# Patient Record
Sex: Female | Born: 1945 | Race: White | Hispanic: No | Marital: Married | Smoking: Never smoker
Health system: Southern US, Community
[De-identification: ages and names within clinical notes are randomized; demographics above are authoritative.]

## PROBLEM LIST (undated history)

## (undated) DIAGNOSIS — R55 Syncope and collapse: Secondary | ICD-10-CM

## (undated) DIAGNOSIS — Z8619 Personal history of other infectious and parasitic diseases: Secondary | ICD-10-CM

## (undated) DIAGNOSIS — M25551 Pain in right hip: Secondary | ICD-10-CM

## (undated) DIAGNOSIS — B159 Hepatitis A without hepatic coma: Secondary | ICD-10-CM

## (undated) DIAGNOSIS — C801 Malignant (primary) neoplasm, unspecified: Secondary | ICD-10-CM

## (undated) DIAGNOSIS — Z974 Presence of external hearing-aid: Secondary | ICD-10-CM

## (undated) DIAGNOSIS — M25512 Pain in left shoulder: Secondary | ICD-10-CM

## (undated) DIAGNOSIS — N814 Uterovaginal prolapse, unspecified: Secondary | ICD-10-CM

## (undated) DIAGNOSIS — M1990 Unspecified osteoarthritis, unspecified site: Secondary | ICD-10-CM

## (undated) DIAGNOSIS — Z9889 Other specified postprocedural states: Secondary | ICD-10-CM

## (undated) DIAGNOSIS — M67929 Unspecified disorder of synovium and tendon, unspecified upper arm: Secondary | ICD-10-CM

## (undated) DIAGNOSIS — U071 COVID-19: Secondary | ICD-10-CM

## (undated) DIAGNOSIS — Z9289 Personal history of other medical treatment: Secondary | ICD-10-CM

## (undated) DIAGNOSIS — M75102 Unspecified rotator cuff tear or rupture of left shoulder, not specified as traumatic: Secondary | ICD-10-CM

## (undated) HISTORY — DX: Unspecified rotator cuff tear or rupture of left shoulder, not specified as traumatic: M75.102

## (undated) HISTORY — DX: Presence of external hearing-aid: Z97.4

## (undated) HISTORY — DX: Malignant (primary) neoplasm, unspecified: C80.1

## (undated) HISTORY — DX: Other specified postprocedural states: Z98.890

## (undated) HISTORY — PX: TONSILLECTOMY AND ADENOIDECTOMY: SUR1326

## (undated) HISTORY — DX: Personal history of other infectious and parasitic diseases: Z86.19

## (undated) HISTORY — DX: COVID-19: U07.1

## (undated) HISTORY — DX: Syncope and collapse: R55

## (undated) HISTORY — DX: Uterovaginal prolapse, unspecified: N81.4

## (undated) HISTORY — PX: REPLACEMENT TOTAL KNEE: SUR1224

## (undated) HISTORY — DX: Personal history of other medical treatment: Z92.89

## (undated) HISTORY — DX: Pain in right hip: M25.551

## (undated) HISTORY — DX: Unspecified disorder of synovium and tendon, unspecified upper arm: M67.929

## (undated) HISTORY — DX: Pain in left shoulder: M25.512

## (undated) HISTORY — PX: TUBAL LIGATION: SHX77

## (undated) HISTORY — DX: Hepatitis a without hepatic coma: B15.9

## (undated) HISTORY — DX: Unspecified osteoarthritis, unspecified site: M19.90

## (undated) HISTORY — PX: MOHS SURGERY: SUR867

---

## 1966-10-27 HISTORY — PX: OVARIAN CYST SURGERY: SHX726

## 1966-10-27 HISTORY — PX: APPENDECTOMY: SHX54

## 1996-10-27 HISTORY — PX: BREAST BIOPSY: SHX20

## 1999-10-28 HISTORY — PX: ROTATOR CUFF REPAIR: SHX139

## 2000-06-03 ENCOUNTER — Ambulatory Visit (HOSPITAL_BASED_OUTPATIENT_CLINIC_OR_DEPARTMENT_OTHER): Admission: RE | Admit: 2000-06-03 | Discharge: 2000-06-04 | Payer: Self-pay | Admitting: Orthopedic Surgery

## 2000-12-23 ENCOUNTER — Other Ambulatory Visit: Admission: RE | Admit: 2000-12-23 | Discharge: 2000-12-23 | Payer: Self-pay | Admitting: Obstetrics and Gynecology

## 2002-02-02 ENCOUNTER — Other Ambulatory Visit: Admission: RE | Admit: 2002-02-02 | Discharge: 2002-02-02 | Payer: Self-pay | Admitting: Obstetrics and Gynecology

## 2003-03-09 ENCOUNTER — Other Ambulatory Visit: Admission: RE | Admit: 2003-03-09 | Discharge: 2003-03-09 | Payer: Self-pay | Admitting: Obstetrics and Gynecology

## 2005-02-07 ENCOUNTER — Ambulatory Visit: Payer: Self-pay | Admitting: Sports Medicine

## 2005-06-03 ENCOUNTER — Ambulatory Visit: Payer: Self-pay | Admitting: Sports Medicine

## 2005-11-28 ENCOUNTER — Ambulatory Visit: Payer: Self-pay | Admitting: Sports Medicine

## 2006-03-12 ENCOUNTER — Other Ambulatory Visit: Admission: RE | Admit: 2006-03-12 | Discharge: 2006-03-12 | Payer: Self-pay | Admitting: Obstetrics & Gynecology

## 2009-01-12 ENCOUNTER — Other Ambulatory Visit: Admission: RE | Admit: 2009-01-12 | Discharge: 2009-01-12 | Payer: Self-pay | Admitting: Obstetrics and Gynecology

## 2011-03-14 NOTE — Op Note (Signed)
[place]  Patient:    Laura Curtis, Laura Curtis                         MRN: [id number] Proc. Date: 06/03/00 Adm. Date:  [id number] Attending:  Twana First                           Operative Report  PREOPERATIVE DIAGNOSES: 1. Status post right shoulder dislocation with subscapularis tendon tear. 2. Right shoulder biceps tendon dislocation.  POSTOPERATIVE DIAGNOSES: 1. Status post right shoulder dislocation with subscapularis tendon tear. 2. Right shoulder biceps tendon dislocation. 3. Right shoulder partial supraspinatus tendon tear.  PROCEDURE: 1. Right shoulder EUA followed by arthroscopic debridement. 2. Right shoulder open subscapularis tendon repair. 3. Right shoulder biceps tenodesis. 4. Right shoulder partial supraspinatus tendon repair. 5. Right shoulder subacromial decompression. 6. Right shoulder distal clavicle excision.  SURGEON:  Elana Alm. Thurston Hole, M.D.  ASSISTANT:  Kirstin Curtis, P.A.  ANESTHESIA:  General.  OPERATIVE TIME:  One hour 40 minutes.  COMPLICATIONS:  None.  INDICATIONS FOR PROCEDURE:  Laura Curtis is a 65 year old woman who sustained a right shoulder dislocation approximately one month ago, with MRI documenting a subscapularis tendon rupture; also, a biceps tendon dislocation; also, impingement and AC joint spurring, and is now to undergo arthroscopy, biceps tenodesis, and subscapularis tendon repair.  DESCRIPTION:  Laura Curtis is brought to the operating room on June 03, 2000 after a supraclavicular block had been placed in the holding room.  She was placed on the operative table in supine position.  After being placed under general anesthesia, her right shoulder was examined under anesthesia.  She had forward flexion to 170, abduction to 160, internal/external rotation decreased to 50 degrees, and a gentle manipulation was carried out, improving range of motion to full.  She had minimal subluxability  noted on the shoulder.  At this point, the shoulder was prepped using sterile Betadine and draped using sterile technique.  She received Ancef 1 g IV preoperatively for prophylaxis.  Initially, through a posterior arthroscopic portal, the arthroscope with a pump attached was placed, and through an anterior portal an arthroscopic probe was placed.  On initial inspection, the articular cartilage and the glenohumeral joint was intact except for a posterior Hill-Sachs lesion.  Her biceps tendon was dislocated medially.  Her subscapularis was ruptured off the lessor tuberosity.  She had a partial 50% undersurface tear of the supraspinatus.  This was partially debrided, as well.  The anterior and inferior glenohumeral ligament complex and anterior and inferior labrum was intact.  The middle and superior labrum was intact.  After the debridement was carried out, then the subacromial space was entered, subtotal bursectomy carried out, subacromial decompression carried out, CA ligament release carried out, and then arthroscopically-assisted distal clavicle excision for significant degenerative changes and spurring in this joint.  At this point, then a 5-7 cm anterior deltoid incision was made for exposure to the anterior aspect of the joint.  The underlying subcutaneous tissues were incised in line with the skin incision, the cephalic vein carefully retracted and exposed, and the deltopectoral groove entered.  Conjoint tendon was carefully protected medially, with protection of musculocutaneous nerve.  The subscapularis tendon rupture was found to be completely ruptured off the lessor tuberosity with dislocation of the biceps medially.  It was felt that with Laura Curtis age and activity level that trying to preserve the biceps tendon  would not be justified.  It would be more functional for her to have a biceps tenodesis, and thus this was performed, cutting the biceps at the level of its  attachment to the superior glenoid and then placing multiple mattress #2 sutures through it, and then making a small keyhole in the bicipital groove and tying this down.  After this was done, then the same sutures were then also used to put through the subscapularis tendon medially and tying this down to the lesser tuberosity.  An Arthrex suture anchor was placed in the lesser tuberosity as well, and each of these #2 sutures were placed through the subscapularis tendon also and tied down also, thus further securing the subscapularis tendon back down to the lessor tuberosity.  After this was done, the shoulder could be brought through a range of motion with no impingement on the repair.  The wound was irrigated and then closed using 2-0 Vicryl and 3-0 Prolene.  Steri-Strips were applied.  Sterile dressings were applied and a sling, and then the patient awakened and taken to the recovery room in a stable condition.  Needle and sponge counts correct x 2 at the end of the case.  FOLLOW-UP CARE:  Laura Curtis will be followed overnight in the recovery care center for IV pain control and neurovascular monitoring.  Discharge tomorrow on Percocet for pain, see her back in my office in a week for sutures out and follow-up. DD:  06/03/00 TD:  06/03/00 Job: [id number]

## 2011-12-31 ENCOUNTER — Ambulatory Visit (INDEPENDENT_AMBULATORY_CARE_PROVIDER_SITE_OTHER): Payer: Medicare Other | Admitting: Sports Medicine

## 2011-12-31 VITALS — BP 114/76 | Ht 64.0 in | Wt 130.0 lb

## 2011-12-31 DIAGNOSIS — M25551 Pain in right hip: Secondary | ICD-10-CM | POA: Insufficient documentation

## 2011-12-31 DIAGNOSIS — M25512 Pain in left shoulder: Secondary | ICD-10-CM

## 2011-12-31 DIAGNOSIS — M25559 Pain in unspecified hip: Secondary | ICD-10-CM

## 2011-12-31 DIAGNOSIS — M25519 Pain in unspecified shoulder: Secondary | ICD-10-CM

## 2011-12-31 HISTORY — DX: Pain in right hip: M25.551

## 2011-12-31 HISTORY — DX: Pain in left shoulder: M25.512

## 2011-12-31 MED ORDER — MELOXICAM 7.5 MG PO TABS: ORAL_TABLET | ORAL | Status: AC

## 2011-12-31 NOTE — Assessment & Plan Note (Signed)
Her examination was not particularly remarkable. With this in mind -- We gave her a rotator cuff rehabilitation program that she can start at home after doing simple motion exercises. She will use moderate resistance thereband.  Mobic once daily for 2 weeks  If she does not see a reduction in pain in 2 weeks I would like her to return for ultrasound of the shoulder

## 2011-12-31 NOTE — Progress Notes (Signed)
  Subjective:    Patient ID: Laura Curtis, female    DOB: [birth year], 66 y.o.   MRN: [id number]  HPI Patient enters complaining of right hip pain This occurred after she was sitting on the floor doing some painting She placed this about twice a week and then noted some pain particularly when going to the right She has less pain on her backhand stroke and on turning to her left  Left shoulder pain seemed to occur spontaneously a couple months ago She has had a right rotator cuff tear and surgery and states that the tightness feels similar She does get some nighttime pain but not severe Aleve did not offer much pain relief   Review of Systems     Objective:   Physical Exam  No acute distress  Right hip shows a total of 60 of rotational motion with 25 of internal rotation Left hip shows 80 of rotational motion with 30 of internal rotation The sacroiliac joints move well Test is completely normal on the left and on the right is normal to flexion adduction and rotation. It is very weak to abduction  Left shoulder exam shows a full range of motion but she feels some tightness and mild pain at full elevation and full flexion Strength is good on internal and external rotation Strength is good abduction Initiation of abduction and empty can position did cause some mild pain      Assessment & Plan:

## 2011-12-31 NOTE — Assessment & Plan Note (Signed)
She is given a series of standard hip exercises to work particularly on her abduction strength and some of her hip rotation strength  In addition she has significant tightness and pain with crossover stretch so we will begin this  She should do this exercises faithfully for 6 weeks and then recheck

## 2012-01-14 ENCOUNTER — Ambulatory Visit (INDEPENDENT_AMBULATORY_CARE_PROVIDER_SITE_OTHER): Payer: Medicare Other | Admitting: Sports Medicine

## 2012-01-14 VITALS — BP 100/60 | Ht 64.0 in | Wt 130.0 lb

## 2012-01-14 DIAGNOSIS — M75102 Unspecified rotator cuff tear or rupture of left shoulder, not specified as traumatic: Secondary | ICD-10-CM

## 2012-01-14 DIAGNOSIS — M25519 Pain in unspecified shoulder: Secondary | ICD-10-CM

## 2012-01-14 DIAGNOSIS — M67929 Unspecified disorder of synovium and tendon, unspecified upper arm: Secondary | ICD-10-CM

## 2012-01-14 DIAGNOSIS — S43429A Sprain of unspecified rotator cuff capsule, initial encounter: Secondary | ICD-10-CM

## 2012-01-14 DIAGNOSIS — M7520 Bicipital tendinitis, unspecified shoulder: Secondary | ICD-10-CM

## 2012-01-14 DIAGNOSIS — M25512 Pain in left shoulder: Secondary | ICD-10-CM

## 2012-01-14 HISTORY — DX: Unspecified disorder of synovium and tendon, unspecified upper arm: M67.929

## 2012-01-14 HISTORY — DX: Unspecified rotator cuff tear or rupture of left shoulder, not specified as traumatic: M75.102

## 2012-01-14 MED ORDER — NITROGLYCERIN 0.2 MG/HR TD PT24: MEDICATED_PATCH | TRANSDERMAL | Status: DC

## 2012-01-14 NOTE — Progress Notes (Signed)
  Subjective:    Patient ID: Laura Curtis, female    DOB: 1946-02-05, 66 y.o.   MRN: [id number]  HPI Patient returns to clinic with worsening left shoulder pain. She states pain has been present for about 9 weeks now.she has been doing her exercises as provided before but believes the pain is worse today. She cannot abduct her arm. She is taking Mobic without any relief. She is icing daily.   Review of Systems No fevers or chills.     Objective:   Physical Exam Shoulder Exam: Left shoulder Appearance: Normal Pain on palpation: Inferior shoulder.  None to [facility] joint or humeral head.  ROM: Abduction Decreased ROM due to pain.   Internal and external rotation good without pain, strength 4/5.   Empty Can:    Hawkin's: some pain causing limitation  Neers: uanble to complete due to pain   Speeds:unable to complete due to pain Yergason's test: unable to complete due to pain Compression:  No pain   MSK U/S:  Left shoulder:  Small tear noted Right supraspinatus/ bursal surface and subscap tendons. In subscap this looks indistinct and degenrative with some calcification but there is increased doppler flow.   Edema noted around biceps tendon with some peripheral tearing of fibers.  Infraspinatus and teres minor WNL.  AC joint normal.      Assessment & Plan:

## 2012-01-14 NOTE — Assessment & Plan Note (Signed)
Will gradually add some strength work Avoid lifting

## 2012-01-14 NOTE — Assessment & Plan Note (Signed)
This is worsening and no response to [facility]  OK to use aleve   Add some easy motion exercises but stop RC strength work until reck

## 2012-01-14 NOTE — Progress Notes (Deleted)
  Subjective:    Patient ID: Laura Curtis, female    DOB: [birth year], 66 y.o.   MRN: [id number]  HPI  Left shoulder pain:  Followup since previous visit.  Worsening in pain and decreased range of motion.  She has been   Review of Systems     Objective:   Physical Exam  Shoulder Exam:  Appearan     Assessment & Plan:

## 2012-01-15 ENCOUNTER — Encounter: Payer: Self-pay | Admitting: Sports Medicine

## 2012-02-16 ENCOUNTER — Telehealth: Payer: Self-pay

## 2012-02-16 NOTE — Telephone Encounter (Signed)
Tell her that I would prefer that she followup in the appointment center of [facility] with Dr. Audria Nine to establish care for someone who is in the Medicare age group/let her know i'm focusing my practice on adolescents now

## 2012-02-16 NOTE — Telephone Encounter (Signed)
Message for Dr. Merla Riches- This patient would like to schedule a complete physical with you and she hasn't seen you since September of 2009 (one time) and once in 2003 for an OV.  Please advise if this patient should be added to your appointment schedule for a physical.

## 2012-02-16 NOTE — Telephone Encounter (Signed)
Pull chart please 

## 2012-02-16 NOTE — Telephone Encounter (Signed)
Chart in your box 

## 2012-04-15 ENCOUNTER — Ambulatory Visit (INDEPENDENT_AMBULATORY_CARE_PROVIDER_SITE_OTHER): Payer: Medicare Other | Admitting: Emergency Medicine

## 2012-04-15 VITALS — BP 112/76 | HR 69 | Temp 97.8°F | Resp 16 | Ht 65.0 in | Wt 135.0 lb

## 2012-04-15 DIAGNOSIS — IMO0002 Reserved for concepts with insufficient information to code with codable children: Secondary | ICD-10-CM

## 2012-04-15 DIAGNOSIS — R55 Syncope and collapse: Secondary | ICD-10-CM

## 2012-04-15 LAB — POCT CBC
Granulocyte percent: 64.9 %G (ref 37–80)
Hemoglobin: 14.1 g/dL (ref 12.2–16.2)
MCH, POC: 29.4 pg (ref 27–31.2)
MCV: 89.1 fL (ref 80–97)
MPV: 7 fL (ref 0–99.8)
POC MID %: 5.8 %M (ref 0–12)
RBC: 4.8 M/uL (ref 4.04–5.48)
WBC: 6.4 10*3/uL (ref 4.6–10.2)

## 2012-04-15 LAB — COMPREHENSIVE METABOLIC PANEL
ALT: 11 U/L (ref 0–35)
Albumin: 4 g/dL (ref 3.5–5.2)
CO2: 29 mEq/L (ref 19–32)
Calcium: 9.6 mg/dL (ref 8.4–10.5)
Chloride: 102 mEq/L (ref 96–112)
Creat: 0.65 mg/dL (ref 0.50–1.10)
Potassium: 4 mEq/L (ref 3.5–5.3)

## 2012-04-15 LAB — TSH: TSH: 3.088 u[IU]/mL (ref 0.350–4.500)

## 2012-04-15 NOTE — Progress Notes (Signed)
Subjective:    Patient ID: Laura Curtis, female    DOB: [birth year], 66 y.o.   MRN: [id number]  HPI patient enters with an episode on Sunday when she was out early in the morning work in the yard and leaned over and had but she feels was a syncopal episode. Since then she has felt foggy in her head. She just does not feel quite right. She has had no further syncopal episodes but she does feel weak. She denies any chest pain or palpitations the she denies any GI symptoms she is not currently on any medications.    Review of Systems     Objective:   Physical Exam  Constitutional: She is oriented to person, place, and time. She appears well-developed and well-nourished.  HENT:  Head: Normocephalic.  Eyes: EOM are normal. Pupils are equal, round, and reactive to light.  Neck: No thyromegaly present.  Cardiovascular: Normal rate, regular rhythm and intact distal pulses.  Exam reveals friction rub. Exam reveals no gallop.   No murmur heard. Pulmonary/Chest: Effort normal and breath sounds normal. No respiratory distress. She has no wheezes. She has no rales. She exhibits no tenderness.  Neurological: She is alert and oriented to person, place, and time. She has normal reflexes. No cranial nerve deficit. Coordination normal.  Psychiatric: She has a normal mood and affect. Her behavior is normal.   EKG normal sinus rhythm no acute change   Results for orders placed in visit on 04/15/12  POCT CBC      Component Value Range   WBC 6.4  4.6 - 10.2 K/uL   Lymph, poc 1.9  0.6 - 3.4   POC LYMPH PERCENT 29.3  10 - 50 %L   MID (cbc) 0.4  0 - 0.9   POC MID % 5.8  0 - 12 %M   POC Granulocyte 4.2  2 - 6.9   Granulocyte percent 64.9  37 - 80 %G   RBC 4.80  4.04 - 5.48 M/uL   Hemoglobin 14.1  12.2 - 16.2 g/dL   HCT, POC 78.2  95.6 - 47.9 %   MCV 89.1  80 - 97 fL   MCH, POC 29.4  27 - 31.2 pg   MCHC 32.9  31.8 - 35.4 g/dL   RDW, POC 21.3     Platelet Count, POC 325  142 - 424 K/uL   MPV 7.0  0  - 99.8 fL   Results for orders placed in visit on 04/15/12  POCT CBC      Component Value Range   WBC 6.4  4.6 - 10.2 K/uL   Lymph, poc 1.9  0.6 - 3.4   POC LYMPH PERCENT 29.3  10 - 50 %L   MID (cbc) 0.4  0 - 0.9   POC MID % 5.8  0 - 12 %M   POC Granulocyte 4.2  2 - 6.9   Granulocyte percent 64.9  37 - 80 %G   RBC 4.80  4.04 - 5.48 M/uL   Hemoglobin 14.1  12.2 - 16.2 g/dL   HCT, POC 08.6  57.8 - 47.9 %   MCV 89.1  80 - 97 fL   MCH, POC 29.4  27 - 31.2 pg   MCHC 32.9  31.8 - 35.4 g/dL   RDW, POC 46.9     Platelet Count, POC 325  142 - 424 K/uL   MPV 7.0  0 - 99.8 fL  GLUCOSE, POCT (MANUAL RESULT ENTRY)  Component Value Range   POC Glucose 84  70 - 99 mg/dl      Assessment & Plan:    Patient had a syncopal episode on Sunday. She has not felt well since that time her blood pressure is consistently low and not truly orthostatic on her she could have some type of autonomic dysfunction. I will refer her to Dr. Myrtis Ser cardiologist for his evaluation of this we'll also check a CT of the head to be sure there is no CNS pathology.. routine labs were done

## 2012-04-19 ENCOUNTER — Other Ambulatory Visit: Payer: Medicare Other

## 2012-04-19 ENCOUNTER — Telehealth: Payer: Self-pay | Admitting: Emergency Medicine

## 2012-04-19 ENCOUNTER — Encounter: Payer: Self-pay | Admitting: *Deleted

## 2012-04-19 ENCOUNTER — Ambulatory Visit
Admission: RE | Admit: 2012-04-19 | Discharge: 2012-04-19 | Disposition: A | Discharge status: Home / Self Care | Payer: Medicare Other | Source: Ambulatory Visit | Attending: Emergency Medicine | Admitting: Emergency Medicine

## 2012-04-19 ENCOUNTER — Encounter: Payer: Self-pay | Admitting: Cardiovascular Disease

## 2012-04-19 DIAGNOSIS — IMO0002 Reserved for concepts with insufficient information to code with codable children: Secondary | ICD-10-CM

## 2012-04-19 DIAGNOSIS — R55 Syncope and collapse: Secondary | ICD-10-CM

## 2012-04-19 MED ORDER — IOHEXOL 300 MG/ML  SOLN
75.0000 mL | Freq: Once | INTRAMUSCULAR | Status: AC | PRN
  Administered 2012-04-19: 75 mL via INTRAVENOUS

## 2012-04-19 NOTE — Telephone Encounter (Signed)
Please be sure we called Aurea Graff and let her know her CT is normal and ask her when her appointment with the cardiologist is.

## 2012-04-20 ENCOUNTER — Ambulatory Visit: Payer: Medicare Other | Admitting: Cardiovascular Disease

## 2012-04-21 NOTE — Telephone Encounter (Signed)
LMOM telling pt that I was checking to make sure she had gotten the message from Dr Cleta Alberts about nl CT and asked for CB if she has any ?s or problems getting cardiologist appt.

## 2012-04-22 ENCOUNTER — Encounter: Payer: Self-pay | Admitting: Cardiology

## 2012-04-22 ENCOUNTER — Ambulatory Visit (INDEPENDENT_AMBULATORY_CARE_PROVIDER_SITE_OTHER): Payer: Medicare Other | Admitting: Cardiology

## 2012-04-22 VITALS — BP 104/68 | HR 71 | Ht 65.0 in | Wt 135.0 lb

## 2012-04-22 DIAGNOSIS — R55 Syncope and collapse: Secondary | ICD-10-CM

## 2012-04-22 NOTE — Progress Notes (Signed)
   HPI Patient is seen today in consultation for the evaluation of a syncopal episode. On April 11, 2012 the patient went out early to work in her yard. As she leaned over she began to feel lightheaded. She felt that she did have a syncopal episode. She was not injured. She went inside and rested and then tried to do some more work and did not feel well. Over the next day or 2 she improved and she is now back to normal. [facility] He did very nice evaluation. Blood studies showed no significant abnormalities. Head CT revealed no significant abnormality. She was referred for cardiac evaluation.  The patient is extremely active. She does  Extensive yard work without any problems. She does not have palpitations. She plays tennis without difficulties. Once in the past when dehydrated she felt poorly. There is no family history of syncope or presyncope. There is no family history of sudden cardiac death.  No Known Allergies  No current outpatient prescriptions on file.    History   Social History  . Marital Status: Married    Spouse Name: N/A    Number of Children: N/A  . Years of Education: N/A   Occupational History  . Not on file.   Social History Main Topics  . Smoking status: Never Smoker   . Smokeless tobacco: Not on file  . Alcohol Use: Not on file  . Drug Use: Not on file  . Sexually Active: Not on file   Other Topics Concern  . Not on file   Social History Narrative  . No narrative on file    No family history on file.  Past Medical History  Diagnosis Date  . Hip pain, right 12/31/2011    A mild amount of arthritis is probable with the limitation of rotation but the key finding today was weakness in abduction   . Shoulder pain, left 12/31/2011    History of rotator cuff tear on the right and now has nontraumatic left shoulder pain   . Left rotator cuff tear 01/14/2012    Supraspinatus tear noted on ultrasound. Provided instructions regarding  nitroglycerin patches 1/4 patch daily to shoulder.   To continue circular motion exercises and range of motion. No over the head exercises.  No weight bearing on that side. She can continue to play tennis as tolerated.  FU in 2 weeks if no improvement, 4 weeks if improved.      . Biceps tendinopathy 01/14/2012  . Syncope     June, 2013    No past surgical history on file.  ROS   Patient denies fever, chills, headache, sweats, rash, change in vision, change in hearing, chest pain, cough, nausea vomiting, urinary symptoms. All other systems are reviewed and are negative.  PHYSICAL EXAM   Patient is extremely healthy-appearing female. She is oriented to person time and place. Affect is normal. There is no jugular venous distention. There no carotid bruits. Lungs are clear. Respiratory effort is nonlabored. Cardiac exam reveals S1 and S2. There no clicks or significant murmurs. Abdomen is soft. There is no peripheral edema. There no musculoskeletal deformities. There are no skin rashes.  Filed Vitals:   04/22/12 1517  BP: 104/68  Pulse: 71  Height: 5\' 5"  (1.651 m)  Weight: 135 lb (61.236 kg)  SpO2: 97%   I reviewed her EKG that was done on April 15, 2012. It is normal.  ASSESSMENT & PLAN

## 2012-04-22 NOTE — Assessment & Plan Note (Signed)
The patient had a spell on June 16. She felt weak in her garden and feels that she did have syncope. There was no injury. She was able to get herself up and going to her home. She felt weak for the next day or so and then improved back to normal. She has had a head CT showing no significant abnormality. She has no significant lab abnormalities. It is most likely that this episode was related to mild volume depletion. She is careful and keeps her fluid intake up. However she does limit her salt. She has a low blood pressure in general. In her case increasing her salt intake will be appropriate. I carefully considered whether she needs more workup. There is no evidence of any significant cardiac disease. I have recommended no further cardiac workup. I have recommended that she liberalize her salt intake and be sure that she keeps her volume status up. No further workup is planned at this time.

## 2012-04-22 NOTE — Patient Instructions (Addendum)
Your physician recommends that you schedule a follow-up appointment in: No follow up needed.  Call us if you need [place] at [phone].

## 2012-04-27 ENCOUNTER — Encounter: Payer: Self-pay | Admitting: Cardiology

## 2012-04-27 NOTE — Progress Notes (Signed)
   The patient asked if I would help her find a primary care physician. I spoke with Dr. Berniece Andreas in our [place] office. She said she would be willing to take the patient. I've spoken with the patient and instructed her to call [place] office requesting a new patient evaluation with Dr. Fabian Sharp. Everyone is aware that the patient is stable in that there is no urgency.

## 2012-05-05 ENCOUNTER — Ambulatory Visit (INDEPENDENT_AMBULATORY_CARE_PROVIDER_SITE_OTHER): Payer: Medicare Other | Admitting: Sports Medicine

## 2012-05-05 VITALS — BP 100/60

## 2012-05-05 DIAGNOSIS — S43429A Sprain of unspecified rotator cuff capsule, initial encounter: Secondary | ICD-10-CM

## 2012-05-05 DIAGNOSIS — M75102 Unspecified rotator cuff tear or rupture of left shoulder, not specified as traumatic: Secondary | ICD-10-CM

## 2012-05-05 MED ORDER — NITROGLYCERIN 0.2 MG/HR TD PT24: MEDICATED_PATCH | TRANSDERMAL | Status: DC

## 2012-05-05 NOTE — Patient Instructions (Addendum)
Very nice to meet you Wear the nitro patch 1/2 patch daily.  If you get a headache please go to a quarter of the patch and then try to increase to half a patch as tolerated.  Please do the exercises and stretches we gave you.  Try to do them daily. You can use weights but do not go over 2-3#'s. We will see you again in 2 months and see how you are doing.

## 2012-05-05 NOTE — Progress Notes (Signed)
  Subjective:    Patient ID: Laura Curtis, female    DOB: [birth year], 66 y.o.   MRN: [id number]  HPI Patient returns to clinic withleft shoulder pain. Patient was diagnosed with a left rotator cuff tear at last visit. Patient though has not followed up for greater than 12 weeks. Patient states that she has increased her range of motion but still feels weak in that arm. Patient states the movements that seem to be the weakest visit she uses any strength with abduction or forward flexion. Patient states that it is somewhat painful from time to time but no radiation of pain and no weakness.patient was using nitroglycerin patch but she discontinued this about 2 months ago when she had improvement with less pain.  Even now pain is much less than last visit. . Patient had started doing more activities such as playing tennis again which she started 3 weeks ago but notices at the end of the second set after serving multiple times her left shoulder seems to ache. She plays rt handed so this is toss arm.  Review of Systems No fevers or chills.     Objective:   Physical Exam Shoulder Exam: Left shoulder Appearance: Normal Pain on palpation: Inferior shoulder.  None to [facility] joint or humeral head.  ROM: Abduction Decreased ROM due to pain90.patient though is able to go full range of motion and passive.   Internal and external rotation good without pain, strength 4/5.   Empty Can:   Positive significantly with weakness on the left Hawkin's: mild pain  Neers: negative Speeds:negative Yergason's test: negative Compression:  No pain   MSK U/S:  Left shoulder:  Small tear noted Right supraspinatus/ bursal surface and infraspinatus.. In infraspinatus this looks indistinct and degenrative with some calcification but there is increased doppler flow.   Edema noted around biceps tendon with some peripheral tearing of fibers.most of the fluid appears to be also around the a.c. Joint with a positive mushroom sign,  this likely is swelling from the rotator cuff tear.  Infraspinatus does have a very small mild tear inferior aspect near insertion. Teres minor WNL.  AC joint mild arthritis. On impingement view patient does show signs of having impingement.      Assessment & Plan:

## 2012-05-05 NOTE — Assessment & Plan Note (Signed)
Restarted nitroglycerin patch Nitro patch with increasing to half patch if having no side effects. Gave increasing range of motion exercises as well as stretching to do before working out. Encourage no overhead exercises to start some very mild weights of 1-2 pounds if once in the abduction. Follow up in 8 weeks for reevaluationat that time if she is still having pain we will need to consider either formal physical therapy or other modalities.

## 2012-07-21 ENCOUNTER — Ambulatory Visit (INDEPENDENT_AMBULATORY_CARE_PROVIDER_SITE_OTHER): Payer: Medicare Other | Admitting: Internal Medicine

## 2012-07-21 ENCOUNTER — Encounter: Payer: Self-pay | Admitting: Internal Medicine

## 2012-07-21 VITALS — BP 98/70 | HR 80 | Temp 98.5°F | Ht 64.75 in | Wt 133.0 lb

## 2012-07-21 DIAGNOSIS — Z7189 Other specified counseling: Secondary | ICD-10-CM

## 2012-07-21 DIAGNOSIS — Z9889 Other specified postprocedural states: Secondary | ICD-10-CM

## 2012-07-21 DIAGNOSIS — Z87898 Personal history of other specified conditions: Secondary | ICD-10-CM

## 2012-07-21 DIAGNOSIS — Z9289 Personal history of other medical treatment: Secondary | ICD-10-CM

## 2012-07-21 DIAGNOSIS — R55 Syncope and collapse: Secondary | ICD-10-CM

## 2012-07-21 DIAGNOSIS — Z85828 Personal history of other malignant neoplasm of skin: Secondary | ICD-10-CM

## 2012-07-21 DIAGNOSIS — Z13220 Encounter for screening for lipoid disorders: Secondary | ICD-10-CM

## 2012-07-21 DIAGNOSIS — Z228 Carrier of other infectious diseases: Secondary | ICD-10-CM

## 2012-07-21 NOTE — Progress Notes (Signed)
Subjective:    Patient ID: Jeronimo Norma, female    DOB: May 04, 1946, 66 y.o.   MRN: [id number]  HPI Patient comes in today as a new patient visit. Her previous care was from [facility].  She is generally been in good health but had an episode of syncope in the summer of uncertain cause evaluated by Dr. Myrtis Ser and not felt to be cardiac in nature. It was chopped up to hydration and heat issues. She is. Physically active has never had cardiac neurologic problems. She's under care for tendinopathy is from Dr. Darrick Penna. She sees her gynecologist once a year or every other year. She sees her dermatologist Dr. 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  She tries to avoid invasive procedures and has not had a colonoscopy at this time. She's never had a flu shot but is interested in the Pneumovax and the Zostavax.  She is a retired Scientist, forensic Review of Systems ROS:  GEN/ HEENT: No fever, significant weight changes sweats headaches vision problems hearing changes, CV/ PULM; No chest pain shortness of breath cough, ,edema  change in exercise tolerance. GI /GU: No adominal pain, vomiting, change in bowel habits. No blood in the stool. No significant GU symptoms. SKIN/HEME: ,no acute skin rashes suspicious lesions or bleeding. No lymphadenopathy, nodules, masses.  NEURO/ PSYCH:  No neurologic signs such as weakness numbness. No depression anxiety. IMM/ Allergy: No unusual infections.  Allergy .   REST of 12 system review negative except as per HPI Past Medical History  Diagnosis Date  . Hip pain, right 12/31/2011    A mild amount of arthritis is probable with the limitation of rotation but the key finding today was weakness in abduction   . Shoulder pain, left 12/31/2011    History of rotator cuff tear on the right and now has nontraumatic left shoulder pain   . Left rotator cuff tear 01/14/2012    Supraspinatus tear noted on ultrasound. Provided  instructions regarding nitroglycerin patches 1/4 patch daily to shoulder.   To continue circular motion exercises and range of motion. No over the head exercises.  No weight bearing on that side. She can continue to play tennis as tolerated.  FU in 2 weeks if no improvement, 4 weeks if improved.      . Biceps tendinopathy 01/14/2012  . Syncope     June, 2013  . 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  [name] Hx of varicella   . HX: benign breast biopsy     History   Social History  . Marital Status: Married    Spouse Name: N/A    Number of Children: N/A  . Years of Education: N/A   Occupational History  . Not on file.   Social History Main Topics  . Smoking status: Never Smoker   . Smokeless tobacco: Not on file  . Alcohol Use: Yes  . Drug Use: Not on file  . Sexually Active: Not on file   Other Topics Concern  . 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 Sees dentist on a regular basis. etoh 3 x per week     Past Surgical History  Procedure Date  . Appendectomy 1968  .  Tonsilectomy, adenoidectomy, bilateral myringotomy and tubes 1960  . Rotator cuff repair 2001  . Breast biopsy 1998    Family History  Problem Relation Age of Onset  . Stroke Father     age 27  . Other Mother     old age died 85     No Known Allergies  Current Outpatient Prescriptions on File Prior to Visit  Medication Sig Dispense Refill  . nitroGLYCERIN (NITRO-DUR) 0.2 mg/hr Apply 1/4 patch to affected shoulder every 24 hours.  Do not put in same place each day.  30 patch  12       Objective:   Physical Exam BP 98/70  Pulse 80  Temp 98.5 F (36.9 C) (Oral)  Ht 5' 4.75" (1.645 m)  Wt 133 lb (60.328 kg)  BMI 22.30 kg/m2  SpO2 96% Well-developed well-nourished in no acute distress HEENT normocephalic OP clear  tongue midline teeth in good repair  neck supple without masses or adenopathy  eyes clear EOMs full Chest clear to auscultation normal respirations Heart he Axis I S2 no gallops or murmurs  abdomen soft without organomegaly guarding or rebound extremities negative CCE Skin no active bruising or bleeding. Nl turgor  Oriented x 3 and no noted deficits in memory, attention, and speech. Neuro seems intact gait normal  Lab Results  Component Value Date   WBC 6.4 04/15/2012   HGB 14.1 04/15/2012   HCT 42.8 04/15/2012   GLUCOSE 98 04/15/2012   ALT 11 04/15/2012   AST 14 04/15/2012   NA 140 04/15/2012   K 4.0 04/15/2012   CL 102 04/15/2012   CREATININE 0.65 04/15/2012   BUN 17 04/15/2012   CO2 29 04/15/2012   TSH 3.088 04/15/2012    Reviewed notes from urgent emergency evaluation and also Dr. Myrtis Ser.     Assessment & Plan:   History of syncope possibly heat related and hydration related. Not felt to be cardiac in nature no obvious neurologic findings or events. After patient had left noted that she had been prescribed nitroglycerin topical small amounts for her to stop these. Uncertain if she had been using at before the syncope which would help explain the factors and what happened. prevention Her family is long-lived father died of a stroke she is probably never had her lipids checked and is interested in that. She has not been interested in colonoscopy at this time has no symptoms family history of cancer or other risk. She is willing to do stool Hemoccults at this time for screening reconsider at some other time. In regard to immunizations; has some hesitations about the flu vaccine will get the Pneumovax and Zostavax at her lab appointment.  Discussed advisability the flu vaccine. Low risk higher benefit. UTD on pap   History of squamous cell cancer followed by dermatology. Skin protection  Bone health ; sheSpends a good deal of time outside and drinks dairy products products no family history  of osteoporosis.  Under care for various ms conditions per Dr fields is very active and well.  Total visit 30 mins > 50% spent counseling and coordinating care

## 2012-07-21 NOTE — Patient Instructions (Signed)
return for fasting labs  And immunizations then pneumovax and zostavax.  Would still encourage the flu vaccine.  If labs  Ok then ov in a year. Or as needed.  Welcome to the practice.

## 2012-07-27 ENCOUNTER — Ambulatory Visit (INDEPENDENT_AMBULATORY_CARE_PROVIDER_SITE_OTHER): Payer: Medicare Other | Admitting: Family Medicine

## 2012-07-27 DIAGNOSIS — Z23 Encounter for immunization: Secondary | ICD-10-CM

## 2012-07-27 DIAGNOSIS — Z13220 Encounter for screening for lipoid disorders: Secondary | ICD-10-CM

## 2012-07-27 DIAGNOSIS — Z87898 Personal history of other specified conditions: Secondary | ICD-10-CM

## 2012-07-27 DIAGNOSIS — Z79899 Other long term (current) drug therapy: Secondary | ICD-10-CM

## 2012-07-27 DIAGNOSIS — Z0000 Encounter for general adult medical examination without abnormal findings: Secondary | ICD-10-CM

## 2012-07-27 LAB — LDL CHOLESTEROL, DIRECT: Direct LDL: 138.7 mg/dL

## 2012-07-27 LAB — TSH: TSH: 3.16 u[IU]/mL (ref 0.35–5.50)

## 2012-07-27 LAB — T4, FREE: Free T4: 0.7 ng/dL (ref 0.60–1.60)

## 2012-07-28 ENCOUNTER — Telehealth: Payer: Self-pay | Admitting: Family Medicine

## 2012-07-28 NOTE — Telephone Encounter (Signed)
The patient came in for CPE labs on 07/27/12.  While here she declined all labs except lipid, tsh and t4.  She felt they were unnecessary.  She had a CBC done at an urgent care in July that she will bring to her next ov.

## 2012-07-28 NOTE — Telephone Encounter (Signed)
Do not understand the message but I agree that she does not need cpx labs just the thyroid and lipid panel  Agree that other tests not necessary at this time.

## 2012-08-02 ENCOUNTER — Other Ambulatory Visit (INDEPENDENT_AMBULATORY_CARE_PROVIDER_SITE_OTHER): Payer: Medicare Other

## 2012-08-02 DIAGNOSIS — Z0000 Encounter for general adult medical examination without abnormal findings: Secondary | ICD-10-CM

## 2012-08-02 LAB — HEMOCCULT GUIAC POC 1CARD (OFFICE): Fecal Occult Blood, POC: NEGATIVE

## 2013-01-11 ENCOUNTER — Encounter: Payer: Self-pay | Admitting: Internal Medicine

## 2013-01-11 ENCOUNTER — Ambulatory Visit (INDEPENDENT_AMBULATORY_CARE_PROVIDER_SITE_OTHER): Payer: Medicare Other | Admitting: Internal Medicine

## 2013-01-11 ENCOUNTER — Telehealth: Payer: Self-pay | Admitting: Internal Medicine

## 2013-01-11 VITALS — BP 108/80 | HR 62 | Temp 97.9°F | Wt 133.0 lb

## 2013-01-11 DIAGNOSIS — H00029 Hordeolum internum unspecified eye, unspecified eyelid: Secondary | ICD-10-CM | POA: Insufficient documentation

## 2013-01-11 DIAGNOSIS — H00023 Hordeolum internum right eye, unspecified eyelid: Secondary | ICD-10-CM

## 2013-01-11 DIAGNOSIS — H019 Unspecified inflammation of eyelid: Secondary | ICD-10-CM | POA: Insufficient documentation

## 2013-01-11 MED ORDER — ERYTHROMYCIN 5 MG/GM OP OINT: TOPICAL_OINTMENT | Freq: Four times a day (QID) | OPHTHALMIC | Status: DC

## 2013-01-11 MED ORDER — DOXYCYCLINE HYCLATE 100 MG PO CAPS: 100.0000 mg | ORAL_CAPSULE | Freq: Two times a day (BID) | ORAL | Status: DC

## 2013-01-11 NOTE — Patient Instructions (Addendum)
This is an ee lid infection with probably internal stye.  Continue warm compresses  At least 3-4 x per day.  And eye ointment antibiotic  See ophthalmologist  If not a lot better in the next 2 days .

## 2013-01-11 NOTE — Telephone Encounter (Signed)
Patient Information:  Caller Name: Harlie  Phone: [phone]  Patient: Laura Curtis, Laura Curtis  Gender: Female  DOB: 1946/07/11  Age: 67 Years  PCP: Berniece Andreas (Family Practice)  Office Follow Up:  Does the office need to follow up with this patient?: No  Instructions For The Office: N/A   Symptoms  Reason For Call & Symptoms: Swollen eye lid, worse in the morning.  There is a nodule on the upper area.  No fever. No change in her vision.  Reviewed Health History In EMR: Yes  Reviewed Medications In EMR: Yes  Reviewed Allergies In EMR: Yes  Reviewed Surgeries / Procedures: Yes  Date of Onset of Symptoms: 01/04/2013  Guideline(s) Used:  Eye Pain  Disposition Per Guideline:   Go to Office Now  Reason For Disposition Reached:   Painful rash near eye and multiple small blisters grouped together  Advice Given:  N/A  Patient Will Follow Care Advice:  YES  Appointment Scheduled:  01/11/2013 08:30:00 Appointment Scheduled Provider:  Berniece Andreas (Family Practice)

## 2013-01-11 NOTE — Telephone Encounter (Signed)
No follow up required, closing encounter. °

## 2013-01-11 NOTE — Progress Notes (Signed)
Chief Complaint  Patient presents with  . Rash on rt eye    Started over the weekend    HPI: Patient comes in today for SDA for  new problem evaluation. Onset about a week ago   Like a stye and then discolloerd again and then amtter around eye and bathing in warm water  Helps for 2 days.   Is getting worse film around I redness and tender no fever ROS: See pertinent positives and negatives per HPI. URI in the past but nothing recently no cough swollen glands.  Last eye check was with the doctor who did Lasix surgery. Dr. Delaney Meigs  Past Medical History  Diagnosis Date  . Hip pain, right 12/31/2011    A mild amount of arthritis is probable with the limitation of rotation but the key finding today was weakness in abduction   . Shoulder pain, left 12/31/2011    History of rotator cuff tear on the right and now has nontraumatic left shoulder pain   . Left rotator cuff tear 01/14/2012    Supraspinatus tear noted on ultrasound. Provided instructions regarding nitroglycerin patches 1/4 patch daily to shoulder.   To continue circular motion exercises and range of motion. No over the head exercises.  No weight bearing on that side. She can continue to play tennis as tolerated.  FU in 2 weeks if no improvement, 4 weeks if improved.      . Biceps tendinopathy 01/14/2012  . Syncope     June, 2013  . 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  [name] Hx of varicella   . HX: benign breast biopsy     Family History  Problem Relation Age of Onset  . Stroke Father     age 59  . Other Mother     old age died 61     History   Social History  . Marital Status: Married    Spouse Name: N/A    Number of Children: N/A  . Years of Education: N/A   Social History Main Topics  . Smoking status: Never Smoker   . Smokeless tobacco: None  . Alcohol Use: Yes  . Drug Use: None  . Sexually Active: None   Other Topics Concern  . None   Social History Narrative   hh of 2 married   Emergency planning/management officer    retired Scientist, forensic.   [place] 24 hears    From [place]   G4[place]3   Active  Heavy gardening exercise    Wears seat belts , no firearms , ets, tanning beds . Sees dentist on a regular basis.    etoh 3 x per week           Outpatient Encounter Prescriptions as of 01/11/2013  Medication Sig Dispense Refill  . doxycycline (VIBRAMYCIN) 100 MG capsule Take 1 capsule (100 mg total) by mouth 2 (two) times daily.  14 capsule  0  . erythromycin ophthalmic ointment Place into the right eye every 6 (six) hours.  3.5 g  0  . nitroGLYCERIN (NITRO-DUR) 0.2 mg/hr Apply 1/4 patch to affected shoulder every 24 hours.  Do not put in same place each day.  30 patch  12   No facility-administered encounter medications on file as of 01/11/2013.    EXAM:  BP 108/80  Pulse 62  Temp(Src) 97.9 F (36.6 C) (Oral)  Wt 133 lb (60.328 kg)  BMI 22.29 kg/m2  SpO2 98%  Body mass  index is 22.29 kg/(m^2).  GENERAL: vitals reviewed and listed above, alert, oriented, appears well hydrated and in no acute distress  HEENT: atraumatic,   Right upper eye lid sig swelling  2+  And rpink purplish with central lump  on minor lid eversion no foreign body seen  eoms nl no fob seen > also med external lid pustule     tms clear  P : no lesion edema or exudate EOMs are full face is nontender no vesicles or pustules.  NECK: no obvious masses on inspection palpation  No adenopathy .   PSYCH: pleasant and cooperative, no obvious depression or anxiety  ASSESSMENT AND PLAN:  Discussed the following assessment and plan:  Infected eye lid  Internal hordeolum, right Can begin with topical 1 hot compresses and then even oral antibiotics to cover for staph or blepharitis close followup eye doctor if not significantly improved. Expectant management. -Patient advised to return or notify health care team  if symptoms worsen or persist or new concerns arise.  Patient Instructions  This is an ee lid infection with probably  internal stye.  Continue warm compresses  At least 3-4 x per day.  And eye ointment antibiotic  See ophthalmologist  If not a lot better in the next 2 days .     Neta Mends. Panosh M.D.

## 2013-01-14 ENCOUNTER — Telehealth: Payer: Self-pay | Admitting: Internal Medicine

## 2013-01-14 MED ORDER — DOXYCYCLINE HYCLATE 100 MG PO CAPS: 100.0000 mg | ORAL_CAPSULE | Freq: Two times a day (BID) | ORAL | Status: DC

## 2013-01-14 NOTE — Telephone Encounter (Signed)
Pt notified rx was resent to the pharmacy.  Complete medication and if not better should call back.

## 2013-01-14 NOTE — Telephone Encounter (Signed)
Pt states nodules in her eye have increased (2 more). Pt called pharmacy to get the antibiotic, but pharm stated was not there. doxycycline (VIBRAMYCIN) 100 MG capsule.  Pharm: [facility]. Pt said she called earlier today, left message, no one called her back.  Pt transferred to CAN. Pt very concerned that condition is worse.

## 2013-01-14 NOTE — Telephone Encounter (Signed)
Patient Information:  Caller Name: Saga  Phone: [phone]  Patient: Laura Curtis  Gender: Female  DOB: Feb 09, 1946  Age: 67 Years  PCP: Berniece Andreas (Family Practice)  Office Follow Up:  Does the office need to follow up with this patient?: No  Instructions For The Office: N/A  RN Note:  Pt to start Doxycycline called in for her, in conjuction with the opthalmic ointment, and continue with warm compresses so stye's will drain. If pt not much improved by Monday, or condition worsens, she will call back for reassessment or go to UC.  Symptoms  Reason For Call & Symptoms: Pt calling regarding recurrent stye on right eye. 2- now but have drained. Filled Rx for doxycycline for systemic trx. Called to discuss home treatment.  Reviewed Health History In EMR: Yes  Reviewed Medications In EMR: Yes  Reviewed Allergies In EMR: Yes  Reviewed Surgeries / Procedures: Yes  Date of Onset of Symptoms: 12/31/2012  Treatments Tried: warm compress and antibiotic ointment and doxycycline  Treatments Tried Worked: Yes  Guideline(s) Used:  Eye - Pus or Discharge  Disposition Per Guideline:   Go to Office Now  Reason For Disposition Reached:   Eyelid (outer) is very red and painful (or tender to touch)  Advice Given:  Eyelid Cleansing:   Gently wash eyelids and lashes with warm water and wet cotton balls (or cotton gauze). Remove all the dried and liquid pus.  Do this as often as needed.  Call Back If  Eyelid becomes red or swollen  You become worse.  Prescription Option for Antibiotic Eyedrops  Per Protocol - [place]:   If PCP approves calling in prescription, do so per protocol.  Prescription: Polytrim (polymyxin-trimethoprim) eyedrops, 5 ml bottle ($12)  Dosing: 1 to 2 drops four times daily for 5 to 7 days. Note: drop covers the adult eye.  Additional Instructions: Continue using eyedrops until you have awakened two mornings without pus in the eyes.  Be certain to read the  package instructions and warnings.  Patient Will Follow Care Advice:  YES

## 2013-03-08 ENCOUNTER — Ambulatory Visit: Payer: Medicare Other | Admitting: Sports Medicine

## 2013-04-15 ENCOUNTER — Ambulatory Visit (INDEPENDENT_AMBULATORY_CARE_PROVIDER_SITE_OTHER): Payer: Medicare Other | Admitting: Sports Medicine

## 2013-04-15 ENCOUNTER — Encounter: Payer: Self-pay | Admitting: Sports Medicine

## 2013-04-15 VITALS — BP 90/56 | Ht 64.0 in | Wt 130.0 lb

## 2013-04-15 DIAGNOSIS — G8929 Other chronic pain: Secondary | ICD-10-CM

## 2013-04-15 DIAGNOSIS — M7061 Trochanteric bursitis, right hip: Secondary | ICD-10-CM

## 2013-04-15 DIAGNOSIS — M76899 Other specified enthesopathies of unspecified lower limb, excluding foot: Secondary | ICD-10-CM

## 2013-04-15 HISTORY — DX: Other chronic pain: G89.29

## 2013-04-15 NOTE — Progress Notes (Signed)
Chief complaint: Right hip pain  History of present illness: Patient is a 67 year old female who is an avid tennis player coming in with right lateral hip pain. Patient did see another provider who was diagnosed with an IT band and told to go to formal physical therapy. Patient states that this did not feel correct and also never got the order to go to physical therapy. Since that time unfortunately she feels that this pain is getting worse. Patient states that it is tender to palpation on the lateral aspect of the hip and it seems like it is starting to get week. Patient is found it more difficult to play tennis. Patient states that she can have sharp pain especially when seated for a long amount of time and goes to a standing position. Patient states it also hurts more with stairs. Patient denies any groin pain, denies any radiation any numbness in the extremity.  Past medical history, social, surgical and family history all reviewed.   Review of systems done and unremarkable as related to the orthopedic problem.  Physical exam Blood pressure 90/56, height 5\' 4"  (1.626 m), weight 130 lb (58.968 kg). General: No apparent distress alert and oriented x3 mood and affect normal Respiratory: Patient's speak in full sentences and does not appear short of breath Skin: Warm dry intact with no signs of infection or rash Neuro: Cranial nerves II through XII are intact, neurovascularly intact in all extremities with 2+ DTRs and 2+ pulses. Right hip exam: On inspection there is no gross deformity. Range of motion shows the patient has full internal range of motion with no pain at the groin. Patient though does have a positive Pearlean Brownie she and is severely tender to palpation over the greater trochanteric area. Patient is minimally tender as well over the gluteus medius as well as the piriformis muscle on the right side. ITB and going distally he is nontender. Patient has good range of motion of the knee and all  ligaments appear to be intact. She is neurovascularly intact distally with a 2+ DTRs.  After verbal and written consent patient was prepped with 2 alcohol swabs and then injected with a 25-gauge 1-1/2 inch needle 3 cc of 1% lidocaine and 1 cc of 80 mg/dL Depo-Medrol into the greater trochanteric area. Patient did tolerate the procedure well. Patient had significant improvement in pain immediately.

## 2013-04-15 NOTE — Patient Instructions (Signed)
Very nice to meet you I gave you an injection today which I think will help considerably.  I am giving you exercises I would like you to do daily.  We will get you into physical therapy Icing 20 minutes 3 times a day can be helpful as well.  Come back in 4 weeks if not better and we will scan you with the ultrasound.

## 2013-04-15 NOTE — Assessment & Plan Note (Signed)
Patient tolerated procedure well as stated above. Patient given home exercise program and will start formal physical therapy. Patient will follow up again in 4-6 weeks if necessary. Discussed icing as well as over-the-counter anti-inflammatories I could be beneficial. If patient has any worsening weakness she will come back and we will do an ultrasound to rule out gluteus medius tear.

## 2013-04-28 ENCOUNTER — Ambulatory Visit: Payer: Medicare Other | Admitting: Sports Medicine

## 2013-05-02 ENCOUNTER — Ambulatory Visit: Payer: Medicare Other | Attending: Family Medicine

## 2013-05-02 DIAGNOSIS — M25559 Pain in unspecified hip: Secondary | ICD-10-CM | POA: Insufficient documentation

## 2013-05-02 DIAGNOSIS — IMO0001 Reserved for inherently not codable concepts without codable children: Secondary | ICD-10-CM | POA: Insufficient documentation

## 2013-05-27 ENCOUNTER — Ambulatory Visit (INDEPENDENT_AMBULATORY_CARE_PROVIDER_SITE_OTHER): Payer: Medicare Other | Admitting: Certified Nurse Midwife

## 2013-05-27 ENCOUNTER — Encounter: Payer: Self-pay | Admitting: Certified Nurse Midwife

## 2013-05-27 ENCOUNTER — Telehealth: Payer: Self-pay | Admitting: Obstetrics and Gynecology

## 2013-05-27 VITALS — BP 106/62 | HR 68 | Temp 99.4°F | Resp 16 | Ht 64.25 in | Wt 135.0 lb

## 2013-05-27 DIAGNOSIS — N952 Postmenopausal atrophic vaginitis: Secondary | ICD-10-CM

## 2013-05-27 DIAGNOSIS — N390 Urinary tract infection, site not specified: Secondary | ICD-10-CM

## 2013-05-27 LAB — POCT URINALYSIS DIPSTICK
Bilirubin, UA: NEGATIVE
Blood, UA: NEGATIVE
Glucose, UA: NEGATIVE
Ketones, UA: NEGATIVE
Nitrite, UA: NEGATIVE

## 2013-05-27 MED ORDER — CIPROFLOXACIN HCL 500 MG PO TABS: 500.0000 mg | ORAL_TABLET | Freq: Two times a day (BID) | ORAL | Status: DC

## 2013-05-27 MED ORDER — ESTROGENS, CONJUGATED 0.625 MG/GM VA CREA: TOPICAL_CREAM | Freq: Every day | VAGINAL | Status: DC

## 2013-05-27 NOTE — Progress Notes (Signed)
67 y.o.MarriedCaucasian female G4P3 with a 1 day(s) history of the following:incomplete emptying of urine, and pressure with urination. Sexually active: no.  Pt also reports the following associated symptoms: fatigue and? chills Patient has not tried over the counter treatment with.Patient uses pessary for uterine prolapse support and removed the pessary due to the symptoms of pelvic pressure and inability to urinate. Denies vaginal discharge, but admits to not using Premarin cream due to the expense. Denies fever or back pain.No new personal products, but went tubing and set in water for several hours.  O: Health female, WDWN in no apparent distress Affect: normal, orientation x3 Abdomen: positive suprapubic tenderness CVAT: negative bilateral   Exam:  AVW:[id number], Urethra tender, bladder tender, urethral meatus red tender, Skene's normal, atrophic appearance                GNF:AOZHYQMVH: scant and thin, pH 4.5, wet prep done Vagina red, thin, tender, no ulceration noted, atrophic appearance                Cx:  normal appearance and non tender, 2 fb in vagina                Uterus:normal size, non-tender, normal shape and consistency, mid position                Adnexa: normal adnexa and no mass, fullness, tenderness  Wet Prep shows:Negative yeast, BV,Trich Poct urine- neg  A:?UTI symptomatic 2-Uterine Prolapse with pessary use 3-Atrophic vaginitis   P: Reviewed findings suspect UTI due to symptoms Increase water intake and rest. Rx Cipro see order  Lab: Urine culture 2-Avoid pessary use until seen in 2 weeks. Reminded to use Premarin cream with insertion or lubrication 3-Discussed findings and pessary is causing irritation with Premarin use and with atrophy present. Instructed to start Premarin cream again 1/2 gm 2 x weekly , regardless to pessary use. Rx Premarin see order,  Given coupon for use  Rv 2 weeks, prn

## 2013-05-27 NOTE — Telephone Encounter (Signed)
Patient notified of need for appointment @ 1;00pm with D. Leonard,CNM for UTI symptoms.

## 2013-05-27 NOTE — Telephone Encounter (Signed)
Patient has possible UTI, having painful urination, burning, lower discomfort.

## 2013-05-29 LAB — URINE CULTURE: Organism ID, Bacteria: NO GROWTH

## 2013-05-30 NOTE — Progress Notes (Signed)
Note reviewed, agree with plan.  Karena Kinker, MD  

## 2013-06-13 ENCOUNTER — Ambulatory Visit (INDEPENDENT_AMBULATORY_CARE_PROVIDER_SITE_OTHER): Payer: Medicare Other | Admitting: Certified Nurse Midwife

## 2013-06-13 ENCOUNTER — Encounter: Payer: Self-pay | Admitting: Certified Nurse Midwife

## 2013-06-13 VITALS — BP 120/64 | HR 64 | Resp 16 | Ht 64.25 in | Wt 133.0 lb

## 2013-06-13 DIAGNOSIS — N390 Urinary tract infection, site not specified: Secondary | ICD-10-CM

## 2013-06-13 DIAGNOSIS — N952 Postmenopausal atrophic vaginitis: Secondary | ICD-10-CM

## 2013-06-13 NOTE — Progress Notes (Signed)
67 y.o. Married Caucasian female [phone] here for follow up of UTI  treated with Cipro initiated on August 1. 2014.[name] Completed all medication as directed.  Denies any symptoms of urinary frequency, urgency, or pain. "Feels so much better". Also here for recheck of atrophic vaginitis with Premarin use and pessary use (for support). Patient now using Premarin 3 x weekly for next 2 weeks, to allow pessary use with excoriation.  Patient inserts and wears pessary approximately 5 days then removes and cleans. Denies vaginal bleeding or pain.  Urine culture: negative Patient declines urine check today  O: Healthy WD,WN female Affect: normal, orientation x 3 Skin:warm and dry Abdomen:negative suprapubic pain Pelvic exam:EXTERNAL GENITALIA: normal appearing vulva with no masses, tenderness or lesions VAGINA: no abnormal discharge or lesions and moisture noted in vagina, no excoriation or redness or tenderness noted. CERVIX: no lesions or cervical motion tenderness and no ulcerations noted UTERUS: normal size, shape, not tender ADNEXA: no masses palpable and non tender  A:UTI Resolved  2-Atrophic vaginitis responding to consistent Premarin use 3-Pessary use for uterine prolapse  P: Discussed findings of UTI appears resolves, and feel initiated due to vaginal dryness 2-Patient to complete 3x weekly x 2 weeks use of Premarin and return to 2 x weekly and with insert of pessary. Patient will advise if problems. 3-Patient needs to bring pessary in at [facility] to see if any problems with.    RV prn

## 2013-06-13 NOTE — Progress Notes (Deleted)
67 y.o.MarriedCaucasian female [phone] with a {NUMBERS 1-20:19198} {gen duration:315003} history of the following:{symptoms; vaginitis:30830} Sexually active: {yes no:314532} Last sexual activity:{NUMBERS 1-20:19198}days ago. Pt also reports the following associated symptoms: {Sx; associated vaginitis:30832} Patient {HAS HAS NOT:18834}tried over the counter treatment with {Relief:12621} relief.     Exam:  Ext:{EXAM; GYN AVWUJ:81191}                Vag:{Findings; vagina (ob1):14593}                Cx:  {exam; gyn cervix:30847}                Uterus:{exam; uterus:14489}                Adnexa: {exam; adnexa:12223}  Wet Prep shows:{Findings; GYN salin prep:60700}   Dx:{vaginitis type:315262}   Tx:{treatments; vaginitis:14231}

## 2013-06-16 NOTE — Progress Notes (Signed)
Note reviewed, agree with plan.  Carron Mcmurry, MD  

## 2013-07-29 ENCOUNTER — Ambulatory Visit (INDEPENDENT_AMBULATORY_CARE_PROVIDER_SITE_OTHER): Payer: Medicare Other

## 2013-07-29 DIAGNOSIS — Z23 Encounter for immunization: Secondary | ICD-10-CM

## 2013-11-14 ENCOUNTER — Ambulatory Visit (INDEPENDENT_AMBULATORY_CARE_PROVIDER_SITE_OTHER): Payer: Medicare Other | Admitting: Internal Medicine

## 2013-11-14 ENCOUNTER — Encounter: Payer: Self-pay | Admitting: Internal Medicine

## 2013-11-14 VITALS — BP 110/70 | HR 75 | Temp 98.3°F | Ht 64.0 in | Wt 138.0 lb

## 2013-11-14 DIAGNOSIS — Z0000 Encounter for general adult medical examination without abnormal findings: Secondary | ICD-10-CM

## 2013-11-14 DIAGNOSIS — Z1211 Encounter for screening for malignant neoplasm of colon: Secondary | ICD-10-CM

## 2013-11-14 DIAGNOSIS — E2839 Other primary ovarian failure: Secondary | ICD-10-CM

## 2013-11-14 DIAGNOSIS — Z0110 Encounter for examination of ears and hearing without abnormal findings: Secondary | ICD-10-CM

## 2013-11-14 DIAGNOSIS — Z9889 Other specified postprocedural states: Secondary | ICD-10-CM

## 2013-11-14 DIAGNOSIS — Z974 Presence of external hearing-aid: Secondary | ICD-10-CM | POA: Insufficient documentation

## 2013-11-14 NOTE — Progress Notes (Signed)
Chief Complaint  Patient presents with  . Annual Exam    HPI: Patient comes in today for Preventive Medicare wellness visit . No major injuries, ed visits ,hospitalizations , new medications since last visit. Feels healthy prefers not to get most screening tests because is healthy and neg fam hx  Active tennis and gardening  husband poss pmr ok  Family  Mom died 88 ministrokes? Father 39 cva was healthy   Health Maintenance  Topic Date Due  . Colonoscopy  12/04/1993  . Zostavax  12/05/2003  . Influenza Vaccine  05/27/2014  . Tetanus/tdap  11/14/2017  . Pneumococcal Polysaccharide Vaccine Age 12 And Over  Completed   Health Maintenance Revie w    Hearing:  Hearing aids    Vision:  No limitations at present . Last eye check UTD  Safety:  Has smoke detector and wears seat belts.  No firearms. No excess sun exposure. Sees dentist regularly.  Falls: no  Advance directive :  Reviewed  Has one.  Memory: Felt to be good  , no concern from her or her family.  Depression: No anhedonia unusual crying or depressive symptoms  Nutrition: Eats well balanced diet; adequate calcium and vitamin D. No swallowing chewing problems.  Injury: no major injuries in the last six months.  Other healthcare providers:  Reviewed today .  Social:  Lives with spouse married. No pets.  odg sitting for daughter just had baby lives [facility]  Preventive parameters: up-to-date  Reviewed   ADLS:   There are no problems or need for assistance  driving, feeding, obtaining food, dressing, toileting and bathing, managing money using phone. She is independent.  EXERCISE/ HABITS  Tennis and garden Per week   No tobacco    4 serving  Per week.    ROS:  GEN/ HEENT: No fever, significant weight changes sweats headaches vision problems hearing changes, CV/ PULM; No chest pain shortness of breath cough, syncope,edema  change in exercise tolerance. GI /GU: No adominal pain, vomiting, change in bowel habits. No  blood in the stool. No significant GU symptoms. SKIN/HEME: ,no acute skin rashes suspicious lesions or bleeding. No lymphadenopathy, nodules, masses.  NEURO/ PSYCH:  No neurologic signs such as weakness has positional numbness right hand  At night  numbness. No depression anxiety. IMM/ Allergy: No unusual infections.  Allergy .   REST of 12 system review negative except as per HPI   Past Medical History  Diagnosis Date  . Hip pain, right 12/31/2011    A mild amount of arthritis is probable with the limitation of rotation but the key finding today was weakness in abduction   . Shoulder pain, left 12/31/2011    History of rotator cuff tear on the right and now has nontraumatic left shoulder pain   . Left rotator cuff tear 01/14/2012    Supraspinatus tear noted on ultrasound. Provided instructions regarding nitroglycerin patches 1/4 patch daily to shoulder.   To continue circular motion exercises and range of motion. No over the head exercises.  No weight bearing on that side. She can continue to play tennis as tolerated.  FU in 2 weeks if no improvement, 4 weeks if improved.      . Biceps tendinopathy 01/14/2012  . Syncope     June, 2013  . 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  [name] Hx of varicella   . HX: benign breast biopsy   . Hearing aid worn   .  Uterine prolapse   . Hepatitis A   . 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 Stroke Father     age 19  . Other Mother     old age died 19     History   Social History  . Marital Status: Married    Spouse Name: N/A    Number of Children: N/A  . Years of Education: N/A   Social History Main Topics  . Smoking status: Never Smoker   . Smokeless tobacco: None  . Alcohol Use: 1.5 oz/week    3 drink(s) per week  . Drug Use: No  . Sexual Activity: No     Comment: BTL   Other Topics Concern  . None   Social History Narrative   hh of 2 married      retired Production manager.   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 Sees dentist on a regular basis.    etoh 3 x per week                 Outpatient Encounter Prescriptions as of 11/14/2013  Medication Sig  . conjugated estrogens (PREMARIN) vaginal cream Place vaginally daily. 2 x weekly per vagina 1/2 gm    EXAM:  BP 110/70  Pulse 75  Temp(Src) 98.3 F (36.8 C) (Oral)  Ht 5\' 4"  (1.626 m)  Wt 138 lb (62.596 kg)  BMI 23.68 kg/m2  SpO2 96%  LMP 10/28/1995  Body mass index is 23.68 kg/(m^2).  Physical Exam: Vital signs reviewed [id number] is a well-developed well-nourished alert cooperative   who appears stated age in no acute distress.  HEENT: normocephalic atraumatic , Eyes: PERRL EOM's full, conjunctiva clear, Nares: paten,t no deformity discharge or tenderness., Ears: no deformity EAC's clear TMs with normal landmarks. Mouth: clear OP, no lesions, edema.  Moist mucous membranes. Dentition in adequate repair. NECK: supple without masses, thyromegaly or bruits. CHEST/PULM:  Clear to auscultation and percussion breath sounds equal no wheeze , rales or rhonchi. No chest wall deformities or tenderness. CV: PMI is nondisplaced, S1 S2 no gallops, murmurs, rubs. Peripheral pulses are full without delay.No JVD .  ABDOMEN: Bowel sounds normal nontender  No guard or rebound, no hepato splenomegal no CVA tenderness.   Extremtities:  No clubbing cyanosis or edema, no acute joint swelling or redness no focal atrophy NEURO:  Oriented x3, cranial nerves 3-12 appear to be intact, no obvious focal weakness,gait within normal limits no abnormal reflexes or asymmetrical SKIN: No acute rashes normal turgor, color, no bruising or petechiae. Age sun changes  PSYCH: Oriented, good eye contact, no obvious depression anxiety, cognition and judgment appear normal. LN: no cervical axillary  adenopathy No noted  deficits in memory, attention, and speech.   Lab Results  Component Value Date   WBC 6.4 04/15/2012   HGB 14.1 04/15/2012   HCT 42.8 04/15/2012   GLUCOSE 98 04/15/2012   CHOL 244* 07/27/2012   TRIG 75.0 07/27/2012   HDL 82.90 07/27/2012   LDLDIRECT 138.7 07/27/2012   ALT 11 04/15/2012   AST 14 04/15/2012   NA 140 04/15/2012   K 4.0 04/15/2012   CL 102 04/15/2012   CREATININE 0.65 04/15/2012   BUN 17 04/15/2012   CO2 29 04/15/2012  TSH 3.16 07/27/2012    ASSESSMENT AND PLAN:  Discussed the following assessment and plan:  Visit for preventive health examination - reviewed risk benefit of screenign tests declines mammo, colososcopy agree with no blood tests at this time . will do ifobt and dexa   Routine general medical examination at a health care facility  Special screening for malignant neoplasms, colon - Plan: Fecal occult blood, imunochemical  Estrogen deficiency - neg fam hx osteoprosis noted or hip fracture  no recnet dexa  - Plan: DG Bone Density  Hearing aid worn  Patient Care Team: Burnis Medin, MD as PCP - General (Internal Medicine) Peri Maris, MD as Attending Physician (Obstetrics and Gynecology) Amy Y Martinique, MD as Consulting Physician (Dermatology)  Patient Instructions  Continue lifestyle intervention healthy eating and exercise . Consider mammogram before age 43 in healthy persons every 2 years. prevnar 13  ( infant pneumococcal vaccine)  Stool card yearly at least for colon cancer screen if not doing colonoscopy.  Yearly visit  For reasons discussed .    Bone Health Our bones do many things. They provide structure, protect organs, anchor muscles, and store calcium. Adequate calcium in your diet and weight-bearing physical activity help build strong bones, improve bone amounts, and may reduce the risk of weakening of bones (osteoporosis) later in life. PEAK BONE MASS By age 82, the average woman has acquired most of her skeletal bone mass. A large decline  occurs in older adults which increases the risk of osteoporosis. In women this occurs around the time of menopause. It is important for young girls to reach their peak bone mass in order to maintain bone health throughout life. A person with high bone mass as a young adult will be more likely to have a higher bone mass later in life. Not enough calcium consumption and physical activity early on could result in a failure to achieve optimum bone mass in adulthood. OSTEOPOROSIS Osteoporosis is a disease of the bones. It is defined as low bone mass with deterioration of bone structure. Osteoporosis leads to an increase risk of fractures with falls. These fractures commonly happen in the wrist, hip, and spine. While men and women of all ages and background can develop osteoporosis, some of the risk factors for osteoporosis are:  Female.  White.  Postmenopausal.  Older adults.  Small in body size.  Eating a diet low in calcium.  Physically inactive.  Smoking.  Use of some medications.  Family history. CALCIUM Calcium is a mineral needed by the body for healthy bones, teeth, and proper function of the heart, muscles, and nerves. The body cannot produce calcium so it must be absorbed through food. Good sources of calcium include:  Dairy products (low fat or nonfat milk, cheese, and yogurt).  Dark green leafy vegetables (bok choy and broccoli).  Calcium fortified foods (orange juice, cereal, bread, soy beverages, and tofu products).  Nuts (almonds). Recommended amounts of calcium vary for individuals. RECOMMENDED CALCIUM INTAKES Age and Amount in mg per day  Children 1 to 3 years / 700 mg  Children 4 to 8 years / 1,000 mg  Children 9 to 13 years / 1,300 mg  Teens 14 to 18 years / 1,300 mg  Adults 19 to 50 years / 1,000 mg  Adult women 51 to 70 years / 1,200 mg  Adults 71 years and older / 1,200 mg  Pregnant and breastfeeding teens / 1,300 mg  Pregnant and breastfeeding  adults / 1,000 mg  Vitamin D also plays an important role in healthy bone development. Vitamin D helps in the absorption of calcium. WEIGHT-BEARING PHYSICAL ACTIVITY Regular physical activity has many positive health benefits. Benefits include strong bones. Weight-bearing physical activity early in life is important in reaching peak bone mass. Weight-bearing physical activities cause muscles and bones to work against gravity. Some examples of weight bearing physical activities include:  Walking, jogging, or running.  [employer].  Jumping rope.  Dancing.  Soccer.  Tennis or Racquetball.  Stair climbing.  Basketball.  Hiking.  Weight lifting.  Aerobic fitness classes. Including weight-bearing physical activity into an exercise plan is a great way to keep bones healthy. Adults: Engage in at least 30 minutes of moderate physical activity on most, preferably all, days of the week. Children: Engage in at least 60 minutes of moderate physical activity on most, preferably all, days of the week. FOR MORE INFORMATION [place] Web designer, Soil scientist for [employer] and Promotion: www.cnpp.usda.[place]: EquipmentWeekly.com.ee Document Released: 01/03/2004 Document Revised: 02/07/2013 Document Reviewed: 04/04/2009 [facility] Patient Information 2014 [place], [place].     Standley Brooking. Panosh M.D.

## 2013-11-14 NOTE — Patient Instructions (Signed)
Continue lifestyle intervention healthy eating and exercise . Consider mammogram before age 70 in healthy persons every 2 years. prevnar 13  ( infant pneumococcal vaccine)  Stool card yearly at least for colon cancer screen if not doing colonoscopy.  Yearly visit  For reasons discussed .    Bone Health Our bones do many things. They provide structure, protect organs, anchor muscles, and store calcium. Adequate calcium in your diet and weight-bearing physical activity help build strong bones, improve bone amounts, and may reduce the risk of weakening of bones (osteoporosis) later in life. PEAK BONE MASS By age 24, the average woman has acquired most of her skeletal bone mass. A large decline occurs in older adults which increases the risk of osteoporosis. In women this occurs around the time of menopause. It is important for young girls to reach their peak bone mass in order to maintain bone health throughout life. A person with high bone mass as a young adult will be more likely to have a higher bone mass later in life. Not enough calcium consumption and physical activity early on could result in a failure to achieve optimum bone mass in adulthood. OSTEOPOROSIS Osteoporosis is a disease of the bones. It is defined as low bone mass with deterioration of bone structure. Osteoporosis leads to an increase risk of fractures with falls. These fractures commonly happen in the wrist, hip, and spine. While men and women of all ages and background can develop osteoporosis, some of the risk factors for osteoporosis are:  Female.  White.  Postmenopausal.  Older adults.  Small in body size.  Eating a diet low in calcium.  Physically inactive.  Smoking.  Use of some medications.  Family history. CALCIUM Calcium is a mineral needed by the body for healthy bones, teeth, and proper function of the heart, muscles, and nerves. The body cannot produce calcium so it must be absorbed through food.  Good sources of calcium include:  Dairy products (low fat or nonfat milk, cheese, and yogurt).  Dark green leafy vegetables (bok choy and broccoli).  Calcium fortified foods (orange juice, cereal, bread, soy beverages, and tofu products).  Nuts (almonds). Recommended amounts of calcium vary for individuals. RECOMMENDED CALCIUM INTAKES Age and Amount in mg per day  Children 1 to 3 years / 700 mg  Children 4 to 8 years / 1,000 mg  Children 9 to 13 years / 1,300 mg  Teens 14 to 18 years / 1,300 mg  Adults 19 to 50 years / 1,000 mg  Adult women 51 to 70 years / 1,200 mg  Adults 71 years and older / 1,200 mg  Pregnant and breastfeeding teens / 1,300 mg  Pregnant and breastfeeding adults / 1,000 mg Vitamin D also plays an important role in healthy bone development. Vitamin D helps in the absorption of calcium. WEIGHT-BEARING PHYSICAL ACTIVITY Regular physical activity has many positive health benefits. Benefits include strong bones. Weight-bearing physical activity early in life is important in reaching peak bone mass. Weight-bearing physical activities cause muscles and bones to work against gravity. Some examples of weight bearing physical activities include:  Walking, jogging, or running.  [employer].  Jumping rope.  Dancing.  Soccer.  Tennis or Racquetball.  Stair climbing.  Basketball.  Hiking.  Weight lifting.  Aerobic fitness classes. Including weight-bearing physical activity into an exercise plan is a great way to keep bones healthy. Adults: Engage in at least 30 minutes of moderate physical activity on most, preferably all, days of the  week. Children: Engage in at least 60 minutes of moderate physical activity on most, preferably all, days of the week. FOR MORE INFORMATION [place] Web designer, Soil scientist for [employer] and Promotion: www.cnpp.usda.[place]: EquipmentWeekly.com.ee Document Released: 01/03/2004  Document Revised: 02/07/2013 Document Reviewed: 04/04/2009 [facility] Patient Information 2014 [place], [place].

## 2013-11-14 NOTE — Progress Notes (Signed)
Pre visit review using our clinic review tool, if applicable. No additional management support is needed unless otherwise documented below in the visit note. 

## 2013-11-18 ENCOUNTER — Encounter: Payer: Self-pay | Admitting: Obstetrics & Gynecology

## 2013-11-21 ENCOUNTER — Encounter: Payer: Self-pay | Admitting: Obstetrics & Gynecology

## 2013-11-21 ENCOUNTER — Ambulatory Visit (INDEPENDENT_AMBULATORY_CARE_PROVIDER_SITE_OTHER): Payer: Medicare Other | Admitting: Obstetrics & Gynecology

## 2013-11-21 VITALS — BP 98/62 | HR 58 | Resp 12 | Ht 64.5 in | Wt 137.6 lb

## 2013-11-21 DIAGNOSIS — Z124 Encounter for screening for malignant neoplasm of cervix: Secondary | ICD-10-CM

## 2013-11-21 DIAGNOSIS — Z01419 Encounter for gynecological examination (general) (routine) without abnormal findings: Secondary | ICD-10-CM

## 2013-11-21 NOTE — Progress Notes (Signed)
68 y.o. [id number] MarriedCaucasianF here for annual exam.  No vaginal bleeding.  Declines MMG.  Had Mohs surgery 2012 on her nose.  Sees Dr. Martinique yearly for skin surgery.  Patient's last menstrual period was 10/28/1995.          Sexually active: no  The current method of family planning is tubal ligation.    Exercising: yes  tennis, gardening, walking, and gym Smoker:  no  Health Maintenance: Pap:  04/03/10 WNL History of abnormal Pap:  no MMG:  ? 1995 Colonoscopy:  None-declines BMD:   ? 1995-scheduled for 11/25/13 TDaP:  1/09 Screening Labs: PCP, Hb today: PCP, Urine today: PCP   reports that she has never smoked. She has never used smokeless tobacco. She reports that she drinks about 1.0 ounces of alcohol per week. She reports that she does not use illicit drugs.  Past Medical History  Diagnosis Date  . Hip pain, right 12/31/2011    A mild amount of arthritis is probable with the limitation of rotation but the key finding today was weakness in abduction   . Shoulder pain, left 12/31/2011    History of rotator cuff tear on the right and now has nontraumatic left shoulder pain   . Left rotator cuff tear 01/14/2012    Supraspinatus tear noted on ultrasound. Provided instructions regarding nitroglycerin patches 1/4 patch daily to shoulder.   To continue circular motion exercises and range of motion. No over the head exercises.  No weight bearing on that side. She can continue to play tennis as tolerated.  FU in 2 weeks if no improvement, 4 weeks if improved.      . Biceps tendinopathy 01/14/2012  . Syncope     June, 2013  . 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  [name] Hx of varicella   . HX: benign breast biopsy   . Hearing aid worn   . Uterine prolapse   . Hepatitis A   . 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 Appendectomy  1968  . Rotator cuff repair  2001  . Breast biopsy  1998  . Tonsillectomy and adenoidectomy    .  Ovarian cyst surgery  1968  . Tubal ligation    . Mohs surgery      for squamous cell ca left nose    Current Outpatient Prescriptions  Medication Sig Dispense Refill  . conjugated estrogens (PREMARIN) vaginal cream Place vaginally daily. 2 x weekly per vagina 1/2 gm  30 g  6  . clobetasol ointment (TEMOVATE) 4.85 % Apply 1 application topically.       No current facility-administered medications for this visit.    Family History  Problem Relation Age of Onset  . Stroke Father     age 60  . Other Mother     old age died 1   . Deep vein thrombosis Mother     ROS:  Pertinent items are noted in HPI.  Otherwise, a comprehensive ROS was negative.  Exam:   BP 98/62  Pulse 58  Resp 12  Ht 5' 4.5" (1.638 m)  Wt 137 lb 9.6 oz (62.415 kg)  BMI 23.26 kg/m2  LMP 10/28/1995  Weight change:   Height: 5' 4.5" (163.8 cm)  Ht Readings from Last 3 Encounters:  11/21/13 5' 4.5" (1.638 m)  11/14/13 5\' 4"  (1.626 m)  06/13/13 5' 4.25" (1.632 m)  General appearance: alert, cooperative and appears stated age Head: Normocephalic, without obvious abnormality, atraumatic Neck: no adenopathy, supple, symmetrical, trachea midline and thyroid normal to inspection and palpation Lungs: clear to auscultation bilaterally Breasts: normal appearance, no masses or tenderness Heart: regular rate and rhythm Abdomen: soft, non-tender; bowel sounds normal; no masses,  no organomegaly Extremities: extremities normal, atraumatic, no cyanosis or edema Skin: Skin color, texture, turgor normal. No rashes or lesions Lymph nodes: Cervical, supraclavicular, and axillary nodes normal. No abnormal inguinal nodes palpated Neurologic: Grossly normal   Pelvic: External genitalia:  no lesions              Urethra:  normal appearing urethra with no masses, tenderness or lesions              Bartholins and Skenes: normal                 Vagina: normal appearing vagina with normal color and discharge, no lesions,  3rd degree rectocele with pessary out              Cervix: no lesions              Pap taken: yes Bimanual Exam:  Uterus:  normal size, contour, position, consistency, mobility, non-tender              Adnexa: no mass, fullness, tenderness               Rectovaginal: Confirms               Anus:  normal sphincter tone, no lesions  A:  Well Woman with normal exam PMP Uterine prolapse with rectocele, uses #3 incontinence ring with support LS&A, appears very mild--pt not using steroid ointment and declines refill  P:   Pap obtained today. Declines MMG, Colonoscopy (does stool cards per pt with Dr. Regis Bill although no results in EPIC seen), and BMD. D/W pt increased breast cancer risk with premarin.  I declined refill unless she gets a MMG.  She declines a MMG. return 2 years or prn  An After Visit Summary was printed and given to the patient.

## 2013-11-21 NOTE — Patient Instructions (Signed)

## 2013-11-22 LAB — IPS PAP SMEAR ONLY

## 2013-11-24 ENCOUNTER — Other Ambulatory Visit: Payer: Medicare Other

## 2013-11-25 ENCOUNTER — Ambulatory Visit (INDEPENDENT_AMBULATORY_CARE_PROVIDER_SITE_OTHER)
Admission: RE | Admit: 2013-11-25 | Discharge: 2013-11-25 | Disposition: A | Discharge status: Home / Self Care | Payer: Medicare Other | Source: Ambulatory Visit | Attending: Internal Medicine | Admitting: Internal Medicine

## 2013-11-25 ENCOUNTER — Telehealth: Payer: Self-pay

## 2013-11-25 DIAGNOSIS — E2839 Other primary ovarian failure: Secondary | ICD-10-CM

## 2013-11-25 NOTE — Telephone Encounter (Signed)
Patient notified of results. See result note.  

## 2013-11-25 NOTE — Telephone Encounter (Signed)
Message copied by Robley Fries on Fri Nov 25, 2013 11:32 AM ------      Message from: Megan Salon      Created: Wed Nov 23, 2013  3:53 PM       Inform pap normal but yeast present.  If symptomatic can treat with Diflucan 150mg  po x 1 and repeat 48 hrs. ------

## 2013-11-25 NOTE — Telephone Encounter (Signed)
Lmtcb//kn 

## 2014-07-24 ENCOUNTER — Ambulatory Visit (INDEPENDENT_AMBULATORY_CARE_PROVIDER_SITE_OTHER): Payer: Medicare Other | Admitting: Family Medicine

## 2014-07-24 DIAGNOSIS — Z23 Encounter for immunization: Secondary | ICD-10-CM

## 2014-08-01 ENCOUNTER — Encounter: Payer: Self-pay | Admitting: Internal Medicine

## 2014-08-01 ENCOUNTER — Ambulatory Visit (INDEPENDENT_AMBULATORY_CARE_PROVIDER_SITE_OTHER): Payer: Medicare Other | Admitting: Internal Medicine

## 2014-08-01 VITALS — BP 112/74 | Temp 97.7°F | Wt 135.0 lb

## 2014-08-01 DIAGNOSIS — L255 Unspecified contact dermatitis due to plants, except food: Secondary | ICD-10-CM

## 2014-08-01 MED ORDER — PREDNISONE 10 MG PO TABS: ORAL_TABLET | ORAL | Status: DC

## 2014-08-01 MED ORDER — FLUOCINONIDE-E 0.05 % EX CREA: 1.0000 "application " | TOPICAL_CREAM | Freq: Two times a day (BID) | CUTANEOUS | Status: DC

## 2014-08-01 NOTE — Patient Instructions (Signed)
I agree  This is contact dermatitis from plant.    Poison [employer] ivy is a inflammation of the skin (contact dermatitis) caused by touching the allergens on the leaves of the ivy plant following previous exposure to the plant. The rash usually appears 48 hours after exposure. The rash is usually bumps (papules) or blisters (vesicles) in a linear pattern. Depending on your own sensitivity, the rash may simply cause redness and itching, or it may also progress to blisters which may break open. These must be well cared for to prevent secondary bacterial (germ) infection, followed by scarring. Keep any open areas dry, clean, dressed, and covered with an antibacterial ointment if needed. The eyes may also get puffy. The puffiness is worst in the morning and gets better as the day progresses. This dermatitis usually heals without scarring, within 2 to 3 weeks without treatment. HOME CARE INSTRUCTIONS  Thoroughly wash with soap and water as soon as you have been exposed to poison ivy. You have about one half hour to remove the plant resin before it will cause the rash. This washing will destroy the oil or antigen on the skin that is causing, or will cause, the rash. Be sure to wash under your fingernails as any plant resin there will continue to spread the rash. Do not rub skin vigorously when washing affected area. Poison ivy cannot spread if no oil from the plant remains on your body. A rash that has progressed to weeping sores will not spread the rash unless you have not washed thoroughly. It is also important to wash any clothes you have been wearing as these may carry active allergens. The rash will return if you wear the unwashed clothing, even several days later. Avoidance of the plant in the future is the best measure. Poison ivy plant can be recognized by the number of leaves. Generally, poison ivy has three leaves with flowering branches on a single stem. Diphenhydramine may be purchased over the  counter and used as needed for itching. Do not drive with this medication if it makes you drowsy.Ask your caregiver about medication for children. SEEK MEDICAL CARE IF:  Open sores develop.  Redness spreads beyond area of rash.  You notice purulent (pus-like) discharge.  You have increased pain.  Other signs of infection develop (such as fever). Document Released: 10/10/2000 Document Revised: 01/05/2012 Document Reviewed: 03/23/2009 [facility] Patient Information 2015 [place], [place]. This information is not intended to replace advice given to you by your health care provider. Make sure you discuss any questions you have with your health care provider.

## 2014-08-01 NOTE — Progress Notes (Signed)
Pre visit review using our clinic review tool, if applicable. No additional management support is needed unless otherwise documented below in the visit note.   Chief Complaint  Patient presents with  . Poison Ivy    HPI: Patient Laura Curtis  comes in today for SDA for  new problem evaluation. Onset a week ago after digging up roots  Began left shin area and no all over legs a little on leftWsrist and tiny area under nose. Used saline  Wash no steroid  Still very eichy and extensive . No fever systemic sx  Remote hx of PI type rash . ROS: See pertinent positives and negatives per HPI.  Past Medical History  Diagnosis Date  . Hip pain, right 12/31/2011    A mild amount of arthritis is probable with the limitation of rotation but the key finding today was weakness in abduction   . Shoulder pain, left 12/31/2011    History of rotator cuff tear on the right and now has nontraumatic left shoulder pain   . Left rotator cuff tear 01/14/2012    Supraspinatus tear noted on ultrasound. Provided instructions regarding nitroglycerin patches 1/4 patch daily to shoulder.   To continue circular motion exercises and range of motion. No over the head exercises.  No weight bearing on that side. She can continue to play tennis as tolerated.  FU in 2 weeks if no improvement, 4 weeks if improved.      . Biceps tendinopathy 01/14/2012  . Syncope     June, 2013  . 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  [name] Hx of varicella   . HX: benign breast biopsy   . Hearing aid worn   . Uterine prolapse   . Hepatitis A   . 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 Stroke Father     age 107  . Other Mother     old age died 65   . Deep vein thrombosis Mother     History   Social History  . Marital Status: Married    Spouse Name: N/A    Number of Children: N/A  . Years of Education: N/A   Social History Main Topics  . Smoking status: Never Smoker   .  Smokeless tobacco: Never Used  . Alcohol Use: 1.0 - 1.5 oz/week    2-3 drink(s) per week     Comment: occ  . Drug Use: No  . Sexual Activity: No     Comment: BTL   Other Topics Concern  . 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 Sees dentist on a regular basis.    etoh 3 x per week                 Outpatient Encounter Prescriptions as of 08/01/2014  Medication Sig  . conjugated estrogens (PREMARIN) vaginal cream Place vaginally daily. 2 x weekly per vagina 1/2 gm  . fluocinonide-emollient (LIDEX-E) 0.05 % cream Apply 1 application topically 2 (two) times daily. For poison ivy not on face  . predniSONE (DELTASONE) 10 MG tablet Take po  pills  per day,6,6,6,4,4,4,2,2,2,1,1,1  . [DISCONTINUED] clobetasol ointment (TEMOVATE) 1.47 % Apply 1 application topically.    EXAM:  BP 112/74  Temp(Src) 97.7 F (36.5 C) (Oral)  Wt 135 lb (61.236 kg)  LMP 10/28/1995  Body mass index is 22.82 kg/(m^2).  GENERAL: vitals reviewed and listed above, alert, oriented, appears well hydrated and in no acute distress HEENT: atraumatic, conjunctiva  clear, no obvious abnormalities on inspection of external nose and ears Skin large area confluent and patches red some in lines  Left ant chin and both thighs  Left wrist  No edema  PSYCH: pleasant and cooperative, no obvious depression or anxiety  ASSESSMENT AND PLAN:  Discussed the following assessment and plan:  Plant dermatitis - extensive legs and more steroid rx topical if needed  counseled about avoidance prevention -Patient advised to return or notify health care team  if symptoms worsen ,persist or new concerns arise.  Patient Instructions  I agree  This is contact dermatitis from plant.    Poison [employer] ivy is a inflammation of the skin  (contact dermatitis) caused by touching the allergens on the leaves of the ivy plant following previous exposure to the plant. The rash usually appears 48 hours after exposure. The rash is usually bumps (papules) or blisters (vesicles) in a linear pattern. Depending on your own sensitivity, the rash may simply cause redness and itching, or it may also progress to blisters which may break open. These must be well cared for to prevent secondary bacterial (germ) infection, followed by scarring. Keep any open areas dry, clean, dressed, and covered with an antibacterial ointment if needed. The eyes may also get puffy. The puffiness is worst in the morning and gets better as the day progresses. This dermatitis usually heals without scarring, within 2 to 3 weeks without treatment. HOME CARE INSTRUCTIONS  Thoroughly wash with soap and water as soon as you have been exposed to poison ivy. You have about one half hour to remove the plant resin before it will cause the rash. This washing will destroy the oil or antigen on the skin that is causing, or will cause, the rash. Be sure to wash under your fingernails as any plant resin there will continue to spread the rash. Do not rub skin vigorously when washing affected area. Poison ivy cannot spread if no oil from the plant remains on your body. A rash that has progressed to weeping sores will not spread the rash unless you have not washed thoroughly. It is also important to wash any clothes you have been wearing as these may carry active allergens. The rash will return if you wear the unwashed clothing, even several days later. Avoidance of the plant in the future is the best measure. Poison ivy plant can be recognized by the number of leaves. Generally, poison ivy has three leaves with flowering branches on a single stem. Diphenhydramine may be purchased over the counter and used as needed for itching. Do not drive with this medication if it makes you drowsy.Ask your  caregiver about medication for children. SEEK MEDICAL CARE IF:  Open sores develop.  Redness spreads beyond area of rash.  You notice purulent (pus-like) discharge.  You have increased pain.  Other signs of infection develop (such as fever). Document Released: 10/10/2000 Document Revised: 01/05/2012 Document Reviewed: 03/23/2009 [facility] Patient Information 2015 [place], [place]. This information is not intended to replace advice given to you by your health care provider. Make sure you discuss any questions you have with  your health care provider.      Standley Brooking. Panosh M.D.

## 2014-08-17 ENCOUNTER — Encounter: Payer: Medicare Other | Admitting: Internal Medicine

## 2014-08-17 ENCOUNTER — Encounter: Payer: Self-pay | Admitting: Internal Medicine

## 2014-08-17 NOTE — Progress Notes (Signed)
Document opened and reviewed for OV but appt  canceled same day .  

## 2014-08-28 ENCOUNTER — Encounter: Payer: Self-pay | Admitting: Internal Medicine

## 2014-11-17 ENCOUNTER — Encounter: Payer: Medicare Other | Admitting: Internal Medicine

## 2015-01-29 ENCOUNTER — Encounter: Payer: Self-pay | Admitting: Internal Medicine

## 2015-01-29 ENCOUNTER — Ambulatory Visit (INDEPENDENT_AMBULATORY_CARE_PROVIDER_SITE_OTHER): Payer: Medicare Other | Admitting: Internal Medicine

## 2015-01-29 VITALS — BP 110/70 | HR 70 | Temp 98.0°F | Ht 64.0 in | Wt 138.3 lb

## 2015-01-29 DIAGNOSIS — J069 Acute upper respiratory infection, unspecified: Secondary | ICD-10-CM | POA: Diagnosis not present

## 2015-01-29 DIAGNOSIS — B9789 Other viral agents as the cause of diseases classified elsewhere: Principal | ICD-10-CM

## 2015-01-29 NOTE — Patient Instructions (Signed)
This is most likely viral respiratory infection  . Should runs its coures., no evidence of pneumonia on exam today . Treatment is supportive Rest fluids . contact us if fever relapsing sx . Not a lot better in 2 weeks although cough can last 3-4 weeks  Cough med may or may not help feel better   otc ok  Not curative .

## 2015-01-29 NOTE — Progress Notes (Signed)
Pre visit review using our clinic review tool, if applicable. No additional management support is needed unless otherwise documented below in the visit note.  Chief Complaint  Patient presents with  . Cough    Started last Thursday.  [name] Headache  . Nasal Congestion    HPI: Patient Laura Curtis  comes in today for SDA for  new problem evaluation. Onset 3+ days of cough ur mild congestion . Somewhat achy No fever  Under the weather  stiocking  phelgm  And nagging headaches.  No has f allergy    Daughter had pneumonia [place]   Grandchild 93 days old is well .    No cp sob hemoptysis hx of lung disease or wheezing ROS: See pertinent positives and negatives per HPI.  Past Medical History  Diagnosis Date  . Hip pain, right 12/31/2011    A mild amount of arthritis is probable with the limitation of rotation but the key finding today was weakness in abduction   . Shoulder pain, left 12/31/2011    History of rotator cuff tear on the right and now has nontraumatic left shoulder pain   . Left rotator cuff tear 01/14/2012    Supraspinatus tear noted on ultrasound. Provided instructions regarding nitroglycerin patches 1/4 patch daily to shoulder.   To continue circular motion exercises and range of motion. No over the head exercises.  No weight bearing on that side. She can continue to play tennis as tolerated.  FU in 2 weeks if no improvement, 4 weeks if improved.      . Biceps tendinopathy 01/14/2012  . Syncope     June, 2013  . 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  [name] Hx of varicella   . HX: benign breast biopsy   . Hearing aid worn   . Uterine prolapse   . Hepatitis A   . 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 Stroke Father     age 63  . Other Mother     old age died 87   . Deep vein thrombosis Mother     History   Social History  . Marital Status: Married    Spouse Name: N/A  . Number of Children: N/A  . Years  of Education: N/A   Social History Main Topics  . Smoking status: Never Smoker   . Smokeless tobacco: Never Used  . Alcohol Use: 1.0 - 1.5 oz/week    2-3 drink(s) per week     Comment: occ  . Drug Use: No  . Sexual Activity: No     Comment: BTL   Other Topics Concern  . 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 Sees dentist on a regular basis.    etoh 3 x per week                 Outpatient Encounter Prescriptions as of 01/29/2015  Medication Sig  . [DISCONTINUED] conjugated estrogens (PREMARIN) vaginal cream Place vaginally daily. 2 x weekly per vagina 1/2 gm  . [DISCONTINUED] fluocinonide-emollient (LIDEX-E) 0.05 % cream Apply 1 application topically 2 (  two) times daily. For poison ivy not on face  . [DISCONTINUED] predniSONE (DELTASONE) 10 MG tablet Take po  pills per day,6,6,6,4,4,4,2,2,2,1,1,1    EXAM:  BP 110/70 mmHg  Pulse 70  Temp(Src) 98 F (36.7 C) (Oral)  Ht 5\' 4"  (1.626 m)  Wt 138 lb 4.8 oz (62.732 kg)  BMI 23.73 kg/m2  SpO2 98%  LMP 10/28/1995  Body mass index is 23.73 kg/(m^2).  GENERAL: vitals reviewed and listed above, alert, oriented, appears well hydrated and in no acute distress non toxic  HEENT: atraumatic, conjunctiva  clear, no obvious abnormalities on inspection of external nose and ears OP : no lesion edema or exudate  Mild congestion no sinus tendernses NECK: no obvious masses on inspection palpation  LUNGS: clear to auscultation bilaterally, no wheezes, rales or rhonchi, good air movement  CV: HRRR, no clubbing cyanosis or  peripheral edema nl cap refill  MS: moves all extremities without noticeable focal  abnormality PSYCH: pleasant and cooperative,   ASSESSMENT AND PLAN:  Discussed the following assessment and plan:  Viral upper respiratory tract  infection with cough - expectant managment  fu with alarm sx  etc  Counseled. About fu parameters -Patient advised to return or notify health care team  if symptoms worsen ,persist or new concerns arise.  Patient Instructions  This is most likely viral respiratory infection  . Should runs its coures., no evidence of pneumonia on exam today . Treatment is supportive Rest fluids . contact us if fever relapsing sx . Not a lot better in 2 weeks although cough can last 3-4 weeks  Cough med may or may not help feel better   otc ok  Not curative .        Standley Brooking. Panosh M.D.

## 2015-02-07 ENCOUNTER — Telehealth: Payer: Self-pay | Admitting: Internal Medicine

## 2015-02-07 NOTE — Telephone Encounter (Signed)
FYI

## 2015-02-07 NOTE — Telephone Encounter (Signed)
Patient Name: Laura Curtis  DOB: 01-22-1946    Initial Comment Caller states she saw MD Monday 4/4 for 2 wks of congestion. Still has it, feels like up in higher sinuses, slow improvement.   Nurse Assessment  Nurse: Leilani Merl, RN, Heather Date/Time (Eastern Time): 02/07/2015 8:40:35 AM  Confirm and document reason for call. If symptomatic, describe symptoms. ---Caller states she saw MD Monday 4/4 for 2 wks of congestion. Still has it, it still feels like up in higher sinuses, but now she is coughing and she is coughing up green mucus, no fever. The coughing started a week ago  Has the patient traveled out of the country within the last 30 days? ---Not Applicable  Does the patient require triage? ---Yes  Related visit to physician within the last 2 weeks? ---No  Does the PT have any chronic conditions? (i.e. diabetes, asthma, etc.) ---No     Guidelines    Guideline Title Affirmed Question Affirmed Notes  Cough - Acute Productive SEVERE coughing spells (e.g., whooping sound after coughing, vomiting after coughing)    Final Disposition User   See Physician within 24 Hours Standifer, RN, Water quality scientist    Comments  appt made with Dr. Regis Bill tomorrow at 9:15 am.

## 2015-02-08 ENCOUNTER — Ambulatory Visit (INDEPENDENT_AMBULATORY_CARE_PROVIDER_SITE_OTHER): Payer: Medicare Other | Admitting: Internal Medicine

## 2015-02-08 ENCOUNTER — Encounter: Payer: Self-pay | Admitting: Internal Medicine

## 2015-02-08 VITALS — BP 118/68 | Temp 97.9°F | Ht 64.0 in | Wt 139.6 lb

## 2015-02-08 DIAGNOSIS — J069 Acute upper respiratory infection, unspecified: Secondary | ICD-10-CM

## 2015-02-08 DIAGNOSIS — J019 Acute sinusitis, unspecified: Secondary | ICD-10-CM

## 2015-02-08 MED ORDER — DOXYCYCLINE HYCLATE 100 MG PO TABS: 100.0000 mg | ORAL_TABLET | Freq: Two times a day (BID) | ORAL | Status: DC

## 2015-02-08 NOTE — Progress Notes (Signed)
Pre visit review using our clinic review tool, if applicable. No additional management support is needed unless otherwise documented below in the visit note.  Chief Complaint  Patient presents with  . Follow Up URI/Cough    Not getting better.  Now has sore throat and crusty eyes in the morning.    HPI: Patient Laura Curtis  comes in today for SDA for  new problem evaluation. 14 days of resp illness now  husband also go t sick  Continue sticky drainage and cough and  Now eye dc no itching or fever . Has frontal pressure ha  No sob but coughing fits are bad  No hemoptysis . Using sinus irrigation  With min success.  ROS: See pertinent positives and negatives per HPI. No fever chills cp   Past Medical History  Diagnosis Date  . Hip pain, right 12/31/2011    A mild amount of arthritis is probable with the limitation of rotation but the key finding today was weakness in abduction   . Shoulder pain, left 12/31/2011    History of rotator cuff tear on the right and now has nontraumatic left shoulder pain   . Left rotator cuff tear 01/14/2012    Supraspinatus tear noted on ultrasound. Provided instructions regarding nitroglycerin patches 1/4 patch daily to shoulder.   To continue circular motion exercises and range of motion. No over the head exercises.  No weight bearing on that side. She can continue to play tennis as tolerated.  FU in 2 weeks if no improvement, 4 weeks if improved.      . Biceps tendinopathy 01/14/2012  . Syncope     June, 2013  . 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  [name] Hx of varicella   . HX: benign breast biopsy   . Hearing aid worn   . Uterine prolapse   . Hepatitis A   . 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 Stroke Father     age 40  . Other Mother     old age died 66   . Deep vein thrombosis Mother     History   Social History  . Marital Status: Married    Spouse Name: N/A  . Number of  Children: N/A  . Years of Education: N/A   Social History Main Topics  . Smoking status: Never Smoker   . Smokeless tobacco: Never Used  . Alcohol Use: 1.0 - 1.5 oz/week    2-3 drink(s) per week     Comment: occ  . Drug Use: No  . Sexual Activity: No     Comment: BTL   Other Topics Concern  . 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 Sees dentist on a regular basis.    etoh 3 x per week                 Outpatient Encounter Prescriptions as of 02/08/2015  Medication Sig  . doxycycline (VIBRA-TABS) 100 MG tablet Take 1 tablet (100 mg total) by mouth 2 (two) times daily.    EXAM:  BP 118/68 mmHg  Temp(Src) 97.9 F (36.6 C) (Oral)  Ht 5\' 4"  (1.626 m)  Wt 139 lb 9.6 oz (63.322 kg)  BMI 23.95 kg/m2  LMP 10/28/1995  Body mass index is 23.95 kg/(m^2). WDWN in NAD  quiet respirations; mildly congested  somewhat hoarse. Non toxic . HEENT: Normocephalic ;atraumatic , Eyes;  PERRL, EOMs  Full, lids and conjunctiva slight redness right no edema ,Ears: no deformities, canals nl,   Tube right hearing aids  Otherwise TM landmarks normal, Nose: no deformity or discharge but congested;face r frontal tenderness  tender Mouth : OP clear without lesion or edema . Neck: Supple without adenopathy or masses or bruits tender ac area  Chest:  Clear to A&P without wheezes rales or rhonchi but rare tubular sound gone with cough  CV:  S1-S2 no gallops or murmurs peripheral perfusion is normal Skin :nl perfusion and no acute rashes  PSYCH: pleasant and cooperative, no obvious depression or anxiety  ASSESSMENT AND PLAN:  Discussed the following assessment and plan:  Acute sinusitis with symptoms greater than 10 days - r frontal   Protracted URI Expectant management. options discussed . -Patient advised  to return or notify health care team  if symptoms worsen ,persist or new concerns arise.  Patient Instructions  Continue nasal sinus hygiene. Add antibiotic     Expect improvement in the facial pressure in 3-5 days  Cough for another 1-2 weeks   Contact us if needed in the interim. Sinusitis Sinusitis is redness, soreness, and inflammation of the paranasal sinuses. Paranasal sinuses are air pockets within the bones of your face (beneath the eyes, the middle of the forehead, or above the eyes). In healthy paranasal sinuses, mucus is able to drain out, and air is able to circulate through them by way of your nose. However, when your paranasal sinuses are inflamed, mucus and air can become trapped. This can allow bacteria and other germs to grow and cause infection. Sinusitis can develop quickly and last only a short time (acute) or continue over a long period (chronic). Sinusitis that lasts for more than 12 weeks is considered chronic.  CAUSES  Causes of sinusitis include:  Allergies.  Structural abnormalities, such as displacement of the cartilage that separates your nostrils (deviated septum), which can decrease the air flow through your nose and sinuses and affect sinus drainage.  Functional abnormalities, such as when the small hairs (cilia) that line your sinuses and help remove mucus do not work properly or are not present. SIGNS AND SYMPTOMS  Symptoms of acute and chronic sinusitis are the same. The primary symptoms are pain and pressure around the affected sinuses. Other symptoms include:  Upper toothache.  Earache.  Headache.  Bad breath.  Decreased sense of smell and taste.  A cough, which worsens when you are lying flat.  Fatigue.  Fever.  Thick drainage from your nose, which often is green and may contain pus (purulent).  Swelling and warmth over the affected sinuses. DIAGNOSIS  Your health care provider will perform a physical exam. During the exam, your health  care provider may:  Look in your nose for signs of abnormal growths in your nostrils (nasal polyps).  Tap over the affected sinus to check for signs of infection.  View the inside of your sinuses (endoscopy) using an imaging device that has a light attached (endoscope). If your health care provider suspects that you have chronic sinusitis, one or more of the following tests may be recommended:  Allergy tests.  Nasal culture. A  sample of mucus is taken from your nose, sent to a lab, and screened for bacteria.  Nasal cytology. A sample of mucus is taken from your nose and examined by your health care provider to determine if your sinusitis is related to an allergy. TREATMENT  Most cases of acute sinusitis are related to a viral infection and will resolve on their own within 10 days. Sometimes medicines are prescribed to help relieve symptoms (pain medicine, decongestants, nasal steroid sprays, or saline sprays).  However, for sinusitis related to a bacterial infection, your health care provider will prescribe antibiotic medicines. These are medicines that will help kill the bacteria causing the infection.  Rarely, sinusitis is caused by a fungal infection. In theses cases, your health care provider will prescribe antifungal medicine. For some cases of chronic sinusitis, surgery is needed. Generally, these are cases in which sinusitis recurs more than 3 times per year, despite other treatments. HOME CARE INSTRUCTIONS   Drink plenty of water. Water helps thin the mucus so your sinuses can drain more easily.  Use a humidifier.  Inhale steam 3 to 4 times a day (for example, sit in the bathroom with the shower running).  Apply a warm, moist washcloth to your face 3 to 4 times a day, or as directed by your health care provider.  Use saline nasal sprays to help moisten and clean your sinuses.  Take medicines only as directed by your health care provider.  If you were prescribed either an  antibiotic or antifungal medicine, finish it all even if you start to feel better. SEEK IMMEDIATE MEDICAL CARE IF:  You have increasing pain or severe headaches.  You have nausea, vomiting, or drowsiness.  You have swelling around your face.  You have vision problems.  You have a stiff neck.  You have difficulty breathing. MAKE SURE YOU:   Understand these instructions.  Will watch your condition.  Will get help right away if you are not doing well or get worse. Document Released: 10/13/2005 Document Revised: 02/27/2014 Document Reviewed: 10/28/2011 [facility] Patient Information 2015 [place], [place]. This information is not intended to replace advice given to you by your health care provider. Make sure you discuss any questions you have with your health care provider.      Standley Brooking. Panosh M.D.

## 2015-02-08 NOTE — Patient Instructions (Signed)
Continue nasal sinus hygiene. Add antibiotic     Expect improvement in the facial pressure in 3-5 days  Cough for another 1-2 weeks   Contact us if needed in the interim. Sinusitis Sinusitis is redness, soreness, and inflammation of the paranasal sinuses. Paranasal sinuses are air pockets within the bones of your face (beneath the eyes, the middle of the forehead, or above the eyes). In healthy paranasal sinuses, mucus is able to drain out, and air is able to circulate through them by way of your nose. However, when your paranasal sinuses are inflamed, mucus and air can become trapped. This can allow bacteria and other germs to grow and cause infection. Sinusitis can develop quickly and last only a short time (acute) or continue over a long period (chronic). Sinusitis that lasts for more than 12 weeks is considered chronic.  CAUSES  Causes of sinusitis include:  Allergies.  Structural abnormalities, such as displacement of the cartilage that separates your nostrils (deviated septum), which can decrease the air flow through your nose and sinuses and affect sinus drainage.  Functional abnormalities, such as when the small hairs (cilia) that line your sinuses and help remove mucus do not work properly or are not present. SIGNS AND SYMPTOMS  Symptoms of acute and chronic sinusitis are the same. The primary symptoms are pain and pressure around the affected sinuses. Other symptoms include:  Upper toothache.  Earache.  Headache.  Bad breath.  Decreased sense of smell and taste.  A cough, which worsens when you are lying flat.  Fatigue.  Fever.  Thick drainage from your nose, which often is green and may contain pus (purulent).  Swelling and warmth over the affected sinuses. DIAGNOSIS  Your health care provider will perform a physical exam. During the exam, your health care provider may:  Look in your nose for signs of abnormal growths in your nostrils (nasal polyps).  Tap over  the affected sinus to check for signs of infection.  View the inside of your sinuses (endoscopy) using an imaging device that has a light attached (endoscope). If your health care provider suspects that you have chronic sinusitis, one or more of the following tests may be recommended:  Allergy tests.  Nasal culture. A sample of mucus is taken from your nose, sent to a lab, and screened for bacteria.  Nasal cytology. A sample of mucus is taken from your nose and examined by your health care provider to determine if your sinusitis is related to an allergy. TREATMENT  Most cases of acute sinusitis are related to a viral infection and will resolve on their own within 10 days. Sometimes medicines are prescribed to help relieve symptoms (pain medicine, decongestants, nasal steroid sprays, or saline sprays).  However, for sinusitis related to a bacterial infection, your health care provider will prescribe antibiotic medicines. These are medicines that will help kill the bacteria causing the infection.  Rarely, sinusitis is caused by a fungal infection. In theses cases, your health care provider will prescribe antifungal medicine. For some cases of chronic sinusitis, surgery is needed. Generally, these are cases in which sinusitis recurs more than 3 times per year, despite other treatments. HOME CARE INSTRUCTIONS   Drink plenty of water. Water helps thin the mucus so your sinuses can drain more easily.  Use a humidifier.  Inhale steam 3 to 4 times a day (for example, sit in the bathroom with the shower running).  Apply a warm, moist washcloth to your face 3 to 4 times  a day, or as directed by your health care provider.  Use saline nasal sprays to help moisten and clean your sinuses.  Take medicines only as directed by your health care provider.  If you were prescribed either an antibiotic or antifungal medicine, finish it all even if you start to feel better. SEEK IMMEDIATE MEDICAL CARE  IF:  You have increasing pain or severe headaches.  You have nausea, vomiting, or drowsiness.  You have swelling around your face.  You have vision problems.  You have a stiff neck.  You have difficulty breathing. MAKE SURE YOU:   Understand these instructions.  Will watch your condition.  Will get help right away if you are not doing well or get worse. Document Released: 10/13/2005 Document Revised: 02/27/2014 Document Reviewed: 10/28/2011 [facility] Patient Information 2015 [place], [place]. This information is not intended to replace advice given to you by your health care provider. Make sure you discuss any questions you have with your health care provider.

## 2015-02-12 ENCOUNTER — Telehealth: Payer: Self-pay | Admitting: Internal Medicine

## 2015-02-12 MED ORDER — HYDROCODONE-HOMATROPINE 5-1.5 MG/5ML PO SYRP: 5.0000 mL | ORAL_SOLUTION | Freq: Three times a day (TID) | ORAL | Status: DC | PRN

## 2015-02-12 NOTE — Telephone Encounter (Signed)
Patient would like a rx HYDROcodone-homatropine (HYCODAN) 5-1.5 MG/5ML syrup. She was using her husband's rx and it helped.

## 2015-02-12 NOTE — Telephone Encounter (Signed)
Pt's husband Jenny Reichmann notified Rx ready for pickup. Rx printed and signed by Dr. Regis Bill.

## 2015-02-27 ENCOUNTER — Encounter: Payer: Self-pay | Admitting: Internal Medicine

## 2015-02-27 ENCOUNTER — Ambulatory Visit (INDEPENDENT_AMBULATORY_CARE_PROVIDER_SITE_OTHER): Payer: Medicare Other | Admitting: Internal Medicine

## 2015-02-27 VITALS — BP 110/64 | Temp 98.1°F | Ht 64.0 in | Wt 138.3 lb

## 2015-02-27 DIAGNOSIS — Z974 Presence of external hearing-aid: Secondary | ICD-10-CM

## 2015-02-27 DIAGNOSIS — Z0000 Encounter for general adult medical examination without abnormal findings: Secondary | ICD-10-CM | POA: Diagnosis not present

## 2015-02-27 DIAGNOSIS — Z1211 Encounter for screening for malignant neoplasm of colon: Secondary | ICD-10-CM

## 2015-02-27 NOTE — Patient Instructions (Signed)
Continue lifestyle intervention healthy eating and exercise . Healthy lifestyle includes : At least 150 minutes of exercise weeks  , weight at healthy levels, which is usually   BMI 19-25. Avoid trans fats and processed foods;  Increase fresh fruits and veges to 5 servings per day. And avoid sweet beverages including tea and juice. Mediterranean diet with olive oil and nuts have been noted to be heart and brain healthy . Avoid tobacco products . Limit  alcohol to  7 per week for women and 14 servings for men.  Get adequate sleep . Wear seat belts . Don't text and drive .   Yearly flu vaccine  lert  [place] know if you want to get the prevnar 13 Yearly stool card check for colon cancer screening

## 2015-02-27 NOTE — Progress Notes (Signed)
Pre visit review using our clinic review tool, if applicable. No additional management support is needed unless otherwise documented below in the visit note.  Chief Complaint  Patient presents with  . Medicare Wellness    HPI: Laura Curtis 69 y.o. comes in today for Preventive Medicare wellness visit . See last visit for prolonged resp infection sinusitis  Feels well now . Doing well no rx meds    declined colonoscopy and mammogram  Neg fam hx  Skin survey yearly for skin cancer   Health Maintenance  Topic Date Due  . COLONOSCOPY  12/04/1993  . ZOSTAVAX  12/05/2003  . PNA vac Low Risk Adult (2 of 2 - PCV13) 07/27/2013  . INFLUENZA VACCINE  05/28/2015  . TETANUS/TDAP  01/13/2019  . DEXA SCAN  Completed   Health Maintenance Review LIFESTYLE:  Exercise:  Yes  gardening tennis walking   Tobacco/ETS: no Alcohol:  3 per week.  Sugar beverages: none  Sleep: about 7  Drug use: no Bone density:  2015 Colonoscopy:  Stool cards  Every other year. MEDICARE DOCUMENT QUESTIONS  TO SCAN    Hearing:  Pass but dec background  Had audiology evaluation. Middle spectrum out.  Hearing aids   Vision:  No limitations at present . Last eye check UTD  Safety:  Has smoke detector and wears seat belts.  No firearms. No excess sun exposure. Sees dentist regularly.  Falls:  no  Advance directive :  Reviewed  Has one.  Memory: Felt to be good  , no concern from her or her family.  Depression: No anhedonia unusual crying or depressive symptoms  Nutrition: Eats well balanced diet; adequate calcium and vitamin D. No swallowing chewing problems.  Injury: no major injuries in the last six months.  Other healthcare providers:  Reviewed today .  Social:  Lives with spouse married. No pets.   Preventive parameters: up-to-date  Reviewed   ADLS:   There are no problems or need for assistance  driving, feeding, obtaining food, dressing, toileting and bathing, managing money using phone. She is  independent.    ROS:  GEN/ HEENT: No fever, significant weight changes sweats headaches vision problems hearing changes, CV/ PULM; No chest pain shortness of breath cough, syncope,edema  change in exercise tolerance. GI /GU: No adominal pain, vomiting, change in bowel habits. No blood in the stool. No significant GU symptoms. SKIN/HEME: ,no acute skin rashes suspicious lesions or bleeding. No lymphadenopathy, nodules, masses.  NEURO/ PSYCH:  No neurologic signs such as weakness numbness. No depression anxiety. IMM/ Allergy: No unusual infections.  Allergy .   REST of 12 system review negative except as per HPI   Past Medical History  Diagnosis Date  . Hip pain, right 12/31/2011    A mild amount of arthritis is probable with the limitation of rotation but the key finding today was weakness in abduction   . Shoulder pain, left 12/31/2011    History of rotator cuff tear on the right and now has nontraumatic left shoulder pain   . Left rotator cuff tear 01/14/2012    Supraspinatus tear noted on ultrasound. Provided instructions regarding nitroglycerin patches 1/4 patch daily to shoulder.   To continue circular motion exercises and range of motion. No over the head exercises.  No weight bearing on that side. She can continue to play tennis as tolerated.  FU in 2 weeks if no improvement, 4 weeks if improved.      . Biceps tendinopathy 01/14/2012  .  Syncope     June, 2013  . 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  [name] Hx of varicella   . HX: benign breast biopsy   . Hearing aid worn   . Uterine prolapse   . Hepatitis A   . 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 Stroke Father     age 55  . Other Mother     old age died 50   . Deep vein thrombosis Mother     History   Social History  . Marital Status: Married    Spouse Name: N/A  . Number of Children: N/A  . Years of Education: N/A   Social History Main Topics  . Smoking  status: Never Smoker   . Smokeless tobacco: Never Used  . Alcohol Use: 1.0 - 1.5 oz/week    2-3 drink(s) per week     Comment: occ  . Drug Use: No  . Sexual Activity: No     Comment: BTL   Other Topics Concern  . 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 Sees dentist on a regular basis.    etoh 3 x per week                 Outpatient Encounter Prescriptions as of 02/27/2015  Medication Sig  . [DISCONTINUED] doxycycline (VIBRA-TABS) 100 MG tablet Take 1 tablet (100 mg total) by mouth 2 (two) times daily.  . [DISCONTINUED] HYDROcodone-homatropine (HYCODAN) 5-1.5 MG/5ML syrup Take 5 mLs by mouth every 8 (eight) hours as needed for cough.   No facility-administered encounter medications on file as of 02/27/2015.   Outpatient Prescriptions Prior to Visit  Medication Sig Dispense Refill  . doxycycline (VIBRA-TABS) 100 MG tablet Take 1 tablet (100 mg total) by mouth 2 (two) times daily. 14 tablet 0  . HYDROcodone-homatropine (HYCODAN) 5-1.5 MG/5ML syrup Take 5 mLs by mouth every 8 (eight) hours as needed for cough. 180 mL 0   No facility-administered medications prior to visit.    EXAM:  BP 110/64 mmHg  Temp(Src) 98.1 F (36.7 C) (Oral)  Ht 5\' 4"  (1.626 m)  Wt 138 lb 4.8 oz (62.732 kg)  BMI 23.73 kg/m2  LMP 10/28/1995  Body mass index is 23.73 kg/(m^2).  Physical Exam: Vital signs reviewed WNU:UVOZ is a well-developed well-nourished alert cooperative   who appears stated age in no acute distress.  HEENT: normocephalic atraumatic , Eyes: PERRL EOM's full, conjunctiva clear, Nares: paten,t no deformity discharge or tenderness., Ears: no deformity EAC's clear hearing aids  TMs with normal landmarks. Mouth: clear OP, no lesions, edema.  Moist mucous membranes. Dentition in adequate  repair. NECK: supple without masses, thyromegaly or bruits. CHEST/PULM:  Clear to auscultation and percussion breath sounds equal no wheeze , rales or rhonchi. No chest wall deformities or tenderness. CV: PMI is nondisplaced, S1 S2 no gallops, murmurs, rubs. Peripheral pulses are full without delay.No JVD . Breast: normal by inspection . No dimpling, discharge, masses, tenderness or discharge . ABDOMEN: Bowel sounds normal nontender  No guard or rebound, no hepato splenomegal no CVA tenderness.  No hernia.  Extremtities:  No clubbing cyanosis or edema, no acute joint swelling or redness no focal atrophy NEURO:  Oriented x3, cranial nerves 3-12 appear to be intact, no obvious focal weakness,gait within normal limits no abnormal reflexes or asymmetrical SKIN: No acute rashes normal turgor, color, no bruising or petechiae. Sun changes  PSYCH: Oriented, good eye contact, no obvious depression anxiety, cognition and judgment appear normal. LN: no cervical axillary inguinal adenopathy No noted deficits in memory, attention, and speech.     ASSESSMENT AND PLAN:  Discussed the following assessment and plan:  Medicare annual wellness visit, subsequent  Visit for preventive health examination  Special screening for malignant neoplasms, colon - Plan: Fecal occult blood, imunochemical  Hearing aid worn  not getting  mammos and decline prevnar 13  shingles vaccine last year  Will do  Stool cards   SDM Disc blood tests  No  Indication for screening based on hx  Keep yearly skin check  Flu vaccine  Patient Care Team: Burnis Medin, MD as PCP - General (Internal Medicine) Amy Martinique, MD as Consulting Physician (Dermatology) Megan Salon, MD as Consulting Physician (Gynecology)  Patient Instructions  Continue lifestyle intervention healthy eating and exercise . Healthy lifestyle includes : At least 150 minutes of exercise weeks  , weight at healthy levels, which is usually   BMI 19-25. Avoid  trans fats and processed foods;  Increase fresh fruits and veges to 5 servings per day. And avoid sweet beverages including tea and juice. Mediterranean diet with olive oil and nuts have been noted to be heart and brain healthy . Avoid tobacco products . Limit  alcohol to  7 per week for women and 14 servings for men.  Get adequate sleep . Wear seat belts . Don't text and drive .   Yearly flu vaccine  lert  [place] know if you want to get the prevnar 13 Yearly stool card check for colon cancer screening     Standley Brooking. Emmilee Reamer M.D.    Medication List    Notice  As of 02/27/2015  6:09 PM   You have not been prescribed any medications.

## 2015-03-05 ENCOUNTER — Other Ambulatory Visit (INDEPENDENT_AMBULATORY_CARE_PROVIDER_SITE_OTHER): Payer: Medicare Other

## 2015-03-05 DIAGNOSIS — Z1211 Encounter for screening for malignant neoplasm of colon: Secondary | ICD-10-CM

## 2015-03-05 LAB — HEMOCCULT GUIAC POC 1CARD (OFFICE)
Card #2 Fecal Occult Blod, POC: NEGATIVE
FECAL OCCULT BLD: NEGATIVE
Fecal Occult Blood, POC: NEGATIVE

## 2015-03-05 NOTE — Progress Notes (Signed)
Quick Note:  Inform patient stool test negative for blood . Routine follow. Yearly ______

## 2015-03-12 DIAGNOSIS — H2511 Age-related nuclear cataract, right eye: Secondary | ICD-10-CM | POA: Diagnosis not present

## 2015-03-12 DIAGNOSIS — H2513 Age-related nuclear cataract, bilateral: Secondary | ICD-10-CM | POA: Diagnosis not present

## 2015-04-06 ENCOUNTER — Ambulatory Visit (INDEPENDENT_AMBULATORY_CARE_PROVIDER_SITE_OTHER): Payer: Medicare Other | Admitting: Internal Medicine

## 2015-04-06 ENCOUNTER — Encounter: Payer: Self-pay | Admitting: Internal Medicine

## 2015-04-06 VITALS — BP 102/70 | Temp 98.2°F | Ht 64.0 in | Wt 136.4 lb

## 2015-04-06 DIAGNOSIS — J988 Other specified respiratory disorders: Secondary | ICD-10-CM | POA: Diagnosis not present

## 2015-04-06 DIAGNOSIS — R05 Cough: Secondary | ICD-10-CM | POA: Diagnosis not present

## 2015-04-06 DIAGNOSIS — R059 Cough, unspecified: Secondary | ICD-10-CM

## 2015-04-06 NOTE — Progress Notes (Signed)
Pre visit review using our clinic review tool, if applicable. No additional management support is needed unless otherwise documented below in the visit note.  Chief Complaint  Patient presents with  . Sinusitis    Cough is productive.    . Cough    HPI: Patient Laura Curtis  comes in today for SDA for  new problem evaluation. She reports / "legacy from spring  Congestion ."   Clear  Crust in left eye gone . In past few days now has a hard to produce  productive cough    Hard to cough up.  Bu in am able to play tennis  No cp sob  Seems to u upper resp area  No fever  .     To have cataract surgery  On Tuesday .  Able to play tennis  .  And work in garden.  Asks for assessment  ROS: See pertinent positives and negatives per HPI.  Past Medical History  Diagnosis Date  . Hip pain, right 12/31/2011    A mild amount of arthritis is probable with the limitation of rotation but the key finding today was weakness in abduction   . Shoulder pain, left 12/31/2011    History of rotator cuff tear on the right and now has nontraumatic left shoulder pain   . Left rotator cuff tear 01/14/2012    Supraspinatus tear noted on ultrasound. Provided instructions regarding nitroglycerin patches 1/4 patch daily to shoulder.   To continue circular motion exercises and range of motion. No over the head exercises.  No weight bearing on that side. She can continue to play tennis as tolerated.  FU in 2 weeks if no improvement, 4 weeks if improved.      . Biceps tendinopathy 01/14/2012  . Syncope     June, 2013  . 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  [name] Hx of varicella   . HX: benign breast biopsy   . Hearing aid worn   . Uterine prolapse   . Hepatitis A   . 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 Stroke Father     age 52  . Other Mother     old age died 65   . Deep vein thrombosis Mother     History   Social History  . Marital Status:  Married    Spouse Name: N/A  . Number of Children: N/A  . Years of Education: N/A   Social History Main Topics  . Smoking status: Never Smoker   . Smokeless tobacco: Never Used  . Alcohol Use: 1.0 - 1.5 oz/week    2-3 drink(s) per week     Comment: occ  . Drug Use: No  . Sexual Activity: No     Comment: BTL   Other Topics Concern  . 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 Sees dentist on a regular basis.    etoh 3 x per week                 No outpatient prescriptions prior to visit.   No facility-administered  medications prior to visit.     EXAM:  BP 102/70 mmHg  Temp(Src) 98.2 F (36.8 C) (Oral)  Ht 5\' 4"  (1.626 m)  Wt 136 lb 6.4 oz (61.871 kg)  BMI 23.40 kg/m2  LMP 10/28/1995  Body mass index is 23.4 kg/(m^2).  GENERAL: vitals reviewed and listed above, alert, oriented, appears well hydrated and in no acute distress HEENT: atraumatic, conjunctiva  clear, no obvious abnormalities on inspection of external nose and ears tmx clear nares min congestion face nt OP : no lesion edema or exudate  NECK: no obvious masses on inspection palpation  LUNGS: clear to auscultation bilaterally, no wheezes, rales or rhonchi, good air movement CV: HRRR, no clubbing cyanosis or  peripheral edema nl cap refill  MS: moves all extremities without noticeable focal  abnormality PSYCH: pleasant and cooperative, no obvious depression or anxiety  ASSESSMENT AND PLAN:  Discussed the following assessment and plan:  Congestion of upper airway  Cough Suspect irritable airway but  Did have prolonged rti  Exam reassuring   consider x  Ray if  persistent or progressive See below instructions  -Patient advised to return or notify health care team  if symptoms worsen ,persist or new concerns  arise.  Patient Instructions  Your lung exam is normal today no signs of pneumonia or bacterial infection. Some of your symptoms may be from irritable airway and postnasal drainage. Begin nasal cortisone either Flonase or Nasacort 2 sprays in each nostril every day to hopefully decrease the sinus congestion and postnasal drainage and thus the cough. You may also add an over-the-counter any histamines such as generic Allegra generic Claritin or generic Zyrtec.  Contact the surgery office to make him aware of your coughing situation. Your cough may not be gone by next week.  If cough is persistent progressive shortness of breath or fever get back with [place] consider chest x-ray.     Standley Brooking. Panosh M.D.

## 2015-04-06 NOTE — Patient Instructions (Signed)
Your lung exam is normal today no signs of pneumonia or bacterial infection. Some of your symptoms may be from irritable airway and postnasal drainage. Begin nasal cortisone either Flonase or Nasacort 2 sprays in each nostril every day to hopefully decrease the sinus congestion and postnasal drainage and thus the cough. You may also add an over-the-counter any histamines such as generic Allegra generic Claritin or generic Zyrtec.  Contact the surgery office to make him aware of your coughing situation. Your cough may not be gone by next week.  If cough is persistent progressive shortness of breath or fever get back with [place] consider chest x-ray.

## 2015-04-10 DIAGNOSIS — H2511 Age-related nuclear cataract, right eye: Secondary | ICD-10-CM | POA: Diagnosis not present

## 2015-04-11 DIAGNOSIS — H2512 Age-related nuclear cataract, left eye: Secondary | ICD-10-CM | POA: Diagnosis not present

## 2015-04-17 DIAGNOSIS — H2512 Age-related nuclear cataract, left eye: Secondary | ICD-10-CM | POA: Diagnosis not present

## 2015-04-23 ENCOUNTER — Other Ambulatory Visit: Payer: Self-pay

## 2015-06-05 ENCOUNTER — Telehealth: Payer: Self-pay | Admitting: Internal Medicine

## 2015-06-05 NOTE — Telephone Encounter (Signed)
Pt needs to be seen before a mammo can be ordered.  Please have her make an appt.  WP not in the office until next Tuesday.  May see another provider if she does not want to wait.  Thanks!

## 2015-06-05 NOTE — Telephone Encounter (Signed)
Patient states that she has been have pain in her right breast in the nipple area x 2days. Patient states that she did a self examination and didn't feel anything. Patient would like to know if she needs to see the MD or schedule a mammography. Please give patient a call back.

## 2015-06-05 NOTE — Telephone Encounter (Signed)
Patient has been schedule.

## 2015-06-07 ENCOUNTER — Ambulatory Visit (INDEPENDENT_AMBULATORY_CARE_PROVIDER_SITE_OTHER): Payer: Medicare Other | Admitting: Adult Health

## 2015-06-07 ENCOUNTER — Encounter: Payer: Self-pay | Admitting: Adult Health

## 2015-06-07 ENCOUNTER — Ambulatory Visit (INDEPENDENT_AMBULATORY_CARE_PROVIDER_SITE_OTHER)
Admission: RE | Admit: 2015-06-07 | Discharge: 2015-06-07 | Disposition: A | Discharge status: Home / Self Care | Payer: Medicare Other | Source: Ambulatory Visit | Attending: Adult Health | Admitting: Adult Health

## 2015-06-07 VITALS — BP 120/80 | Temp 98.5°F | Ht 64.0 in | Wt 139.5 lb

## 2015-06-07 DIAGNOSIS — R079 Chest pain, unspecified: Secondary | ICD-10-CM | POA: Diagnosis not present

## 2015-06-07 DIAGNOSIS — R0789 Other chest pain: Secondary | ICD-10-CM

## 2015-06-07 DIAGNOSIS — N644 Mastodynia: Secondary | ICD-10-CM

## 2015-06-07 LAB — CBC WITH DIFFERENTIAL/PLATELET
BASOS ABS: 0 10*3/uL (ref 0.0–0.1)
BASOS PCT: 0.4 % (ref 0.0–3.0)
Eosinophils Absolute: 0.1 10*3/uL (ref 0.0–0.7)
Eosinophils Relative: 0.8 % (ref 0.0–5.0)
HCT: 42.6 % (ref 36.0–46.0)
HEMOGLOBIN: 14.4 g/dL (ref 12.0–15.0)
LYMPHS PCT: 24.3 % (ref 12.0–46.0)
Lymphs Abs: 1.6 10*3/uL (ref 0.7–4.0)
MCHC: 33.9 g/dL (ref 30.0–36.0)
MCV: 88.9 fl (ref 78.0–100.0)
MONOS PCT: 5.9 % (ref 3.0–12.0)
Monocytes Absolute: 0.4 10*3/uL (ref 0.1–1.0)
NEUTROS ABS: 4.5 10*3/uL (ref 1.4–7.7)
NEUTROS PCT: 68.6 % (ref 43.0–77.0)
Platelets: 244 10*3/uL (ref 150.0–400.0)
RBC: 4.79 Mil/uL (ref 3.87–5.11)
RDW: 12.8 % (ref 11.5–15.5)
WBC: 6.6 10*3/uL (ref 4.0–10.5)

## 2015-06-07 LAB — BASIC METABOLIC PANEL WITH GFR
BUN: 17 mg/dL (ref 6–23)
CO2: 29 meq/L (ref 19–32)
Calcium: 9.5 mg/dL (ref 8.4–10.5)
Chloride: 104 meq/L (ref 96–112)
Creatinine, Ser: 0.74 mg/dL (ref 0.40–1.20)
GFR: 82.11 mL/min
Glucose, Bld: 90 mg/dL (ref 70–99)
Potassium: 4.2 meq/L (ref 3.5–5.1)
Sodium: 140 meq/L (ref 135–145)

## 2015-06-07 NOTE — Progress Notes (Signed)
Subjective:    Patient ID: Laura Curtis, female    DOB: [birth year], 69 y.o.   MRN: [id number]  HPI 69 year old female who presents to the office for vague complaint of  right breast pain since Monday, and tightness across the chest, since yesterday,  She states that the tightness across the chest feels " like a metal strap around my chest.". She denies any SOB or jaw pain. She does endorse occassional left arm pain that feels "nagging"   The right breast pain feels " tender".   She does have right upper back muscle pain that also feels "tender" to touch.   She has not noticed any lumps, bumps, dimpling, or discharge from nipple.   Has never had a mammogram  Denies any falls to trauma.    Review of Systems  Constitutional: Positive for fatigue. Negative for fever, chills, diaphoresis, activity change and appetite change.  Respiratory: Positive for chest tightness. Negative for cough, shortness of breath and wheezing.   Cardiovascular: Negative for chest pain, palpitations and leg swelling.  Gastrointestinal: Negative.   Musculoskeletal: Positive for myalgias and back pain. Negative for joint swelling, arthralgias, gait problem, neck pain and neck stiffness.  Neurological: Negative.   Psychiatric/Behavioral: Negative.   All other systems reviewed and are negative.  Past Medical History  Diagnosis Date  . Hip pain, right 12/31/2011    A mild amount of arthritis is probable with the limitation of rotation but the key finding today was weakness in abduction   . Shoulder pain, left 12/31/2011    History of rotator cuff tear on the right and now has nontraumatic left shoulder pain   . Left rotator cuff tear 01/14/2012    Supraspinatus tear noted on ultrasound. Provided instructions regarding nitroglycerin patches 1/4 patch daily to shoulder.   To continue circular motion exercises and range of motion. No over the head exercises.  No weight bearing on that side. She can continue to play  tennis as tolerated.  FU in 2 weeks if no improvement, 4 weeks if improved.      . Biceps tendinopathy 01/14/2012  . Syncope     June, 2013  . 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  [name] Hx of varicella   . HX: benign breast biopsy   . Hearing aid worn   . Uterine prolapse   . Hepatitis A   . 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 Marital Status: Married    Spouse Name: N/A  . Number of Children: N/A  . Years of Education: N/A   Occupational History  . Not on file.   Social History Main Topics  . Smoking status: Never Smoker   . Smokeless tobacco: Never Used  . Alcohol Use: 1.0 - 1.5 oz/week    2-3 drink(s) per week     Comment: occ  . Drug Use: No  . Sexual Activity: No     Comment: BTL   Other Topics Concern  . Not on file   Social History Narrative   hh of 2 married      retired radiation oncology asrt.   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 Sees dentist on a regular basis.    etoh  3 x per week                 Past Surgical History  Procedure Laterality Date  . Appendectomy  1968  . Rotator cuff repair  2001  . Breast biopsy  1998  . Tonsillectomy and adenoidectomy    . Ovarian cyst surgery  1968  . Tubal ligation    . Mohs surgery      for squamous cell ca left nose    Family History  Problem Relation Age of Onset  . Stroke Father     age 35  . Other Mother     old age died 69   . Deep vein thrombosis Mother     No Known Allergies  No current outpatient prescriptions on file prior to visit.   No current facility-administered medications on file prior to visit.    BP 120/80 mmHg  Temp(Src) 98.5 F (36.9 C) (Oral)  Ht 5\' 4"  (1.626 m)  Wt 139 lb 8 oz (63.277 kg)  BMI 23.93 kg/m2  LMP 10/28/1995       Objective:   Physical Exam  Constitutional: She is oriented to person, place, and  time. She appears well-developed and well-nourished. No distress.  Eyes: Conjunctivae and EOM are normal. Pupils are equal, round, and reactive to light. Right eye exhibits no discharge. Left eye exhibits no discharge.  Cardiovascular: Normal rate, regular rhythm, normal heart sounds and intact distal pulses.  Exam reveals no gallop and no friction rub.   No murmur heard. Pulmonary/Chest: Effort normal and breath sounds normal. No respiratory distress. She has no wheezes. She has no rales. She exhibits no tenderness.  Abdominal: Soft. Bowel sounds are normal. She exhibits no distension and no mass. There is no tenderness. There is no rebound and no guarding.  Genitourinary:  Breast Exam: No masses, lumps, dimpling or discharge. Tenderness to breast throughout right breast.   Musculoskeletal: Normal range of motion. She exhibits tenderness (right upper back, below scapula with palpation.). She exhibits no edema.  Neurological: She is alert and oriented to person, place, and time.  Skin: Skin is warm and dry. No rash noted. She is not diaphoretic. No erythema. No pallor.  Psychiatric: She has a normal mood and affect. Her behavior is normal. Judgment and thought content normal.  Vitals reviewed.      Assessment & Plan:  1. Chest tightness - Asthma vs Viral URI vs PNA  - EKG 12-Lead- SR, rate 70 - DG Chest 2 View; Future - CBC with Differential/Platelet - Basic metabolic panel - Go to the ER with chest pain or increased chest tightness 2. Breast pain - MSK in nature, possible radiating pain from upper right back. Cannot rule out malignancy  - CBC with Differential/Platelet - Basic metabolic panel - MM DIGITAL SCREENING BILATERAL; Future - Refused muscle relaxer at this time.

## 2015-06-07 NOTE — Progress Notes (Signed)
Pre visit review using our clinic review tool, if applicable. No additional management support is needed unless otherwise documented below in the visit note. 

## 2015-06-07 NOTE — Patient Instructions (Signed)
It was a pleasure meeting you today!   Please get your chest xray today and then schedule your mammogram.   I will follow up with you regarding your blood work.   Try Ibuopfren 600mg  every 8 hours for pain.   Follow up as needed

## 2015-06-22 DIAGNOSIS — L570 Actinic keratosis: Secondary | ICD-10-CM | POA: Diagnosis not present

## 2015-08-20 ENCOUNTER — Ambulatory Visit (INDEPENDENT_AMBULATORY_CARE_PROVIDER_SITE_OTHER): Payer: Medicare Other | Admitting: Family Medicine

## 2015-08-20 DIAGNOSIS — Z23 Encounter for immunization: Secondary | ICD-10-CM

## 2015-12-03 DIAGNOSIS — L814 Other melanin hyperpigmentation: Secondary | ICD-10-CM | POA: Diagnosis not present

## 2015-12-03 DIAGNOSIS — I8312 Varicose veins of left lower extremity with inflammation: Secondary | ICD-10-CM | POA: Diagnosis not present

## 2015-12-03 DIAGNOSIS — I8311 Varicose veins of right lower extremity with inflammation: Secondary | ICD-10-CM | POA: Diagnosis not present

## 2015-12-03 DIAGNOSIS — L821 Other seborrheic keratosis: Secondary | ICD-10-CM | POA: Diagnosis not present

## 2015-12-03 DIAGNOSIS — D225 Melanocytic nevi of trunk: Secondary | ICD-10-CM | POA: Diagnosis not present

## 2015-12-21 ENCOUNTER — Encounter: Payer: Self-pay | Admitting: Internal Medicine

## 2015-12-21 ENCOUNTER — Ambulatory Visit (INDEPENDENT_AMBULATORY_CARE_PROVIDER_SITE_OTHER): Payer: Medicare Other | Admitting: Internal Medicine

## 2015-12-21 ENCOUNTER — Ambulatory Visit: Payer: Medicare Other | Admitting: Internal Medicine

## 2015-12-21 VITALS — BP 114/72 | Temp 98.0°F | Wt 137.6 lb

## 2015-12-21 DIAGNOSIS — Z9181 History of falling: Secondary | ICD-10-CM | POA: Diagnosis not present

## 2015-12-21 DIAGNOSIS — Z23 Encounter for immunization: Secondary | ICD-10-CM

## 2015-12-21 DIAGNOSIS — S8001XA Contusion of right knee, initial encounter: Secondary | ICD-10-CM

## 2015-12-21 NOTE — Progress Notes (Signed)
Pre visit review using our clinic review tool, if applicable. No additional management support is needed unless otherwise documented below in the visit note.  Chief Complaint  Patient presents with  . Knee Injury    [place] [address] 2 wks ago on a wet floor in a hotel.    HPI: Patient TALULLA KIRSTEIN  comes in today for SDA for  new problem evaluation.   3 weeks ago  Feel on wet br floor   [facility].  Bruised  Got up and  Walked around . Without difficulty. Was sore but no majorly swelling. Was able to do normal activity and since that time is going to the gym and able to work out. However there is any area with immediate near the medial inferior kneecap that is very tender when palpated. Comes in today to get checked. No feeling of instability falling clicking and giving out. ROS: See pertinent positives and negatives per HPI.  Past Medical History  Diagnosis Date  . Hip pain, right 12/31/2011    A mild amount of arthritis is probable with the limitation of rotation but the key finding today was weakness in abduction   . Shoulder pain, left 12/31/2011    History of rotator cuff tear on the right and now has nontraumatic left shoulder pain   . Left rotator cuff tear 01/14/2012    Supraspinatus tear noted on ultrasound. Provided instructions regarding nitroglycerin patches 1/4 patch daily to shoulder.   To continue circular motion exercises and range of motion. No over the head exercises.  No weight bearing on that side. She can continue to play tennis as tolerated.  FU in 2 weeks if no improvement, 4 weeks if improved.      . Biceps tendinopathy 01/14/2012  . Syncope     June, 2013  . 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  [name] Hx of varicella   . HX: benign breast biopsy   . Hearing aid worn   . Uterine prolapse   . Hepatitis A   . 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 Stroke Father     age 34  . Other Mother     old age died 79    . Deep vein thrombosis Mother     Social History   Social History  . Marital Status: Married    Spouse Name: N/A  . Number of Children: N/A  . Years of Education: N/A   Social History Main Topics  . Smoking status: Never Smoker   . Smokeless tobacco: Never Used  . Alcohol Use: 1.0 - 1.5 oz/week    2-3 drink(s) per week     Comment: occ  . Drug Use: No  . Sexual Activity: No     Comment: BTL   Other Topics Concern  . 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 Sees dentist on a regular basis.    etoh 3 x per week                 No outpatient prescriptions prior to visit.   No facility-administered  medications prior to visit.     EXAM:  BP 114/72 mmHg  Temp(Src) 98 F (36.7 C) (Oral)  Wt 137 lb 9.6 oz (62.415 kg)  LMP 10/28/1995  Body mass index is 23.61 kg/(m^2).  GENERAL: vitals reviewed and listed above, alert, oriented, appears well hydrated and in no acute distress HEENT: atraumatic, conjunctiva  clear, no obvious abnormalities on inspection of external nose and ears MS: moves all extremities  has a normal gait. Right knee has some minimal peripatellar effusion swelling with no redness or warmth. There is a tender area in the inferior medial patellar area not on the tibial plateau. Range of motion normal mild crepitus anterior drawer appears symmetrical no joint line tenderness. PSYCH: pleasant and cooperative, no obvious depression or anxiety  ASSESSMENT AND PLAN:  Discussed the following assessment and plan:  Knee contusion, right, initial encounter  Need for vaccination with 13-polyvalent pneumococcal conjugate vaccine - Plan: Pneumococcal conjugate vaccine 13-valent  History of fall - slipped on wet floor in hotel room fell forward options discussed can do x  ray or wait if not improving   Since fucntioning well continue and fu if persistent  -Patient advised to return or notify health care team  if symptoms worsen ,persist or new concerns arise.  Patient Instructions  Acts like contusion left knee near knee cap  Since function and exam otherwise ok  No evidence of internal derangement of you knee .  Ice a tend of day  Continue activity as tolerated  If  persistent or progressive in another 3-4 weeks   Consider x ray an fuy.  Check up preventive visit in may June    Wanda K. Panosh M.D.

## 2015-12-21 NOTE — Patient Instructions (Signed)
Acts like contusion left knee near knee cap  Since function and exam otherwise ok  No evidence of internal derangement of you knee .  Ice a tend of day  Continue activity as tolerated  If  persistent or progressive in another 3-4 weeks   Consider x ray an fuy.  Check up preventive visit in may June

## 2015-12-28 ENCOUNTER — Ambulatory Visit (INDEPENDENT_AMBULATORY_CARE_PROVIDER_SITE_OTHER): Payer: Medicare Other | Admitting: Obstetrics & Gynecology

## 2015-12-28 ENCOUNTER — Encounter: Payer: Self-pay | Admitting: Obstetrics & Gynecology

## 2015-12-28 VITALS — BP 102/66 | HR 58 | Resp 14 | Ht 64.0 in | Wt 138.0 lb

## 2015-12-28 DIAGNOSIS — Z124 Encounter for screening for malignant neoplasm of cervix: Secondary | ICD-10-CM | POA: Diagnosis not present

## 2015-12-28 DIAGNOSIS — Z01419 Encounter for gynecological examination (general) (routine) without abnormal findings: Secondary | ICD-10-CM

## 2015-12-28 NOTE — Progress Notes (Signed)
70 y.o. [id number] MarriedCaucasianF here for annual exam.  Doing well.  No vaginal bleeding.  D/W pt Hep C testing.  Pt[facility] within the last ten years.     Patient's last menstrual period was 10/28/1995.          Sexually active: No.  The current method of family planning is post menopausal status.    Exercising: Yes.    Jump rope, cardio workout  Smoker:  no  Health Maintenance: Pap:  11/21/13 Neg History of abnormal Pap:  no MMG:  1995  Colonoscopy:  None--declines BMD:  2015 Normal  TDaP:  12/2008  Screening Labs: PCP, Urine today: PCP Pneumonia: Completed x 2 Shinges:  Declined   reports that she has never smoked. She has never used smokeless tobacco. She reports that she drinks about 1.0 - 1.5 oz of alcohol per week. She reports that she does not use illicit drugs.  Past Medical History  Diagnosis Date  . Hip pain, right 12/31/2011    A mild amount of arthritis is probable with the limitation of rotation but the key finding today was weakness in abduction   . Shoulder pain, left 12/31/2011    History of rotator cuff tear on the right and now has nontraumatic left shoulder pain   . Left rotator cuff tear 01/14/2012    Supraspinatus tear noted on ultrasound. Provided instructions regarding nitroglycerin patches 1/4 patch daily to shoulder.   To continue circular motion exercises and range of motion. No over the head exercises.  No weight bearing on that side. She can continue to play tennis as tolerated.  FU in 2 weeks if no improvement, 4 weeks if improved.      . Biceps tendinopathy 01/14/2012  . Syncope     June, 2013  . 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  [name] Hx of varicella   . HX: benign breast biopsy   . Hearing aid worn   . Uterine prolapse   . Hepatitis A   . 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 Appendectomy  1968  . Rotator cuff repair  2001  . Breast biopsy  1998  .  Tonsillectomy and adenoidectomy    . Ovarian cyst surgery  1968  . Tubal ligation    . Mohs surgery      for squamous cell ca left nose    No current outpatient prescriptions on file.   No current facility-administered medications for this visit.    Family History  Problem Relation Age of Onset  . Stroke Father     age 51  . Other Mother     old age died 50   . Deep vein thrombosis Mother     ROS:  Pertinent items are noted in HPI.  Otherwise, a comprehensive ROS was negative.  Exam:   BP 102/66 mmHg  Pulse 58  Resp 14  Ht 5\' 4"  (1.626 m)  Wt 138 lb (62.596 kg)  BMI 23.68 kg/m2  LMP 10/28/1995  Weight change: +1#   Height: 5\' 4"  (162.6 cm)  Ht Readings from Last 3 Encounters:  12/28/15 5\' 4"  (1.626 m)  06/07/15 5\' 4"  (1.626 m)  04/06/15 5\' 4"  (1.626 m)    General appearance: alert, cooperative and appears stated age Head: Normocephalic, without obvious abnormality, atraumatic Neck: no adenopathy, supple, symmetrical, trachea midline and  thyroid normal to inspection and palpation Lungs: clear to auscultation bilaterally Breasts: normal appearance, no masses or tenderness Heart: regular rate and rhythm Abdomen: soft, non-tender; bowel sounds normal; no masses,  no organomegaly Extremities: extremities normal, atraumatic, no cyanosis or edema Skin: Skin color, texture, turgor normal. No rashes or lesions Lymph nodes: Cervical, supraclavicular, and axillary nodes normal. No abnormal inguinal nodes palpated Neurologic: Grossly normal   Pelvic: External genitalia:  no lesions              Urethra:  normal appearing urethra with no masses, tenderness or lesions              Bartholins and Skenes: normal                 Vagina: normal appearing vagina with normal color and discharge, no lesions, 1st degree cystocele and 2nd degree rectocele              Cervix: no lesions              Pap taken: Yes.   Bimanual Exam:  Uterus:  normal size, contour, position,  consistency, mobility, non-tender              Adnexa: normal adnexa and no mass, fullness, tenderness               Rectovaginal: Confirms               Anus:  normal sphincter tone, no lesions  Chaperone was present for exam.  A:  Well Woman with normal exam PMP Uterine prolapse with rectocele, uses #3 incontinence ring with support LS&A.  Pt not using any topical steroids and does not desire RF.  P: Pap obtained today. Declines MMG,and Colonoscopy.  Pt is aware these screening tests have been proven to save lives but she states, "if my time is up, my time is up" Do not feel she needs testing for Hep C as has [facility] last 10 years. No premarin RF as pt has not done MMG New #3 incontinence ring with support pessary will be ordered for pt. return 2 years or prn

## 2015-12-31 LAB — IPS PAP SMEAR ONLY

## 2016-01-09 ENCOUNTER — Telehealth: Payer: Self-pay | Admitting: *Deleted

## 2016-01-09 DIAGNOSIS — N8110 Cystocele, unspecified: Secondary | ICD-10-CM | POA: Diagnosis not present

## 2016-01-09 DIAGNOSIS — N816 Rectocele: Secondary | ICD-10-CM | POA: Diagnosis not present

## 2016-01-09 NOTE — Telephone Encounter (Signed)
Call to patient. Notified new pessary has arrived and is ready for pickup. Milex #3 Ring with support pessary left at front desk for patient.  Patient states she will pick-up Thursday or Friday this week.  Routing to provider for final review. Patient agreeable to disposition. Will close encounter.

## 2016-01-15 ENCOUNTER — Telehealth: Payer: Self-pay | Admitting: Internal Medicine

## 2016-01-15 DIAGNOSIS — S8001XA Contusion of right knee, initial encounter: Secondary | ICD-10-CM

## 2016-01-15 NOTE — Telephone Encounter (Signed)
Pt states her knee is still hurting and Dr Regis Bill told her she may need an xray if it continues. Pt would like to know if Dr Regis Bill would order xray, or should she get referral to an orthopedic. Pt states this has been going on 3 1/2 weeks. Please advise.

## 2016-01-15 NOTE — Telephone Encounter (Signed)
Either way is fine   If not any better  Can refer to ortho practice  of choice or sports medicine.

## 2016-01-16 NOTE — Telephone Encounter (Signed)
Spoke to the pt.  She would like a referral to Dr. Vickey Huger at [facility]. Order placed in the system.

## 2016-02-29 ENCOUNTER — Ambulatory Visit: Payer: Medicare Other | Admitting: Family Medicine

## 2016-04-01 ENCOUNTER — Ambulatory Visit (INDEPENDENT_AMBULATORY_CARE_PROVIDER_SITE_OTHER): Payer: Medicare Other | Admitting: Sports Medicine

## 2016-04-01 ENCOUNTER — Encounter: Payer: Self-pay | Admitting: Sports Medicine

## 2016-04-01 VITALS — BP 103/65 | Ht 65.0 in | Wt 132.0 lb

## 2016-04-01 DIAGNOSIS — M23231 Derangement of other medial meniscus due to old tear or injury, right knee: Secondary | ICD-10-CM | POA: Diagnosis not present

## 2016-04-01 DIAGNOSIS — M23203 Derangement of unspecified medial meniscus due to old tear or injury, right knee: Secondary | ICD-10-CM | POA: Insufficient documentation

## 2016-04-01 NOTE — Progress Notes (Signed)
Patient ID: Laura Curtis, female   DOB: 19-Aug-1946, 70 y.o.   MRN: [id number]  RT knee injurry  Jan. 2017 slipped on wet floor Knee painful and felt unsteady at first Gradually better over next 2 mos  Now giving out going down stairs No locking or giving way during activity  Pain is along the medial joint States the pain level is 1-2 but can get up to 4-5  Social history: lives with husband. Active in walking gardening and tennis; no smoking  Review of systems No knee swelling No pain in the left knee No pain or swelling in other joints Denies neurologic symptoms  Physical exam Physically fit lady in no acute distress BP 103/65 mmHg  Ht 5\' 5"  (1.651 m)  Wt 132 lb (59.875 kg)  BMI 21.97 kg/m2  LMP 10/28/1995  RT Knee: Normal to inspection with no erythema or effusion or obvious bony abnormalities. Palpation normal with no warmth Mild medial joint line tenderness and clicking No patellar tenderness or condyle tenderness. ROM normal in flexion and extension and lower leg rotation. Ligaments with solid consistent endpoints including ACL, PCL, LCL, MCL. Negative Mcmurray's Only mildy provocative Thessaly Non painful patellar compression. Patellar and quadriceps tendons unremarkable. Hamstring and quadriceps strength is normal.  Ultrasound of right knee There is a pseudo-meniscal cyst and a small calcified meniscal fragment on the medial RT joint line this is not seen on the left knee No supra patellar pouch effusion Normal patellar and quadriceps tendon Normal lateral meniscus Medial posterior meniscus also shows a calcific line through the meniscus Minimal spurring is noted bilaterally

## 2016-04-01 NOTE — Assessment & Plan Note (Signed)
She has hip abduction weakness only on the right side We will strength in hip abductors Quadriceps exercises and use the stationary bike 4 times per week  Compression sleeve Ice  Okay to continue tennis and other exercises  Recheck 2 months

## 2016-04-01 NOTE — Patient Instructions (Signed)
You have a split medial meniscus and some early arthritis more on RT than Lt  I would suggest you use a compression sleeve for tennis/ maybe for hiking  Exercises Biking 3 x per week Add a series of straight leg lifts Add some partial squats Add some 1 leg mini- knee bend  Repeat ultrasound in 3 months as well as exam

## 2016-05-04 ENCOUNTER — Encounter (HOSPITAL_COMMUNITY): Payer: Self-pay | Admitting: *Deleted

## 2016-05-04 ENCOUNTER — Ambulatory Visit (HOSPITAL_COMMUNITY)
Admission: EM | Admit: 2016-05-04 | Discharge: 2016-05-04 | Disposition: A | Discharge status: Home / Self Care | Payer: Medicare Other | Attending: Family Medicine | Admitting: Family Medicine

## 2016-05-04 DIAGNOSIS — L237 Allergic contact dermatitis due to plants, except food: Secondary | ICD-10-CM | POA: Diagnosis not present

## 2016-05-04 MED ORDER — METHYLPREDNISOLONE ACETATE 80 MG/ML IJ SUSP
80.0000 mg | Freq: Once | INTRAMUSCULAR | Status: AC
  Administered 2016-05-04: 80 mg via INTRAMUSCULAR

## 2016-05-04 MED ORDER — METHYLPREDNISOLONE ACETATE 80 MG/ML IJ SUSP
INTRAMUSCULAR | Status: AC
  Filled 2016-05-04: qty 1

## 2016-05-04 MED ORDER — MOMETASONE FUROATE 0.1 % EX CREA: 1.0000 "application " | TOPICAL_CREAM | Freq: Every day | CUTANEOUS | Status: DC

## 2016-05-04 NOTE — ED Notes (Signed)
Pt   Reports   A  Rash     On  Face     Since  yest   No     New  meds  No  Known       Causative  Agent   Appears  In no  Acute  Distress       sitting  Upright  On  Exam table  Speaking in  Complete  sentances

## 2016-05-04 NOTE — ED Provider Notes (Signed)
CSN: [id number]     Arrival date & time 05/04/16  1155 History   First MD Initiated Contact with Patient 05/04/16 1206     Chief Complaint  Patient presents with  . Rash   (Consider location/radiation/quality/duration/timing/severity/associated sxs/prior Treatment) Patient is a 70 y.o. female presenting with rash. The history is provided by the patient.  Rash Location:  Face Facial rash location:  Face Quality: blistering, itchiness and redness   Onset quality:  Sudden Duration:  2 days Chronicity:  New Context: plant contact     Past Medical History  Diagnosis Date  . Hip pain, right 12/31/2011    A mild amount of arthritis is probable with the limitation of rotation but the key finding today was weakness in abduction   . Shoulder pain, left 12/31/2011    History of rotator cuff tear on the right and now has nontraumatic left shoulder pain   . Left rotator cuff tear 01/14/2012    Supraspinatus tear noted on ultrasound. Provided instructions regarding nitroglycerin patches 1/4 patch daily to shoulder.   To continue circular motion exercises and range of motion. No over the head exercises.  No weight bearing on that side. She can continue to play tennis as tolerated.  FU in 2 weeks if no improvement, 4 weeks if improved.      . Biceps tendinopathy 01/14/2012  . Syncope     June, 2013  . 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  [name] Hx of varicella   . HX: benign breast biopsy   . Hearing aid worn   . Uterine prolapse   . Hepatitis A   . 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 Appendectomy  1968  . Rotator cuff repair  2001  . Breast biopsy  1998  . Tonsillectomy and adenoidectomy    . Ovarian cyst surgery  1968  . Tubal ligation    . Mohs surgery      for squamous cell ca left nose   Family History  Problem Relation Age of Onset  . Stroke Father     age 48  . Other Mother     old age died 65   . Deep vein  thrombosis Mother    Social History  Substance Use Topics  . Smoking status: Never Smoker   . Smokeless tobacco: Never Used  . Alcohol Use: 1.0 - 1.5 oz/week    2-3 drink(s) per week     Comment: occ   OB History    Gravida Para Term Preterm AB TAB SAB Ectopic Multiple Living   5 3   2 2    3      Review of Systems  Constitutional: Negative.   HENT: Positive for facial swelling.   Skin: Positive for rash.  All other systems reviewed and are negative.   Allergies  Review of patient's allergies indicates no known allergies.  Home Medications   Prior to Admission medications   Medication Sig Start Date End Date Taking? Authorizing Provider  mometasone (ELOCON) 0.1 % cream Apply 1 application topically daily. 05/04/16   Billy Fischer, MD   Meds Ordered and Administered this Visit   Medications  methylPREDNISolone acetate (DEPO-MEDROL) injection 80 mg (not administered)    BP 130/80 mmHg  Pulse 78  Temp(Src) 98.6 F (37 C) (Oral)  Resp 18  SpO2 100%  LMP 10/28/1995 No data found.  Physical Exam  Constitutional: She appears well-developed and well-nourished. No distress.  Skin: Skin is warm and dry. Rash noted.  Cheeks and right forearm erythema and pruritis, streak on arm.  Nursing note and vitals reviewed.   ED Course  Procedures (including critical care time)  Labs Review Labs Reviewed - No data to display  Imaging Review No results found.   Visual Acuity Review  Right Eye Distance:   Left Eye Distance:   Bilateral Distance:    Right Eye Near:   Left Eye Near:    Bilateral Near:         MDM   1. Contact dermatitis due to poison ivy        Billy Fischer, MD 05/04/16 1215

## 2016-05-19 ENCOUNTER — Encounter: Payer: Self-pay | Admitting: Internal Medicine

## 2016-05-19 ENCOUNTER — Ambulatory Visit (INDEPENDENT_AMBULATORY_CARE_PROVIDER_SITE_OTHER): Payer: Medicare Other | Admitting: Internal Medicine

## 2016-05-19 VITALS — BP 116/80 | Temp 97.8°F | Ht 65.0 in | Wt 136.4 lb

## 2016-05-19 DIAGNOSIS — Z974 Presence of external hearing-aid: Secondary | ICD-10-CM

## 2016-05-19 DIAGNOSIS — Z79899 Other long term (current) drug therapy: Secondary | ICD-10-CM | POA: Diagnosis not present

## 2016-05-19 DIAGNOSIS — H6121 Impacted cerumen, right ear: Secondary | ICD-10-CM

## 2016-05-19 NOTE — Patient Instructions (Signed)
Recheck when due   Check int o estradiol estrace cream vs premarin  cost

## 2016-05-19 NOTE — Progress Notes (Signed)
Pre visit review using our clinic review tool, if applicable. No additional management support is needed unless otherwise documented below in the visit note.  Chief Complaint  Patient presents with  . Cerumen Impaction    HPI: Laura Curtis 70 y.o.  sda appt  For hearing issues     Told had wax in ears.  When we need to get hearing aids checked. Also has a question about cost of vaginal Premarin cream given by Dr. Sabra Curtis. No unusual ear symptoms but some decreased hearing recently. ROS: See pertinent positives and negatives per HPI.  Past Medical History:  Diagnosis Date  . Biceps tendinopathy 01/14/2012  . 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 Hearing aid worn   . Hepatitis A   . Hip pain, right 12/31/2011   A mild amount of arthritis is probable with the limitation of rotation but the key finding today was weakness in abduction   . 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  [name] Hx of varicella   . HX: benign breast biopsy   . Left rotator cuff tear 01/14/2012   Supraspinatus tear noted on ultrasound. Provided instructions regarding nitroglycerin patches 1/4 patch daily to shoulder.   To continue circular motion exercises and range of motion. No over the head exercises.  No weight bearing on that side. She can continue to play tennis as tolerated.  FU in 2 weeks if no improvement, 4 weeks if improved.      . Shoulder pain, left 12/31/2011   History of rotator cuff tear on the right and now has nontraumatic left shoulder pain   . Syncope    June, 2013  . Uterine prolapse     Family History  Problem Relation Age of Onset  . Stroke Father     age 43  . Other Mother     old age died 81   . Deep vein thrombosis Mother     Social History   Social History  . Marital status: Married    Spouse name: N/A  . Number of children: N/A  . Years of education: N/A   Social History Main Topics  . Smoking status: Never Smoker  . Smokeless tobacco: Never Used  . Alcohol use  1.0 - 1.5 oz/week    2 - 3 drink(s) per week     Comment: occ  . Drug use: No  . Sexual activity: No     Comment: BTL   Other Topics Concern  . Not on file   Social History Narrative   hh of 2 married      retired radiation oncology asrt.   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 Sees dentist on a regular basis.    etoh 3 x per week                 Outpatient Medications Prior to Visit  Medication Sig Dispense Refill  . mometasone (ELOCON) 0.1 % cream Apply 1 application topically daily. 45 g 0   No facility-administered medications prior to visit.      EXAM:  BP 116/80 (BP Location: Right Arm, Patient Position: Sitting, Cuff Size: Normal)   Temp 97.8 F (36.6 C) (Oral)   Ht 5\' 5"  (1.651 m)   Wt 136 lb 6.4 oz (61.9  kg)   LMP 10/28/1995   BMI 22.70 kg/m   Body mass index is 22.7 kg/m.  GENERAL: vitals reviewed and listed above, alert, oriented, appears well hydrated and in no acute distress HEENT: atraumatic, conjunctiva  clear, no obvious abnormalities on inspection of external nose and ears After irrigation left ear is clear ;right ear has slight amount of wax redraw and irrigated TM intact EAC canals are clear now. Tolerated well. OP : no lesion edema or exudate  PSYCH: pleasant and cooperative, no obvious depression or anxiety  ASSESSMENT AND PLAN:  Discussed the following assessment and plan:  Excess wax in ear, right - effecting hearing  canal cleard . tm intact   Hearing aid worn  Medication management Check into the cost of estradiol Estrace versus Premarin cream at various pharmacies and have Dr. Sabra Curtis change prescription if helpful. -Patient advised to return or notify health care team  if symptoms worsen ,persist or new concerns arise.  Patient Instructions  Recheck when due   Check int o estradiol estrace cream vs premarin  cost    Laura Laster K.  Curtis M.D.

## 2016-05-26 ENCOUNTER — Ambulatory Visit (INDEPENDENT_AMBULATORY_CARE_PROVIDER_SITE_OTHER): Payer: Medicare Other | Admitting: Internal Medicine

## 2016-05-26 ENCOUNTER — Encounter: Payer: Self-pay | Admitting: Internal Medicine

## 2016-05-26 VITALS — BP 122/72 | Temp 98.4°F | Ht 64.5 in | Wt 137.4 lb

## 2016-05-26 DIAGNOSIS — Z974 Presence of external hearing-aid: Secondary | ICD-10-CM

## 2016-05-26 DIAGNOSIS — Z1211 Encounter for screening for malignant neoplasm of colon: Secondary | ICD-10-CM | POA: Diagnosis not present

## 2016-05-26 DIAGNOSIS — Z0000 Encounter for general adult medical examination without abnormal findings: Secondary | ICD-10-CM | POA: Diagnosis not present

## 2016-05-26 MED ORDER — ZOSTER VACCINE LIVE 19400 UNT/0.65ML ~~LOC~~ SUSR: 0.6500 mL | Freq: Once | SUBCUTANEOUS | 0 refills | Status: AC

## 2016-05-26 MED ORDER — ESTRADIOL 0.1 MG/GM VA CREA: TOPICAL_CREAM | VAGINAL | 4 refills | Status: DC

## 2016-05-26 NOTE — Progress Notes (Signed)
Pre visit review using our clinic review tool, if applicable. No additional management support is needed unless otherwise documented below in the visit note.  Chief Complaint  Patient presents with  . Medicare Wellness    HPI: Laura Curtis 70 y.o. comes in today for Preventive Medicare wellness visit .Since last visit. Ears ok  Cost of estrace better than premarin can we do rx ?  Dr Sabra Heck gave her premarin   Health Maintenance  Topic Date Due  . COLONOSCOPY  05/26/2017 (Originally 12/04/1993)  . ZOSTAVAX  05/26/2017 (Originally 12/05/2003)  . Hepatitis C Screening  05/26/2017 (Originally [birth year])  . INFLUENZA VACCINE  05/27/2016  . TETANUS/TDAP  01/13/2019  . DEXA SCAN  Completed  . PNA vac Low Risk Adult  Completed   Health Maintenance Review LIFESTYLE:  TAD  ok Sugar beverages: no Sleep:10 - 6 am  Once or twice  Exercise  Yes reg     MEDICARE DOCUMENT QUESTIONS  TO SCAN   Hearing:  Has AIds   Vision:  No limitations at present . Last eye check UTD  Safety:  Has smoke detector and wears seat belts.  No firearms. No excess sun exposure. Sees dentist regularly.  Falls:  no  Advance directive :  Reviewed  Has one.  Memory: Felt to be good  , no concern from her or her family.  Depression: No anhedonia unusual crying or depressive symptoms  Nutrition: Eats well balanced diet; adequate calcium and vitamin D. No swallowing chewing problems.  Injury: no major injuries in the last six months.  Other healthcare providers:  Reviewed today .  Social:  Lives with spouse married.    Preventive parameters: up-to-date  Reviewed   ADLS:   There are no problems or need for assistance  driving, feeding, obtaining food, dressing, toileting and bathing, managing money using phone. She is independent.    ROS: left shoulder scalyu keratosis  scrathced off for a month  GEN/ HEENT: No fever, significant weight changes sweats headaches vision problems hearing changes, CV/  PULM; No chest pain shortness of breath cough, syncope,edema  change in exercise tolerance. GI /GU: No adominal pain, vomiting, change in bowel habits. No blood in the stool. No significant GU symptoms. SKIN/HEME: ,no acute skin rashes suspicious lesions or bleeding. No lymphadenopathy, nodules, masses.  NEURO/ PSYCH:  No neurologic signs such as weakness numbness. No depression anxiety. IMM/ Allergy: No unusual infections.  Allergy .   REST of 12 system review negative except as per HPI   Past Medical History:  Diagnosis Date  . Biceps tendinopathy 01/14/2012  . 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 Hearing aid worn   . Hepatitis A   . Hip pain, right 12/31/2011   A mild amount of arthritis is probable with the limitation of rotation but the key finding today was weakness in abduction   . 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  [name] Hx of varicella   . HX: benign breast biopsy   . Left rotator cuff tear 01/14/2012   Supraspinatus tear noted on ultrasound. Provided instructions regarding nitroglycerin patches 1/4 patch daily to shoulder.   To continue circular motion exercises and range of motion. No over the head exercises.  No weight bearing on that side. She can continue to play tennis as tolerated.  FU in 2 weeks if no improvement, 4 weeks if improved.      . Shoulder pain, left 12/31/2011  History of rotator cuff tear on the right and now has nontraumatic left shoulder pain   . Syncope    June, 2013  . Uterine prolapse     Family History  Problem Relation Age of Onset  . Stroke Father     age 76  . Other Mother     old age died 59   . Deep vein thrombosis Mother     Social History   Social History  . Marital status: Married    Spouse name: N/A  . Number of children: N/A  . Years of education: N/A   Social History Main Topics  . Smoking status: Never Smoker  . Smokeless tobacco: Never Used  . Alcohol use 1.0 - 1.5 oz/week    2 - 3 drink(s) per week      Comment: occ  . Drug use: No  . Sexual activity: No     Comment: BTL   Other Topics Concern  . 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 Sees dentist on a regular basis.    etoh 3 x per week                 Outpatient Encounter Prescriptions as of 05/26/2016  Medication Sig  . estradiol (ESTRACE) 0.1 MG/GM vaginal cream 1 applicator intravaginally  as directed  . Zoster Vaccine Live, PF, (ZOSTAVAX) 09811 UNT/0.65ML injection Inject 19,400 Units into the skin once.   No facility-administered encounter medications on file as of 05/26/2016.     EXAM:  BP 122/72 (BP Location: Right Arm, Patient Position: Sitting, Cuff Size: Normal)   Temp 98.4 F (36.9 C) (Oral)   Ht 5' 4.5" (1.638 m)   Wt 137 lb 6.4 oz (62.3 kg)   LMP 10/28/1995   BMI 23.22 kg/m   Body mass index is 23.22 kg/m.  Physical Exam: Vital signs reviewed [id number] is a well-developed well-nourished alert cooperative   who appears stated age in no acute distress.  HEENT: normocephalic atraumatic , Eyes: PERRL EOM's full, conjunctiva clear, Nares: paten,t no deformity discharge or tenderness., Ears: no deformity EAC's clear TMs with normal landmarks. Mouth: clear OP, no lesions, edema.  Moist mucous membranes. Dentition in adequate repair. NECK: supple without masses, thyromegaly or bruits. CHEST/PULM:  Clear to auscultation and percussion breath sounds equal no wheeze , rales or rhonchi. No chest wall deformities or tenderness. CV: PMI is nondisplaced, S1 S2 no gallops, murmurs, rubs. Peripheral pulses are full without delay.No JVD . Breast: normal by inspection . No dimpling, discharge, masses, tenderness or discharge . ABDOMEN: Bowel sounds normal nontender  No guard or rebound, no hepato splenomegal no CVA tenderness.     Extremtities:  No clubbing cyanosis or edema, no acute joint swelling or redness no focal atrophy NEURO:  Oriented x3, cranial nerves 3-12 appear to be intact, no obvious focal weakness,gait within normal limits no abnormal reflexes or asymmetrical SKIN: No acute rashes normal turgor, color, no bruising or petechiae. Sun changes   Left should er are  With faced  3 mm scale  And what looks like some depigmentation peel after  Sun exposures no scarring  PSYCH: Oriented, good eye contact, no obvious depression anxiety, cognition and judgment  appear normal. LN: no cervical axillary inguinal adenopathy No noted deficits in memory, attention, and speech.     ASSESSMENT AND PLAN:  Discussed the following assessment and plan:  Visit for preventive health examination - Plan: Basic metabolic panel, CBC with Differential/Platelet, Lipid panel, TSH, Hepatic function panel  Medicare annual wellness visit, subsequent  Special screening for malignant neoplasms, colon - Plan: Fecal occult blood, imunochemical  Hearing aid worn Labs   Make appt  zostavax  And trial change to less expensive    Vag cream options  and hep c screen  Declined  Patient Care Team: Burnis Medin, MD as PCP - General (Internal Medicine) Amy Martinique, MD as Consulting Physician (Dermatology) Megan Salon, MD as Consulting Physician (Gynecology)  Patient Instructions  Get stool screen Lab appt fasting  I will put in orders for preventive visit  Labs.  I f skin area progresses see derm.  Try generic vag estrogen .  For cost reasons.   Check up in a year  Or as needed     Standley Brooking. Frederick Klinger M.D.

## 2016-05-26 NOTE — Patient Instructions (Addendum)
Get stool screen Lab appt fasting  I will put in orders for preventive visit  Labs.  I f skin area progresses see derm.  Try generic vag estrogen .  For cost reasons.   Check up in a year  Or as needed

## 2016-06-05 ENCOUNTER — Encounter: Payer: Self-pay | Admitting: Sports Medicine

## 2016-06-05 ENCOUNTER — Ambulatory Visit (INDEPENDENT_AMBULATORY_CARE_PROVIDER_SITE_OTHER): Payer: Medicare Other | Admitting: Sports Medicine

## 2016-06-05 DIAGNOSIS — M23203 Derangement of unspecified medial meniscus due to old tear or injury, right knee: Secondary | ICD-10-CM

## 2016-06-05 DIAGNOSIS — M23231 Derangement of other medial meniscus due to old tear or injury, right knee: Secondary | ICD-10-CM | POA: Diagnosis not present

## 2016-06-05 NOTE — Progress Notes (Signed)
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 Palpation normal with no warmth or joint line tenderness or patellar tenderness or condyle tenderness. ROM normal in flexion and extension and lower leg rotation. Ligaments with solid consistent endpoints including ACL, PCL, LCL, MCL. Negative Mcmurray's and provocative meniscal tests. Non painful patellar compression. Patellar and quadriceps tendons unremarkable. Hamstring and quadriceps strength is normal.  Ultrasound Right knee shows no suprapatellar pouch effusion Lateral meniscus, quadriceps tendon and patellar tendon are normal Medial meniscus shows an area of degenerative arthritis and spurring along the midline with some loss of cartilage at the meniscus Pseudo-meniscal cyst in this area has resolved

## 2016-06-05 NOTE — Assessment & Plan Note (Signed)
This is much improved  I suggested we keep her on a home program but emphasize some biking, some quadriceps strengthening, periodic tennis and walking  Use compression sleeve as needed  Return to see me as needed

## 2016-06-10 ENCOUNTER — Other Ambulatory Visit: Payer: Medicare Other

## 2016-06-13 ENCOUNTER — Other Ambulatory Visit (INDEPENDENT_AMBULATORY_CARE_PROVIDER_SITE_OTHER): Payer: Medicare Other

## 2016-06-13 DIAGNOSIS — Z0000 Encounter for general adult medical examination without abnormal findings: Secondary | ICD-10-CM | POA: Diagnosis not present

## 2016-06-13 LAB — HEPATIC FUNCTION PANEL
ALBUMIN: 4.2 g/dL (ref 3.5–5.2)
ALT: 13 U/L (ref 0–35)
AST: 16 U/L (ref 0–37)
Alkaline Phosphatase: 67 U/L (ref 39–117)
Bilirubin, Direct: 0.1 mg/dL (ref 0.0–0.3)
TOTAL PROTEIN: 6.7 g/dL (ref 6.0–8.3)
Total Bilirubin: 0.4 mg/dL (ref 0.2–1.2)

## 2016-06-13 LAB — CBC WITH DIFFERENTIAL/PLATELET
BASOS ABS: 0 10*3/uL (ref 0.0–0.1)
Basophils Relative: 0.3 % (ref 0.0–3.0)
Eosinophils Absolute: 0 10*3/uL (ref 0.0–0.7)
Eosinophils Relative: 0.4 % (ref 0.0–5.0)
HEMATOCRIT: 42.8 % (ref 36.0–46.0)
HEMOGLOBIN: 14.6 g/dL (ref 12.0–15.0)
LYMPHS PCT: 13.8 % (ref 12.0–46.0)
Lymphs Abs: 1 10*3/uL (ref 0.7–4.0)
MCHC: 34.2 g/dL (ref 30.0–36.0)
MCV: 89 fl (ref 78.0–100.0)
MONOS PCT: 5.6 % (ref 3.0–12.0)
Monocytes Absolute: 0.4 10*3/uL (ref 0.1–1.0)
NEUTROS ABS: 6 10*3/uL (ref 1.4–7.7)
Neutrophils Relative %: 79.9 % — ABNORMAL HIGH (ref 43.0–77.0)
Platelets: 232 10*3/uL (ref 150.0–400.0)
RBC: 4.81 Mil/uL (ref 3.87–5.11)
RDW: 13.4 % (ref 11.5–15.5)
WBC: 7.4 10*3/uL (ref 4.0–10.5)

## 2016-06-13 LAB — LIPID PANEL
CHOL/HDL RATIO: 3
Cholesterol: 234 mg/dL — ABNORMAL HIGH (ref 0–200)
HDL: 88.8 mg/dL (ref >39.00)
LDL CALC: 136 mg/dL — AB (ref 0–99)
NONHDL: 144.96
Triglycerides: 46 mg/dL (ref 0.0–149.0)
VLDL: 9.2 mg/dL (ref 0.0–40.0)

## 2016-06-13 LAB — BASIC METABOLIC PANEL
BUN: 16 mg/dL (ref 6–23)
CALCIUM: 9.4 mg/dL (ref 8.4–10.5)
CO2: 30 meq/L (ref 19–32)
Chloride: 102 mEq/L (ref 96–112)
Creatinine, Ser: 0.75 mg/dL (ref 0.40–1.20)
GFR: 80.62 mL/min (ref >60.00)
Glucose, Bld: 84 mg/dL (ref 70–99)
Potassium: 3.9 mEq/L (ref 3.5–5.1)
SODIUM: 139 meq/L (ref 135–145)

## 2016-06-13 LAB — TSH: TSH: 3.41 u[IU]/mL (ref 0.35–4.50)

## 2016-06-23 DIAGNOSIS — Z961 Presence of intraocular lens: Secondary | ICD-10-CM | POA: Diagnosis not present

## 2016-06-23 DIAGNOSIS — H43399 Other vitreous opacities, unspecified eye: Secondary | ICD-10-CM | POA: Diagnosis not present

## 2016-06-23 DIAGNOSIS — H26491 Other secondary cataract, right eye: Secondary | ICD-10-CM | POA: Diagnosis not present

## 2016-07-07 DIAGNOSIS — H26492 Other secondary cataract, left eye: Secondary | ICD-10-CM | POA: Diagnosis not present

## 2016-11-11 ENCOUNTER — Telehealth: Payer: Self-pay | Admitting: Internal Medicine

## 2016-11-11 ENCOUNTER — Encounter: Payer: Self-pay | Admitting: Internal Medicine

## 2016-11-11 ENCOUNTER — Ambulatory Visit (INDEPENDENT_AMBULATORY_CARE_PROVIDER_SITE_OTHER): Payer: Medicare Other | Admitting: Internal Medicine

## 2016-11-11 VITALS — BP 106/66 | Temp 99.2°F | Ht 64.5 in | Wt 143.0 lb

## 2016-11-11 DIAGNOSIS — R5383 Other fatigue: Secondary | ICD-10-CM | POA: Diagnosis not present

## 2016-11-11 DIAGNOSIS — R6889 Other general symptoms and signs: Secondary | ICD-10-CM

## 2016-11-11 DIAGNOSIS — R05 Cough: Secondary | ICD-10-CM

## 2016-11-11 DIAGNOSIS — R6883 Chills (without fever): Secondary | ICD-10-CM | POA: Diagnosis not present

## 2016-11-11 DIAGNOSIS — R059 Cough, unspecified: Secondary | ICD-10-CM

## 2016-11-11 LAB — POCT INFLUENZA A/B
Influenza A, POC: NEGATIVE
Influenza B, POC: NEGATIVE

## 2016-11-11 NOTE — Progress Notes (Signed)
Chief Complaint  Patient presents with  . Cough    Started Saturday night  . Generalized Body Aches  . Fatigue  . Chills  . Nasal Congestion  . Feverish    HPI: Laura Curtis 71 y.o.  Comes in for sda appt  Is leaving for a VBAC at the weekend. 3 days ago had the onset of body aches chills nasal congestion and then cough. No documented fever symptomatic treatment. Still feeling bad no chest pain shortness of breath hemoptysis or documented fever or chills. She did have a flu shot this year. ROS: See pertinent positives and negatives per HPI.  Past Medical History:  Diagnosis Date  . Biceps tendinopathy 01/14/2012  . 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 Hearing aid worn   . Hepatitis A   . Hip pain, right 12/31/2011   A mild amount of arthritis is probable with the limitation of rotation but the key finding today was weakness in abduction   . 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  [name] Hx of varicella   . HX: benign breast biopsy   . Left rotator cuff tear 01/14/2012   Supraspinatus tear noted on ultrasound. Provided instructions regarding nitroglycerin patches 1/4 patch daily to shoulder.   To continue circular motion exercises and range of motion. No over the head exercises.  No weight bearing on that side. She can continue to play tennis as tolerated.  FU in 2 weeks if no improvement, 4 weeks if improved.      . Shoulder pain, left 12/31/2011   History of rotator cuff tear on the right and now has nontraumatic left shoulder pain   . Syncope    June, 2013  . Uterine prolapse     Family History  Problem Relation Age of Onset  . Stroke Father     age 43  . Other Mother     old age died 87   . Deep vein thrombosis Mother     Social History   Social History  . Marital status: Married    Spouse name: N/A  . Number of children: N/A  . Years of education: N/A   Social History Main Topics  . Smoking status: Never Smoker  . Smokeless tobacco: Never Used    . Alcohol use 1.0 - 1.5 oz/week    2 - 3 drink(s) per week     Comment: occ  . Drug use: No  . Sexual activity: No     Comment: BTL   Other Topics Concern  . 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 Sees dentist on a regular basis.    etoh 3 x per week                 Outpatient Medications Prior to Visit  Medication Sig Dispense Refill  . estradiol (ESTRACE) 0.1 MG/GM vaginal cream 1 applicator intravaginally  as directed 127.5 g 4   No facility-administered medications prior to visit.      EXAM:  BP 106/66 (BP Location: Left Arm, Patient Position: Sitting, Cuff Size: Normal)   Temp 99.2 F (37.3 C) (Oral)   Ht 5' 4.5" (  1.638 m)   Wt 143 lb (64.9 kg)   LMP 10/28/1995   BMI 24.17 kg/m   Body mass index is 24.17 kg/m. WDWN in NAD  quiet respirations; mildly congested  somewhat hoarse. Non toxic . HEENT: Normocephalic ;atraumatic , Eyes;  PERRL, EOMs  Full, lids and conjunctiva clear,,Ears: no deformities, canals nl, TM landmarks normal, Nose: no deformity or discharge but congested;face minimally tender Mouth : OP clear without lesion or edema . Neck: Supple without adenopathy or masses or bruits Chest:  Clear to A without wheezes rales or rhonchi CV:  S1-S2 no gallops or murmurs peripheral perfusion is normal Skin :nl perfusion and no acute rashes  Flu screen neg  ASSESSMENT AND PLAN:  Discussed the following assessment and plan:  Flu-like symptoms  Cough - Plan: POC Influenza A/B  Other fatigue - Plan: POC Influenza A/B  Chills - Plan: POC Influenza A/B No evidence of pneumonia on exam or history today however if fever worsening chills not getting better or worse contact us for reevaluation and consideration of chest x-ray. I believe she is outside the window to have  any significant   help with Tamiflu if  this is influenza. -Patient advised to return or notify health care team  if symptoms worsen ,persist or new concerns arise.  Patient Instructions  Your exam is reassuring    No pneumonia .   Acts llike low grade flu like illness .  Flu tests are  50 % neg rate .  dont think tamiflu would help because of timing     Usually best in first 24- 48 hours of sx .   Rest fluids  .   Body aches feverish feeling should be better by the end of the week but cough can last 14 days .  Contact us before weekend   If  Fever  Blood in phlegm or other concerns .       Standley Brooking. Deanna Boehlke M.D.

## 2016-11-11 NOTE — Patient Instructions (Signed)
Your exam is reassuring    No pneumonia .   Acts llike low grade flu like illness .  Flu tests are  50 % neg rate .  dont think tamiflu would help because of timing     Usually best in first 24- 48 hours of sx .   Rest fluids  .   Body aches feverish feeling should be better by the end of the week but cough can last 14 days .  Contact us before weekend   If  Fever  Blood in phlegm or other concerns .

## 2016-11-11 NOTE — Telephone Encounter (Signed)
Pt seen in OV.   

## 2016-11-11 NOTE — Telephone Encounter (Signed)
Pt called wanted to speak with Misty did not wanted to come in think she has the flu have scheduled the pt to come in.

## 2016-11-11 NOTE — Progress Notes (Signed)
Pre visit review using our clinic review tool, if applicable. No additional management support is needed unless otherwise documented below in the visit note. 

## 2016-11-20 ENCOUNTER — Ambulatory Visit (INDEPENDENT_AMBULATORY_CARE_PROVIDER_SITE_OTHER): Payer: Medicare Other | Admitting: Internal Medicine

## 2016-11-20 ENCOUNTER — Encounter: Payer: Self-pay | Admitting: Internal Medicine

## 2016-11-20 VITALS — BP 110/74 | Temp 97.6°F | Ht 64.5 in | Wt 140.0 lb

## 2016-11-20 DIAGNOSIS — J0100 Acute maxillary sinusitis, unspecified: Secondary | ICD-10-CM | POA: Diagnosis not present

## 2016-11-20 MED ORDER — AMOXICILLIN 500 MG PO CAPS: 500.0000 mg | ORAL_CAPSULE | Freq: Three times a day (TID) | ORAL | 0 refills | Status: DC

## 2016-11-20 NOTE — Progress Notes (Signed)
Pre visit review using our clinic review tool, if applicable. No additional management support is needed unless otherwise documented below in the visit note.  Chief Complaint  Patient presents with  . Sinus Pain/Headache  . Nasal Congestion  . Cough    HPI: Laura Curtis 71 y.o.  sda   Ongoing sx after flu like illness  And now   Getting Progressive thick green" not" out of her nose copious amounts. Was using saline steam treatments and warm compresses. However she is developing pain now in her left cheek without fever. Cough is somewhat better. Beginning of the week she had a vomiting diarrhea illness that cause canceled a trip to [place]. The GI illness is better and she does have some anorexia. ROS: See pertinent positives and negatives per HPI. No fever no vomiting now  rash  Past Medical History:  Diagnosis Date  . Biceps tendinopathy 01/14/2012  . 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 Hearing aid worn   . Hepatitis A   . Hip pain, right 12/31/2011   A mild amount of arthritis is probable with the limitation of rotation but the key finding today was weakness in abduction   . 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  [name] Hx of varicella   . HX: benign breast biopsy   . Left rotator cuff tear 01/14/2012   Supraspinatus tear noted on ultrasound. Provided instructions regarding nitroglycerin patches 1/4 patch daily to shoulder.   To continue circular motion exercises and range of motion. No over the head exercises.  No weight bearing on that side. She can continue to play tennis as tolerated.  FU in 2 weeks if no improvement, 4 weeks if improved.      . Shoulder pain, left 12/31/2011   History of rotator cuff tear on the right and now has nontraumatic left shoulder pain   . Syncope    June, 2013  . Uterine prolapse     Family History  Problem Relation Age of Onset  . Stroke Father     age 54  . Other Mother     old age died 60   . Deep vein thrombosis Mother      Social History   Social History  . Marital status: Married    Spouse name: N/A  . Number of children: N/A  . Years of education: N/A   Social History Main Topics  . Smoking status: Never Smoker  . Smokeless tobacco: Never Used  . Alcohol use 1.0 - 1.5 oz/week    2 - 3 drink(s) per week     Comment: occ  . Drug use: No  . Sexual activity: No     Comment: BTL   Other Topics Concern  . 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 Sees dentist on a regular basis.    etoh 3 x per week                 Outpatient Medications Prior to Visit  Medication Sig Dispense Refill  . estradiol (ESTRACE) 0.1 MG/GM vaginal cream 1 applicator intravaginally  as directed 127.5 g 4   No facility-administered medications prior  to visit.      EXAM:  BP 110/74 (BP Location: Right Arm, Patient Position: Sitting, Cuff Size: Normal)   Temp 97.6 F (36.4 C) (Oral)   Ht 5' 4.5" (1.638 m)   Wt 140 lb (63.5 kg)   LMP 10/28/1995   BMI 23.66 kg/m   Body mass index is 23.66 kg/m. WDWN in NAD  quiet respirations; mildly congested  somewhat hoarse. Non toxic . HEENT: Normocephalic ;atraumatic , Eyes;  PERRL, EOMs  Full, lids and conjunctiva clear,,Ears: no deformities, canals nl, TM landmarks normal, Nose: no deformity or discharge but congested;faceleft maxilla is  tender Mouth : OP clear without lesion or edema . Neck: Supple without adenopathy or masses or bruits Chest:  Clear to A without wheezes rales or rhonchi CV:  S1-S2 no gallops or murmurs peripheral perfusion is normal Skin :nl perfusion and no acute rashes   ASSESSMENT AND PLAN:  Discussed the following assessment and plan:  Acute non-recurrent maxillary sinusitis - complication of flu like illness   Risk benefit of medication discussed.   Continue local measures  And add antibiotic    Expectant management.  -Patient advised to return or notify health care team  if symptoms worsen ,persist or new concerns arise.  Patient Instructions  I agree you have sinusitis that is probably a complication of the flu illness. Continue saline steam can try oral decongestants in the day to see if it relieves the pressure. Can add the antibiotic and hopefully will get better quicker. Expect improvement in the next 3-5 days Occasionally we have to change antibiotics.    Sinusitis, Adult Sinusitis is soreness and inflammation of your sinuses. Sinuses are hollow spaces in the bones around your face. Your sinuses are located:  Around your eyes.  In the middle of your forehead.  Behind your nose.  In your cheekbones. Your sinuses and nasal passages are lined with a stringy fluid (mucus). Mucus normally drains out of your sinuses. When your nasal tissues become inflamed or swollen, the mucus can become trapped or blocked so air cannot flow through your sinuses. This allows bacteria, viruses, and funguses to grow, which leads to infection. Sinusitis can develop quickly and last for 7?10 days (acute) or for more than 12 weeks (chronic). Sinusitis often develops after a cold. What are the causes? This condition is caused by anything that creates swelling in the sinuses or stops mucus from draining, including:  Allergies.  Asthma.  Bacterial or viral infection.  Abnormally shaped bones between the nasal passages.  Nasal growths that contain mucus (nasal polyps).  Narrow sinus openings.  Pollutants, such as chemicals or irritants in the air.  A foreign object stuck in the nose.  A fungal infection. This is rare. What increases the risk? The following factors may make you more likely to develop this condition:  Having allergies or asthma.  Having had a recent cold or respiratory tract infection.  Having structural deformities or  blockages in your nose or sinuses.  Having a weak immune system.  Doing a lot of swimming or diving.  Overusing nasal sprays.  Smoking. What are the signs or symptoms? The main symptoms of this condition are pain and a feeling of pressure around the affected sinuses. Other symptoms include:  Upper toothache.  Earache.  Headache.  Bad breath.  Decreased sense of smell and taste.  A cough that may get worse at night.  Fatigue.  Fever.  Thick drainage from your nose. The drainage is often green  and it may contain pus (purulent).  Stuffy nose or congestion.  Postnasal drip. This is when extra mucus collects in the throat or back of the nose.  Swelling and warmth over the affected sinuses.  Sore throat.  Sensitivity to light. How is this diagnosed? This condition is diagnosed based on symptoms, a medical history, and a physical exam. To find out if your condition is acute or chronic, your health care provider may:  Look in your nose for signs of nasal polyps.  Tap over the affected sinus to check for signs of infection.  View the inside of your sinuses using an imaging device that has a light attached (endoscope). If your health care provider suspects that you have chronic sinusitis, you may also:  Be tested for allergies.  Have a sample of mucus taken from your nose (nasal culture) and checked for bacteria.  Have a mucus sample examined to see if your sinusitis is related to an allergy. If your sinusitis does not respond to treatment and it lasts longer than 8 weeks, you may have an MRI or CT scan to check your sinuses. These scans also help to determine how severe your infection is. In rare cases, a bone biopsy may be done to rule out more serious types of fungal sinus disease. How is this treated? Treatment for sinusitis depends on the cause and whether your condition is chronic or acute. If a virus is causing your sinusitis, your symptoms will go away on their  own within 10 days. You may be given medicines to relieve your symptoms, including:  Topical nasal decongestants. They shrink swollen nasal passages and let mucus drain from your sinuses.  Antihistamines. These drugs block inflammation that is triggered by allergies. This can help to ease swelling in your nose and sinuses.  Topical nasal corticosteroids. These are nasal sprays that ease inflammation and swelling in your nose and sinuses.  Nasal saline washes. These rinses can help to get rid of thick mucus in your nose. If your condition is caused by bacteria, you will be given an antibiotic medicine. If your condition is caused by a fungus, you will be given an antifungal medicine. Surgery may be needed to correct underlying conditions, such as narrow nasal passages. Surgery may also be needed to remove polyps. Follow these instructions at home: Medicines  Take, use, or apply over-the-counter and prescription medicines only as told by your health care provider. These may include nasal sprays.  If you were prescribed an antibiotic medicine, take it as told by your health care provider. Do not stop taking the antibiotic even if you start to feel better. Hydrate and Humidify  Drink enough water to keep your urine clear or pale yellow. Staying hydrated will help to thin your mucus.  Use a cool mist humidifier to keep the humidity level in your home above 50%.  Inhale steam for 10-15 minutes, 3-4 times a day or as told by your health care provider. You can do this in the bathroom while a hot shower is running.  Limit your exposure to cool or dry air. Rest  Rest as much as possible.  Sleep with your head raised (elevated).  Make sure to get enough sleep each night. General instructions  Apply a warm, moist washcloth to your face 3-4 times a day or as told by your health care provider. This will help with discomfort.  Wash your hands often with soap and water to reduce your exposure to  viruses and other  germs. If soap and water are not available, use hand sanitizer.  Do not smoke. Avoid being around people who are smoking (secondhand smoke).  Keep all follow-up visits as told by your health care provider. This is important. Contact a health care provider if:  You have a fever.  Your symptoms get worse.  Your symptoms do not improve within 10 days. Get help right away if:  You have a severe headache.  You have persistent vomiting.  You have pain or swelling around your face or eyes.  You have vision problems.  You develop confusion.  Your neck is stiff.  You have trouble breathing. This information is not intended to replace advice given to you by your health care provider. Make sure you discuss any questions you have with your health care provider. Document Released: 10/13/2005 Document Revised: 06/08/2016 Document Reviewed: 08/08/2015 Elsevier Interactive Patient Education  2017 [place] [employer]. Suhailah Kwan M.D.

## 2016-11-20 NOTE — Patient Instructions (Signed)
I agree you have sinusitis that is probably a complication of the flu illness. Continue saline steam can try oral decongestants in the day to see if it relieves the pressure. Can add the antibiotic and hopefully will get better quicker. Expect improvement in the next 3-5 days Occasionally we have to change antibiotics.    Sinusitis, Adult Sinusitis is soreness and inflammation of your sinuses. Sinuses are hollow spaces in the bones around your face. Your sinuses are located:  Around your eyes.  In the middle of your forehead.  Behind your nose.  In your cheekbones. Your sinuses and nasal passages are lined with a stringy fluid (mucus). Mucus normally drains out of your sinuses. When your nasal tissues become inflamed or swollen, the mucus can become trapped or blocked so air cannot flow through your sinuses. This allows bacteria, viruses, and funguses to grow, which leads to infection. Sinusitis can develop quickly and last for 7?10 days (acute) or for more than 12 weeks (chronic). Sinusitis often develops after a cold. What are the causes? This condition is caused by anything that creates swelling in the sinuses or stops mucus from draining, including:  Allergies.  Asthma.  Bacterial or viral infection.  Abnormally shaped bones between the nasal passages.  Nasal growths that contain mucus (nasal polyps).  Narrow sinus openings.  Pollutants, such as chemicals or irritants in the air.  A foreign object stuck in the nose.  A fungal infection. This is rare. What increases the risk? The following factors may make you more likely to develop this condition:  Having allergies or asthma.  Having had a recent cold or respiratory tract infection.  Having structural deformities or blockages in your nose or sinuses.  Having a weak immune system.  Doing a lot of swimming or diving.  Overusing nasal sprays.  Smoking. What are the signs or symptoms? The main symptoms of this  condition are pain and a feeling of pressure around the affected sinuses. Other symptoms include:  Upper toothache.  Earache.  Headache.  Bad breath.  Decreased sense of smell and taste.  A cough that may get worse at night.  Fatigue.  Fever.  Thick drainage from your nose. The drainage is often green and it may contain pus (purulent).  Stuffy nose or congestion.  Postnasal drip. This is when extra mucus collects in the throat or back of the nose.  Swelling and warmth over the affected sinuses.  Sore throat.  Sensitivity to light. How is this diagnosed? This condition is diagnosed based on symptoms, a medical history, and a physical exam. To find out if your condition is acute or chronic, your health care provider may:  Look in your nose for signs of nasal polyps.  Tap over the affected sinus to check for signs of infection.  View the inside of your sinuses using an imaging device that has a light attached (endoscope). If your health care provider suspects that you have chronic sinusitis, you may also:  Be tested for allergies.  Have a sample of mucus taken from your nose (nasal culture) and checked for bacteria.  Have a mucus sample examined to see if your sinusitis is related to an allergy. If your sinusitis does not respond to treatment and it lasts longer than 8 weeks, you may have an MRI or CT scan to check your sinuses. These scans also help to determine how severe your infection is. In rare cases, a bone biopsy may be done to rule out more serious  types of fungal sinus disease. How is this treated? Treatment for sinusitis depends on the cause and whether your condition is chronic or acute. If a virus is causing your sinusitis, your symptoms will go away on their own within 10 days. You may be given medicines to relieve your symptoms, including:  Topical nasal decongestants. They shrink swollen nasal passages and let mucus drain from your  sinuses.  Antihistamines. These drugs block inflammation that is triggered by allergies. This can help to ease swelling in your nose and sinuses.  Topical nasal corticosteroids. These are nasal sprays that ease inflammation and swelling in your nose and sinuses.  Nasal saline washes. These rinses can help to get rid of thick mucus in your nose. If your condition is caused by bacteria, you will be given an antibiotic medicine. If your condition is caused by a fungus, you will be given an antifungal medicine. Surgery may be needed to correct underlying conditions, such as narrow nasal passages. Surgery may also be needed to remove polyps. Follow these instructions at home: Medicines  Take, use, or apply over-the-counter and prescription medicines only as told by your health care provider. These may include nasal sprays.  If you were prescribed an antibiotic medicine, take it as told by your health care provider. Do not stop taking the antibiotic even if you start to feel better. Hydrate and Humidify  Drink enough water to keep your urine clear or pale yellow. Staying hydrated will help to thin your mucus.  Use a cool mist humidifier to keep the humidity level in your home above 50%.  Inhale steam for 10-15 minutes, 3-4 times a day or as told by your health care provider. You can do this in the bathroom while a hot shower is running.  Limit your exposure to cool or dry air. Rest  Rest as much as possible.  Sleep with your head raised (elevated).  Make sure to get enough sleep each night. General instructions  Apply a warm, moist washcloth to your face 3-4 times a day or as told by your health care provider. This will help with discomfort.  Wash your hands often with soap and water to reduce your exposure to viruses and other germs. If soap and water are not available, use hand sanitizer.  Do not smoke. Avoid being around people who are smoking (secondhand smoke).  Keep all  follow-up visits as told by your health care provider. This is important. Contact a health care provider if:  You have a fever.  Your symptoms get worse.  Your symptoms do not improve within 10 days. Get help right away if:  You have a severe headache.  You have persistent vomiting.  You have pain or swelling around your face or eyes.  You have vision problems.  You develop confusion.  Your neck is stiff.  You have trouble breathing. This information is not intended to replace advice given to you by your health care provider. Make sure you discuss any questions you have with your health care provider. Document Released: 10/13/2005 Document Revised: 06/08/2016 Document Reviewed: 08/08/2015 Elsevier Interactive Patient Education  2017 [employer].

## 2016-12-01 ENCOUNTER — Encounter: Payer: Self-pay | Admitting: Internal Medicine

## 2016-12-01 ENCOUNTER — Ambulatory Visit (INDEPENDENT_AMBULATORY_CARE_PROVIDER_SITE_OTHER): Payer: Medicare Other | Admitting: Internal Medicine

## 2016-12-01 VITALS — BP 120/72 | Temp 98.3°F | Ht 64.5 in | Wt 138.0 lb

## 2016-12-01 DIAGNOSIS — J019 Acute sinusitis, unspecified: Secondary | ICD-10-CM | POA: Diagnosis not present

## 2016-12-01 MED ORDER — PREDNISONE 20 MG PO TABS
20.0000 mg | ORAL_TABLET | Freq: Two times a day (BID) | ORAL | 0 refills | Status: DC
Start: 2016-12-01 — End: 2016-12-16

## 2016-12-01 MED ORDER — CEFDINIR 300 MG PO CAPS: 300.0000 mg | ORAL_CAPSULE | Freq: Two times a day (BID) | ORAL | 0 refills | Status: DC

## 2016-12-01 NOTE — Patient Instructions (Signed)
We will be using a broader spectrum antibiotic in 3-5 days of prednisone to try to cure you relapsing sinusitis. Continue nasal saline. Expect significant improvement within the next 3-4 days. Let us know if you are not improving or relapsing again which I do not expect.

## 2016-12-01 NOTE — Progress Notes (Signed)
Pre visit review using our clinic review tool, if applicable. No additional management support is needed unless otherwise documented below in the visit note.  Chief Complaint  Patient presents with  . Sinus Pressure/Pain  . Productive Cough  . Nasal Congestion    HPI: Laura Curtis 71 y.o.   sda   worsesning  Sx  Had lfu like sx  In jan 16 and then  1 25  rx for sinusitis  Was a lot better at end of  antibtioc ( amox)  2 days off. Now and sx returning with sinus pressure  And ears popping r more than lef t   Stuck in head   Right more than left side  Still using irrigation   ROS: See pertinent positives and negatives per HPI. No cp sop  Past Medical History:  Diagnosis Date  . Biceps tendinopathy 01/14/2012  . 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 Hearing aid worn   . Hepatitis A   . Hip pain, right 12/31/2011   A mild amount of arthritis is probable with the limitation of rotation but the key finding today was weakness in abduction   . 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  [name] Hx of varicella   . HX: benign breast biopsy   . Left rotator cuff tear 01/14/2012   Supraspinatus tear noted on ultrasound. Provided instructions regarding nitroglycerin patches 1/4 patch daily to shoulder.   To continue circular motion exercises and range of motion. No over the head exercises.  No weight bearing on that side. She can continue to play tennis as tolerated.  FU in 2 weeks if no improvement, 4 weeks if improved.      . Shoulder pain, left 12/31/2011   History of rotator cuff tear on the right and now has nontraumatic left shoulder pain   . Syncope    June, 2013  . Uterine prolapse     Family History  Problem Relation Age of Onset  . Stroke Father     age 76  . Other Mother     old age died 22   . Deep vein thrombosis Mother     Social History   Social History  . Marital status: Married    Spouse name: N/A  . Number of children: N/A  . Years of education: N/A    Social History Main Topics  . Smoking status: Never Smoker  . Smokeless tobacco: Never Used  . Alcohol use 1.0 - 1.5 oz/week    2 - 3 drink(s) per week     Comment: occ  . Drug use: No  . Sexual activity: No     Comment: BTL   Other Topics Concern  . 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 Sees dentist on a regular basis.    etoh 3 x per week                 Outpatient Medications Prior to Visit  Medication Sig Dispense Refill  . estradiol (ESTRACE) 0.1 MG/GM vaginal cream 1 applicator intravaginally  as directed 127.5 g 4  . amoxicillin (AMOXIL) 500 MG capsule Take 1 capsule (500  mg total) by mouth 3 (three) times daily. 21 capsule 0   No facility-administered medications prior to visit.      EXAM:  BP 120/72 (BP Location: Right Arm, Patient Position: Sitting, Cuff Size: Normal)   Temp 98.3 F (36.8 C) (Oral)   Ht 5' 4.5" (1.638 m)   Wt 138 lb (62.6 kg)   LMP 10/28/1995   SpO2 96%   BMI 23.32 kg/m   Body mass index is 23.32 kg/m. WDWN in NAD  quiet respirations; miod  congested  somewhat hoarse. Non toxic . HEENT: Normocephalic ;atraumatic , Eyes;  PERRL, EOMs  Full, lids and conjunctiva clear,,Ears: no deformities, canals nl, TM landmarks normal,right  ? Clear fluid  Hearing aids  Nose: no deformity or discharge but congested;face minimally tender Mouth : OP clear without lesion or edema . Neck: Supple without adenopathy or masses or bruits Chest:  Clear to A without wheezes rales or rhonchi CV:  S1-S2 no gallops or murmurs peripheral perfusion is normal Skin :nl perfusion and no acute rashes  ASSESSMENT AND PLAN:  Discussed the following assessment and plan:  Acute sinusitis treated with antibiotics in the past 60 days relapsed see text  Relapsing sinusitis treated  initially with amoxicillin. Had some GI side effects risk with Augmentin we'll treat with Omnicef broader spectrum cephalosporin to minimize side effects discussed risk-benefit of prednisone given prednisone treatment can use 3-5 days in addition. Contact us if relapsing again fever etc. -Patient advised to return or notify health care team  if symptoms worsen ,persist or new concerns arise.  Patient Instructions  We will be using a broader spectrum antibiotic in 3-5 days of prednisone to try to cure you relapsing sinusitis. Continue nasal saline. Expect significant improvement within the next 3-4 days. Let us know if you are not improving or relapsing again which I do not expect.     Standley Brooking. Gabryelle Whitmoyer M.D.

## 2016-12-15 ENCOUNTER — Encounter (HOSPITAL_COMMUNITY): Payer: Self-pay | Admitting: Emergency Medicine

## 2016-12-15 ENCOUNTER — Ambulatory Visit (HOSPITAL_COMMUNITY)
Admission: EM | Admit: 2016-12-15 | Discharge: 2016-12-15 | Disposition: A | Discharge status: Home / Self Care | Payer: Medicare Other | Attending: Internal Medicine | Admitting: Internal Medicine

## 2016-12-15 ENCOUNTER — Telehealth: Payer: Self-pay | Admitting: Obstetrics & Gynecology

## 2016-12-15 ENCOUNTER — Telehealth: Payer: Self-pay

## 2016-12-15 DIAGNOSIS — R05 Cough: Secondary | ICD-10-CM | POA: Insufficient documentation

## 2016-12-15 DIAGNOSIS — Z85828 Personal history of other malignant neoplasm of skin: Secondary | ICD-10-CM | POA: Insufficient documentation

## 2016-12-15 DIAGNOSIS — R350 Frequency of micturition: Secondary | ICD-10-CM | POA: Diagnosis not present

## 2016-12-15 DIAGNOSIS — R7611 Nonspecific reaction to tuberculin skin test without active tuberculosis: Secondary | ICD-10-CM | POA: Insufficient documentation

## 2016-12-15 DIAGNOSIS — R102 Pelvic and perineal pain: Secondary | ICD-10-CM | POA: Diagnosis not present

## 2016-12-15 DIAGNOSIS — R109 Unspecified abdominal pain: Secondary | ICD-10-CM | POA: Diagnosis present

## 2016-12-15 DIAGNOSIS — B9789 Other viral agents as the cause of diseases classified elsewhere: Secondary | ICD-10-CM

## 2016-12-15 DIAGNOSIS — J069 Acute upper respiratory infection, unspecified: Secondary | ICD-10-CM | POA: Insufficient documentation

## 2016-12-15 LAB — POCT URINALYSIS DIP (DEVICE)
BILIRUBIN URINE: NEGATIVE
Glucose, UA: NEGATIVE mg/dL
Hgb urine dipstick: NEGATIVE
Ketones, ur: NEGATIVE mg/dL
LEUKOCYTES UA: NEGATIVE
NITRITE: NEGATIVE
Protein, ur: NEGATIVE mg/dL
Specific Gravity, Urine: 1.01 (ref 1.005–1.030)
Urobilinogen, UA: 0.2 mg/dL (ref 0.0–1.0)
pH: 5.5 (ref 5.0–8.0)

## 2016-12-15 MED ORDER — BENZONATATE 100 MG PO CAPS: 100.0000 mg | ORAL_CAPSULE | Freq: Three times a day (TID) | ORAL | 0 refills | Status: DC

## 2016-12-15 MED ORDER — PHENAZOPYRIDINE HCL 200 MG PO TABS: 200.0000 mg | ORAL_TABLET | Freq: Three times a day (TID) | ORAL | 0 refills | Status: DC | PRN

## 2016-12-15 NOTE — ED Provider Notes (Signed)
CSN: [id number]     Arrival date & time 12/15/16  1232 History   First MD Initiated Contact with Patient 12/15/16 1419     Chief Complaint  Patient presents with  . Abdominal Pain  . Urinary Frequency   (Consider location/radiation/quality/duration/timing/severity/associated sxs/prior Treatment) 71 year old female presents with 24-hour history of lower abdominal pain/pelvic pain. States she removed her pessary last night due to pain, states she feels pressure within her vagina. Also had increased urinary frequency. She denies urinary urgency, denies dysuria, denies flank pain, does state whenever she has to defecate, she gets some pain relief but rapidly feels as if she has to go a second time afterwards.  She additionally has cough and URI symptoms. Ongoing for more than one month. She has been seen by her primary care physician twice, prescribed 2 different antibiotics and most recently around a prednisone with minimal relief.   The history is provided by the patient.  Abdominal Pain  Urinary Frequency  Associated symptoms include abdominal pain.    Past Medical History:  Diagnosis Date  . Biceps tendinopathy 01/14/2012  . 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 Hearing aid worn   . Hepatitis A   . Hip pain, right 12/31/2011   A mild amount of arthritis is probable with the limitation of rotation but the key finding today was weakness in abduction   . 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  [name] Hx of varicella   . HX: benign breast biopsy   . Left rotator cuff tear 01/14/2012   Supraspinatus tear noted on ultrasound. Provided instructions regarding nitroglycerin patches 1/4 patch daily to shoulder.   To continue circular motion exercises and range of motion. No over the head exercises.  No weight bearing on that side. She can continue to play tennis as tolerated.  FU in 2 weeks if no improvement, 4 weeks if improved.      . Shoulder pain, left 12/31/2011   History of rotator  cuff tear on the right and now has nontraumatic left shoulder pain   . Syncope    June, 2013  . Uterine prolapse    Past Surgical History:  Procedure Laterality Date  . APPENDECTOMY  1968  . BREAST BIOPSY  1998  . MOHS SURGERY     for squamous cell ca left nose  . OVARIAN CYST SURGERY  1968  . ROTATOR CUFF REPAIR  2001  . TONSILLECTOMY AND ADENOIDECTOMY    . TUBAL LIGATION     Family History  Problem Relation Age of Onset  . Stroke Father     age 33  . Other Mother     old age died 33   . Deep vein thrombosis Mother    Social History  Substance Use Topics  . Smoking status: Never Smoker  . Smokeless tobacco: Never Used  . Alcohol use 1.0 - 1.5 oz/week    2 - 3 drink(s) per week     Comment: occ   OB History    Gravida Para Term Preterm AB Living   5 3     2 3    SAB TAB Ectopic Multiple Live Births     2     3     Review of Systems  Reason unable to perform ROS: as covered in HPI.  Gastrointestinal: Positive for abdominal pain.  Genitourinary: Positive for frequency.  All other systems reviewed and are negative.   Allergies  Patient  has no known allergies.  Home Medications   Prior to Admission medications   Medication Sig Start Date End Date Taking? Authorizing Provider  cefdinir (OMNICEF) 300 MG capsule Take 1 capsule (300 mg total) by mouth 2 (two) times daily. 12/01/16  Yes Burnis Medin, MD  predniSONE (DELTASONE) 20 MG tablet Take 1 tablet (20 mg total) by mouth 2 (two) times daily with a meal. 12/01/16  Yes Burnis Medin, MD  benzonatate (TESSALON) 100 MG capsule Take 1 capsule (100 mg total) by mouth every 8 (eight) hours. 12/15/16   Barnet Glasgow, NP  phenazopyridine (PYRIDIUM) 200 MG tablet Take 1 tablet (200 mg total) by mouth 3 (three) times daily as needed for pain. 12/15/16   Barnet Glasgow, NP   Meds Ordered and Administered this Visit  Medications - No data to display  BP (!) 103/45 (BP Location: Right Arm)   Pulse 80   Temp 98.9 F (37.2  C) (Oral)   Resp 18   LMP 10/28/1995   SpO2 100%  No data found.   Physical Exam  Constitutional: She is oriented to person, place, and time. She appears well-developed and well-nourished. She does not have a sickly appearance. She does not appear ill. No distress.  HENT:  Head: Normocephalic and atraumatic.  Right Ear: Tympanic membrane and external ear normal.  Left Ear: Tympanic membrane and external ear normal.  Nose: Nose normal. Right sinus exhibits no maxillary sinus tenderness and no frontal sinus tenderness. Left sinus exhibits no maxillary sinus tenderness and no frontal sinus tenderness.  Mouth/Throat: Uvula is midline and oropharynx is clear and moist. No oropharyngeal exudate.  Eyes: Pupils are equal, round, and reactive to light.  Neck: Normal range of motion. Neck supple. No JVD present.  Cardiovascular: Normal rate and regular rhythm.   Pulmonary/Chest: Effort normal and breath sounds normal. No respiratory distress. She has no wheezes.  Abdominal: Soft. Bowel sounds are normal. She exhibits no distension. There is no tenderness. There is no guarding.  Genitourinary:  Genitourinary Comments: Deferred  Lymphadenopathy:    She has no cervical adenopathy.  Neurological: She is alert and oriented to person, place, and time.  Skin: Skin is warm and dry. Capillary refill takes less than 2 seconds. She is not diaphoretic.  Psychiatric: She has a normal mood and affect.  Nursing note and vitals reviewed.   Urgent Care Course     Procedures (including critical care time)  Labs Review Labs Reviewed  URINE CULTURE  POCT URINALYSIS DIP (DEVICE)    Imaging Review No results found.   Visual Acuity Review  Right Eye Distance:   Left Eye Distance:   Bilateral Distance:    Right Eye Near:   Left Eye Near:    Bilateral Near:         MDM   1. Pelvic pain   2. Viral URI with cough   UA sent to culture, patient prescribed Tessalon for cough, and Pyridium for  dysuria. Advised to follow-up with either gynecologist, or urologist with regard to GU symptoms.    Barnet Glasgow, NP 12/15/16 1544

## 2016-12-15 NOTE — Telephone Encounter (Signed)
Patient currently has the flu.  Says she is at [facility] urgent care now being seen because she has pelvic pain.  She is having urinary frequency but says there is no infection per the doctor at the urgent care.  The urgent care is giving her something for the pain but she wants to come see [place].

## 2016-12-15 NOTE — Telephone Encounter (Signed)
Spoke with patient. Patient states she has been sick with the flu for 5 weeks, now has residual cough. Patient states she never tested positive for flu. Patient reports lower abdominal pain started 2/18 and was intermittent throughout day, patient applied hot water bottle to area and slept through night. Patient states she woke up with the pain and intermittent nausea today and called pcp, Dr. Regis Bill. Pcp recommended ER visit d/t pain for evaluation. Patient states she was seen in ER today, reports urine negative, no fever. Patient has c/o urinary frequency and has removed her pessary. Patient states she was sent home with pyridium for symptom relief. Patient request OV with Dr. Sabra Heck. Reviewed schedule for 2/20 with Gay Filler, ok to schedule for 12:30pm. Advised patient can schedule for 12:30pm, advised Dr. Sabra Heck is in surgery, will call if she finishes early or is going to be delayed. Advised patient mask would be provided at front desk, patient is agreeable. Advised patient should symptoms worsen overnight, return to ER for evaluation. Patient verbalizes understanding and is agreeable.   Routing to provider for final review. Patient is agreeable to disposition. Will close encounter.

## 2016-12-15 NOTE — Telephone Encounter (Signed)
Patient called again wanting to know when someone will be calling her.

## 2016-12-15 NOTE — Discharge Instructions (Signed)
You do not show signs of bacterial UTI. I have prescribed a medicine called pyridium for your discomfort. Take three times a day as needed. It will stain your urine orange. I recommend you follow up with your gynecologist or urologist, which ever specialist ordered your pessary  and schedule a follow up exam as I am concerned for possible prolapse.    For cough, I have prescribed a medication called Tessalon. Take 1 tablet every 8 hours as needed for your cough. I do not think antibiotics are warranted for your URI symptoms, as you have been treated twice with 2 different antibiotics as well as prednisone. If your symptoms persist recommended follow-up with her primary care provider or return to clinic as needed.

## 2016-12-15 NOTE — ED Triage Notes (Signed)
The patient presented to the [facility] with a complaint of lower abdominal pain and urinary frequency x 2 days.

## 2016-12-15 NOTE — Telephone Encounter (Signed)
Pt called c/o severe abdominal pain since yesterday. She reports that the pain comes in waves and is severe enough to maker her double over. She has had some relief with a bowel movement. She also reports some nausea this morning. She denies fever or vomiting. Advised pt to seek care at [facility] immediately, as ED may expose her to other illnesses. Pt agrees and is going to [facility] to be seen. Nothing further needed at this time.

## 2016-12-16 ENCOUNTER — Encounter: Payer: Self-pay | Admitting: Obstetrics & Gynecology

## 2016-12-16 ENCOUNTER — Ambulatory Visit (INDEPENDENT_AMBULATORY_CARE_PROVIDER_SITE_OTHER): Payer: Medicare Other | Admitting: Obstetrics & Gynecology

## 2016-12-16 VITALS — BP 100/58 | HR 68 | Resp 14 | Ht 64.5 in | Wt 141.8 lb

## 2016-12-16 DIAGNOSIS — R102 Pelvic and perineal pain: Secondary | ICD-10-CM | POA: Diagnosis not present

## 2016-12-16 DIAGNOSIS — R05 Cough: Secondary | ICD-10-CM

## 2016-12-16 DIAGNOSIS — M6289 Other specified disorders of muscle: Secondary | ICD-10-CM

## 2016-12-16 DIAGNOSIS — R059 Cough, unspecified: Secondary | ICD-10-CM

## 2016-12-16 LAB — URINE CULTURE: CULTURE: NO GROWTH

## 2016-12-16 MED ORDER — CYCLOBENZAPRINE HCL 10 MG PO TABS: 10.0000 mg | ORAL_TABLET | Freq: Every day | ORAL | 0 refills | Status: DC

## 2016-12-16 MED ORDER — ESTRADIOL 0.1 MG/GM VA CREA: TOPICAL_CREAM | VAGINAL | 1 refills | Status: DC

## 2016-12-16 MED ORDER — IBUPROFEN 800 MG PO TABS: 800.0000 mg | ORAL_TABLET | Freq: Three times a day (TID) | ORAL | 0 refills | Status: DC | PRN

## 2016-12-17 LAB — URINE CULTURE: Organism ID, Bacteria: NO GROWTH

## 2016-12-18 ENCOUNTER — Ambulatory Visit (INDEPENDENT_AMBULATORY_CARE_PROVIDER_SITE_OTHER): Payer: Medicare Other | Admitting: Internal Medicine

## 2016-12-18 ENCOUNTER — Encounter: Payer: Self-pay | Admitting: Internal Medicine

## 2016-12-18 ENCOUNTER — Ambulatory Visit (INDEPENDENT_AMBULATORY_CARE_PROVIDER_SITE_OTHER)
Admission: RE | Admit: 2016-12-18 | Discharge: 2016-12-18 | Disposition: A | Discharge status: Home / Self Care | Payer: Medicare Other | Source: Ambulatory Visit | Attending: Internal Medicine | Admitting: Internal Medicine

## 2016-12-18 VITALS — BP 110/70 | HR 72 | Temp 97.8°F | Ht 64.5 in | Wt 139.9 lb

## 2016-12-18 DIAGNOSIS — R079 Chest pain, unspecified: Secondary | ICD-10-CM | POA: Diagnosis not present

## 2016-12-18 DIAGNOSIS — R05 Cough: Secondary | ICD-10-CM

## 2016-12-18 DIAGNOSIS — R61 Generalized hyperhidrosis: Secondary | ICD-10-CM | POA: Diagnosis not present

## 2016-12-18 DIAGNOSIS — R059 Cough, unspecified: Secondary | ICD-10-CM

## 2016-12-18 DIAGNOSIS — R053 Chronic cough: Secondary | ICD-10-CM

## 2016-12-18 MED ORDER — LEVOFLOXACIN 750 MG PO TABS
750.0000 mg | ORAL_TABLET | Freq: Every day | ORAL | 0 refills | Status: AC
Start: 2016-12-18 — End: 2016-12-23

## 2016-12-18 NOTE — Progress Notes (Signed)
Chief Complaint  Patient presents with  . Cough    started a month ago   . Shortness of Breath  . Night Sweats    HPI: Laura Curtis 71 y.o.  sda   and mjultiple visit related to res sx rx with a ntibiotic and steroids  seh was in ed for abd pain and felt to have pelvioc floor dysfunction  From coughin   Sinuses  Not that bad   anitbiotic helped some but then after 2 days off got wors and now coughing is the main  Sx  Her cough is getting worse now she has left-sided chest pain pleuritic question pulled muscle. Mild shortness of breath in the last 2 nights has had night sweats. Is new for her. No hemoptysis she was at the ED for severe groin pain which was felt to be a strain from coughing. No hernia was present. Her sinuses are some better but she still congested. Review of her records she was treated for sinusitis with Omnicef and amoxicillin and prednisone 3-4 weeks ago. ROS: See pertinent positives and negatives per HPI.  Past Medical History:  Diagnosis Date  . Biceps tendinopathy 01/14/2012  . 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 Hearing aid worn   . Hepatitis A   . Hip pain, right 12/31/2011   A mild amount of arthritis is probable with the limitation of rotation but the key finding today was weakness in abduction   . 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  [name] Hx of varicella   . HX: benign breast biopsy   . Left rotator cuff tear 01/14/2012   Supraspinatus tear noted on ultrasound. Provided instructions regarding nitroglycerin patches 1/4 patch daily to shoulder.   To continue circular motion exercises and range of motion. No over the head exercises.  No weight bearing on that side. She can continue to play tennis as tolerated.  FU in 2 weeks if no improvement, 4 weeks if improved.      . Shoulder pain, left 12/31/2011   History of rotator cuff tear on the right and now has nontraumatic left shoulder pain   . Syncope    June, 2013  . Uterine prolapse      Family History  Problem Relation Age of Onset  . Stroke Father     age 31  . Other Mother     old age died 36   . Deep vein thrombosis Mother     Social History   Social History  . Marital status: Married    Spouse name: N/A  . Number of children: N/A  . Years of education: N/A   Social History Main Topics  . Smoking status: Never Smoker  . Smokeless tobacco: Never Used  . Alcohol use 1.0 - 1.5 oz/week    2 - 3 drink(s) per week     Comment: occ  . Drug use: No  . Sexual activity: No     Comment: BTL   Other Topics Concern  . 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 Sees dentist on a regular basis.    etoh 3 x per week  Outpatient Medications Prior to Visit  Medication Sig Dispense Refill  . benzonatate (TESSALON) 100 MG capsule Take 1 capsule (100 mg total) by mouth every 8 (eight) hours. 21 capsule 0  . cyclobenzaprine (FLEXERIL) 10 MG tablet Take 1 tablet (10 mg total) by mouth at bedtime. 30 tablet 0  . estradiol (ESTRACE) 0.1 MG/GM vaginal cream 1 gram vaginally twice weekly 42.5 g 1  . ibuprofen (ADVIL,MOTRIN) 800 MG tablet Take 1 tablet (800 mg total) by mouth every 8 (eight) hours as needed. 30 tablet 0  . Phenazopyridine HCl (AZO-STANDARD PO) Take by mouth.     No facility-administered medications prior to visit.      EXAM:  BP 110/70 (BP Location: Right Arm, Patient Position: Sitting, Cuff Size: Normal)   Pulse 72   Temp 97.8 F (36.6 C) (Oral)   Ht 5' 4.5" (1.638 m)   Wt 139 lb 14.4 oz (63.5 kg)   LMP 10/28/1995   SpO2 95%   BMI 23.64 kg/m   Body mass index is 23.64 kg/m.  GENERAL: vitals reviewed and listed above, alert, oriented, appears well hydrated and in no acute distressShe has mild upper respiratory congestion no  significant face pain. Nontoxic appearing but looks mildly  Ill  HEENT: atraumatic, conjunctiva  clear, no obvious abnormalities on inspection of external nose and ears OP : no lesion edema or exudate  NECK: no obvious masses on inspection palpation  LUNGS she has a few crackles at her left base with some musical bronchial sounds both bases bilaterally. Otherwise breath sounds equal. CV: HRRR, no clubbing cyanosis or  peripheral edema nl cap refill  MS: moves all extremities without noticeable focal  abnormality PSYCH: pleasant and cooperative, no obvious depression or anxiety  ASSESSMENT AND PLAN:  Discussed the following assessment and plan:  Cough - Plan: DG Chest 2 View  Cough, persistent - Plan: DG Chest 2 View  Left sided chest pain - Plan: DG Chest 2 View  Night sweat - Plan: DG Chest 2 View Abnormal lung exam today could be secondary bronchospasm but localized to left lower base could be early pneumonia versus other. These symptoms are new or in relapsing from her original illness area check chest x-ray consider them. Treatment even if x-ray negative. -Patient advised to return or notify health care team  if symptoms worsen ,persist or new concerns arise.  Patient Instructions  Concerns that you are getting early pneumonia. You have a few sounds on the left side it sounds like wheezing. Get chest x-ray today then make plan.      Standley Brooking. Luisdavid Hamblin M.D.

## 2016-12-18 NOTE — Patient Instructions (Signed)
Concerns that you are getting early pneumonia. You have a few sounds on the left side it sounds like wheezing. Get chest x-ray today then make plan.

## 2016-12-19 NOTE — Progress Notes (Signed)
GYNECOLOGY  VISIT   HPI: 71 y.o. [name] Married Caucasian female with complaint of five weeks of coughing due to URI.  She started having pelvic pain about a day ago that was lower and more pronounced over to the right.  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  She reports no vaginal bleeding or discharge but does feel some increased urinary urgency.  Denies dysuria, hematuria or flank pain.  Pain is worse with coughing but there is a chronic ache to it as well.    Pt called Dr. Velora Mediate office yesterday and was advised to go to the ER.  Was treated with Tessalon and Pyridium.  Urine culture pending.  No other testing performed.  Pt denies fever, nausea/vomiting, diarrhea/constipation, back pain.  GYNECOLOGIC HISTORY: Patient's last menstrual period was 10/28/1995. Contraception: PMP state Menopausal hormone therapy: none  Patient Active Problem List   Diagnosis Date Noted  . Degenerative tear of medial meniscus of right knee 04/01/2016  . Hearing aid worn   . Greater trochanteric bursitis of right hip 04/15/2013  . Infected eye lid 01/11/2013  . Internal hordeolum 01/11/2013  . Hx of syncope 07/21/2012  . Counseling on health promotion and disease prevention 07/21/2012  . Immunization counseling 07/21/2012  . Hx of nonmelanoma skin cancer 07/21/2012  . History of positive PPD   . HX: benign breast biopsy   . Syncope   . Left rotator cuff tear 01/14/2012  . Biceps tendinopathy 01/14/2012  . Hip pain, right 12/31/2011  . Shoulder pain, left 12/31/2011    Past Medical History:  Diagnosis Date  . Biceps tendinopathy 01/14/2012  . 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 Hearing aid worn   . Hepatitis A   . Hip pain, right 12/31/2011   A mild amount of arthritis is probable with the limitation of rotation but the key finding today was weakness in abduction    . 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  [name] Hx of varicella   . HX: benign breast biopsy   . Left rotator cuff tear 01/14/2012   Supraspinatus tear noted on ultrasound. Provided instructions regarding nitroglycerin patches 1/4 patch daily to shoulder.   To continue circular motion exercises and range of motion. No over the head exercises.  No weight bearing on that side. She can continue to play tennis as tolerated.  FU in 2 weeks if no improvement, 4 weeks if improved.      . Shoulder pain, left 12/31/2011   History of rotator cuff tear on the right and now has nontraumatic left shoulder pain   . Syncope    June, 2013  . Uterine prolapse     Past Surgical History:  Procedure Laterality Date  . APPENDECTOMY  1968  . BREAST BIOPSY  1998  . MOHS SURGERY     for squamous cell ca left nose  . OVARIAN CYST SURGERY  1968  . ROTATOR CUFF REPAIR  2001  . TONSILLECTOMY AND ADENOIDECTOMY    . TUBAL LIGATION      MEDS:  Reviewed in EPIC and UTD  ALLERGIES: Patient has no known allergies.  Family History  Problem Relation Age of Onset  . Stroke Father     age 94  . Other Mother     old age died 49   .  Deep vein thrombosis Mother     SH:  Married, non smoker  Review of Systems  Constitutional: Positive for malaise/fatigue. Negative for chills, fever and weight loss.  Respiratory: Positive for cough. Negative for hemoptysis, sputum production and wheezing.   Cardiovascular: Negative for chest pain and palpitations.  Gastrointestinal: Negative.   Genitourinary: Negative for dysuria, flank pain, frequency, hematuria and urgency.  Psychiatric/Behavioral: Negative.     PHYSICAL EXAMINATION:    BP (!) 100/58 (BP Location: Right Arm, Patient Position: Sitting, Cuff Size: Normal)   Pulse 68   Resp 14   Ht 5' 4.5" (1.638 m)   Wt 141 lb 12.8 oz (64.3 kg)   LMP 10/28/1995   BMI 23.96 kg/m     General appearance: alert, cooperative and appears stated age Neck: no  adenopathy, supple, symmetrical, trachea midline and thyroid normal to inspection and palpation CV:  Regular rate and rhythm Lungs:  clear to auscultation, no wheezes, rales or rhonchi, symmetric air entry Abdomen: soft, non-tender; bowel sounds normal; no masses,  no organomegaly.  Non surgical abdomen.  Pelvic: External genitalia:  no lesions              Urethra:  normal appearing urethra with no masses, tenderness or lesions              Bartholins and Skenes: normal                 Vagina: normal appearing vagina with normal color and discharge, no lesions, second degree rectocele noted, atrophic changes noted              Cervix: no lesions              Bimanual Exam:  Uterus:  normal size, contour, position, consistency, mobility, non-tender              Adnexa: no mass, fullness, tenderness              BME:  Significant tenderness to pelvic floor              Anus:  normal sphincter tone, no lesions  Chaperone was present for exam.  Assessment: Pelvic pain, most likely from pelvic floor tenderness Rectocele with pessary use Cough present for five weeks  Plan: Will restart estrace cream 1 grm pv twice weekly.  Pt has declined doing MMGs so will use this only on short term basis. Motrin 800mg  TID.  Pt will use OTC. Trial of flexeril 10mg  nightly.  Pt aware this can make her very sleepy. Urine culture pending Return for PUS Referral to PT

## 2016-12-23 ENCOUNTER — Telehealth: Payer: Self-pay | Admitting: Obstetrics & Gynecology

## 2016-12-23 NOTE — Telephone Encounter (Signed)
Patient called to cancel her appointment for an ultrasound on 12/30/16. Patient states she is recovering from pneumonia and states she will call us back when she is ready to reschedule.  Routing to Dr Sabra Heck  cc: Lamont Snowball

## 2016-12-23 NOTE — Telephone Encounter (Signed)
Patient was seen on 12/16/16 for pelvic pain. Had been sick for several weeks at that time. Defer PUS order for 2 weeks then lets call her back if she has not called to reschedule.   Routing to Dr Talbert Nan while Dr Sabra Heck out of office. Routing to provider for final review. Will close encounter.

## 2016-12-29 ENCOUNTER — Telehealth: Payer: Self-pay | Admitting: Obstetrics & Gynecology

## 2016-12-29 NOTE — Telephone Encounter (Signed)
Received phone call from [facility] stating patient is refusing referral for physical therapy at this time.  Referral closed. Routing to provider for review prior to closing.

## 2016-12-30 ENCOUNTER — Other Ambulatory Visit: Payer: Medicare Other

## 2016-12-30 ENCOUNTER — Other Ambulatory Visit: Payer: Medicare Other | Admitting: Obstetrics & Gynecology

## 2016-12-30 NOTE — Telephone Encounter (Signed)
Thank you for the update.  Encounter closed. 

## 2017-02-02 DIAGNOSIS — L814 Other melanin hyperpigmentation: Secondary | ICD-10-CM | POA: Diagnosis not present

## 2017-02-02 DIAGNOSIS — D2261 Melanocytic nevi of right upper limb, including shoulder: Secondary | ICD-10-CM | POA: Diagnosis not present

## 2017-02-02 DIAGNOSIS — D225 Melanocytic nevi of trunk: Secondary | ICD-10-CM | POA: Diagnosis not present

## 2017-02-02 DIAGNOSIS — L570 Actinic keratosis: Secondary | ICD-10-CM | POA: Diagnosis not present

## 2017-02-02 DIAGNOSIS — I788 Other diseases of capillaries: Secondary | ICD-10-CM | POA: Diagnosis not present

## 2017-02-09 ENCOUNTER — Telehealth: Payer: Self-pay | Admitting: *Deleted

## 2017-02-09 NOTE — Telephone Encounter (Signed)
Follow-up call to patient regarding pelvic ultrasound. Advised Dr Sabra Heck wanted to follow-up and see if she was feeling better and wanted to proceed with ultrasound. Patient states cough and pneumonia have resolved but she declines to schedule ultrasound. States she will just leave that alone. Attempted to advised to call back if pain returned but call dropped mid-sentence.  Routing to provider for final review. Will close encounter.

## 2017-03-12 ENCOUNTER — Ambulatory Visit: Payer: Medicare Other | Admitting: Sports Medicine

## 2017-06-08 ENCOUNTER — Encounter: Payer: Self-pay | Admitting: Family Medicine

## 2017-06-08 ENCOUNTER — Ambulatory Visit (INDEPENDENT_AMBULATORY_CARE_PROVIDER_SITE_OTHER): Payer: Medicare Other | Admitting: Family Medicine

## 2017-06-08 VITALS — BP 110/80 | HR 64 | Resp 12 | Ht 64.5 in | Wt 138.1 lb

## 2017-06-08 DIAGNOSIS — M7701 Medial epicondylitis, right elbow: Secondary | ICD-10-CM

## 2017-06-08 DIAGNOSIS — R233 Spontaneous ecchymoses: Secondary | ICD-10-CM | POA: Diagnosis not present

## 2017-06-08 LAB — CBC
HCT: 42.8 % (ref 36.0–46.0)
Hemoglobin: 14.5 g/dL (ref 12.0–15.0)
MCHC: 34 g/dL (ref 30.0–36.0)
MCV: 89.3 fl (ref 78.0–100.0)
Platelets: 234 10*3/uL (ref 150.0–400.0)
RBC: 4.8 Mil/uL (ref 3.87–5.11)
RDW: 13.4 % (ref 11.5–15.5)
WBC: 5.1 10*3/uL (ref 4.0–10.5)

## 2017-06-08 NOTE — Progress Notes (Signed)
ACUTE VISIT   HPI:  Chief Complaint  Patient presents with  . right elbow bruising    [name] is a 71 y.o. female, who is here today complaining of bruised area around right elbow.  About 7 days ago she felt some tenderness around the area, next day she noted ecchymosis and mild edema on medial aspect of right elbow. She denies any injury or unusual activity. States that she bruises easily but usually it is associated with some type of trauma. She plays tennis but she has not done recently.   Elbow tenderness has resolved.  She denies nose/gum bleed, ecchymosis elsewhere, blood in the stool, or gross hematuria. No abnormal weight loss. No Hx of tobacco use. No limitation of range of motion. She has not tried OTC medications.   Other  This is a new problem. The current episode started in the past 7 days. The problem has been gradually improving. Pertinent negatives include no abdominal pain, arthralgias, coughing, diaphoresis, fatigue, fever, headaches, joint swelling, myalgias, nausea, neck pain, numbness, rash, sore throat, swollen glands, vomiting or weakness. Nothing aggravates the symptoms. She has tried nothing for the symptoms.   She denies OTC NSAIDs intake and she is not on aspirin. Denies Hx of alcohol abuse or high intake, usually she has 2 drinks per week, socially.    Review of Systems  Constitutional: Negative for appetite change, diaphoresis, fatigue and fever.  HENT: Negative for mouth sores, nosebleeds, sore throat, trouble swallowing and voice change.   Eyes: Negative for redness and visual disturbance.  Respiratory: Negative for cough, chest tightness, shortness of breath and wheezing.   Cardiovascular: Negative for palpitations and leg swelling.  Gastrointestinal: Negative for abdominal pain, blood in stool, nausea and vomiting.  Endocrine: Negative for cold intolerance and heat intolerance.  Genitourinary: Negative for decreased urine  volume and hematuria.  Musculoskeletal: Negative for arthralgias, joint swelling, myalgias and neck pain.  Skin: Negative for rash and wound.  Neurological: Negative for tremors, weakness, numbness and headaches.  Hematological: Negative for adenopathy. Bruises/bleeds easily.  Psychiatric/Behavioral: Negative for confusion.      Current Outpatient Prescriptions on File Prior to Visit  Medication Sig Dispense Refill  . cyclobenzaprine (FLEXERIL) 10 MG tablet Take 1 tablet (10 mg total) by mouth at bedtime. 30 tablet 0  . estradiol (ESTRACE) 0.1 MG/GM vaginal cream 1 gram vaginally twice weekly 42.5 g 1  . ibuprofen (ADVIL,MOTRIN) 800 MG tablet Take 1 tablet (800 mg total) by mouth every 8 (eight) hours as needed. 30 tablet 0  . Phenazopyridine HCl (AZO-STANDARD PO) Take by mouth.     No current facility-administered medications on file prior to visit.      Past Medical History:  Diagnosis Date  . Biceps tendinopathy 01/14/2012  . 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 Hearing aid worn   . Hepatitis A   . Hip pain, right 12/31/2011   A mild amount of arthritis is probable with the limitation of rotation but the key finding today was weakness in abduction   . 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  [name] Hx of varicella   . HX: benign breast biopsy   . Left rotator cuff tear 01/14/2012   Supraspinatus tear noted on ultrasound. Provided instructions regarding nitroglycerin patches 1/4 patch daily to shoulder.   To continue circular motion exercises and range of motion. No over the head exercises.  No weight bearing  on that side. She can continue to play tennis as tolerated.  FU in 2 weeks if no improvement, 4 weeks if improved.      . Shoulder pain, left 12/31/2011   History of rotator cuff tear on the right and now has nontraumatic left shoulder pain   . Syncope    June, 2013  . Uterine prolapse    No Known Allergies  Social History   Social History  . Marital status:  Married    Spouse name: N/A  . Number of children: N/A  . Years of education: N/A   Social History Main Topics  . Smoking status: Never Smoker  . Smokeless tobacco: Never Used  . Alcohol use 1.0 - 1.5 oz/week    2 - 3 drink(s) per week     Comment: occ  . Drug use: No  . Sexual activity: No     Comment: BTL   Other Topics Concern  . 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 Sees dentist on a regular basis.    etoh 3 x per week                 Vitals:   06/08/17 0850  BP: 110/80  Pulse: 64  Resp: 12  SpO2: 97%   Body mass index is 23.34 kg/m.   Physical Exam  Nursing note and vitals reviewed. Constitutional: She is oriented to person, place, and time. She appears well-developed and well-nourished. No distress.  HENT:  Head: Atraumatic.  Mouth/Throat: Oropharynx is clear and moist and mucous membranes are normal.  Eyes: Conjunctivae are normal.  Cardiovascular: Normal rate and regular rhythm.   No murmur heard. Pulses:      Radial pulses are 2+ on the right side.  Respiratory: Effort normal and breath sounds normal. No respiratory distress.  GI: Soft. She exhibits no mass. There is no hepatosplenomegaly. There is no tenderness.  Musculoskeletal: She exhibits edema (Trace pitting edema LE, bilateral.).       Right elbow: She exhibits normal range of motion, no swelling, no effusion and no deformity. Tenderness (Mild) found. Medial epicondyle tenderness noted. No olecranon process tenderness noted.  Lymphadenopathy:    She has no cervical adenopathy.  Neurological: She is alert and oriented to person, place, and time. She has normal strength. Gait normal.  Skin: Skin is warm. Ecchymosis noted. No abrasion and no rash noted. No erythema.  Ecchymosis on medial aspect right  elbow, extends from right above and under joint, no hematoma or masses. A smaller ecchymosis under olecranon.  Psychiatric: She has a normal mood and affect.  Well groomed, good eye contact.     ASSESSMENT AND PLAN:   Ms. Laura Curtis was seen today for right elbow bruising.  Diagnoses and all orders for this visit:  Spontaneous ecchymosis -     CBC  Epicondylitis elbow, medial, right  Traumatic ?? She has some tenderness upon examination, so minor trauma could be the etiology. I don't think imaging is needed at this time. Sun screening recommended to prevent post inflammatory hyperpigmentation changes. Instructed about warning signs. Follow-up with PCP as needed.    -Ms.BLEN RANSOME was advised to seek immediate medical attention if sudden worsening  symptoms or to follow if they persist or if new concerns arise.       Beverlyann Broxterman G. [place], MD  [facility]. [place] office.

## 2017-06-08 NOTE — Patient Instructions (Signed)
  [name] I have seen you today for an acute visit.  A few things to remember from today's visit:   Spontaneous ecchymosis - Plan: CBC  Monitor for new lesions.  Sun screening. Today blood test.  In general please monitor for signs of worsening symptoms and seek immediate medical attention if any concerning.

## 2017-06-21 NOTE — Progress Notes (Signed)
Chief Complaint  Patient presents with  . Annual Exam    Pt has no complaints today    HPI: Laura Curtis 71 y.o. comes in today for Preventive Medicare exam/ wellness visit .Since last visit. Doing well but did have Had bruising r elbow medical  and nl cbc no trauma obs and no lom  Resolved tend sot bruise eay but  No bleeding . No fam hx colon cancer  No gi bleeding   fam hx   Age 2 mom died  .   fx pelvis 84 . After a fall   Health Maintenance  Topic Date Due  . Hepatitis C Screening  07-09-1946  . COLONOSCOPY  12/04/1993  . INFLUENZA VACCINE  05/27/2017  . TETANUS/TDAP  01/13/2019  . DEXA SCAN  Completed  . PNA vac Low Risk Adult  Completed   Health Maintenance Review LIFESTYLE:  Exercise:   Tennis and gardening   walkin g.  Tobacco/ETS: no Alcohol:  2-3 per week  Sugar beverages:n Sleep: about  Once walkening   10 - 6  Drug use: no HH: 2    Hearing:   Hearing aids   Vision:  No limitations at present . Last eye check UTD  Safety:  Has smoke detector and wears seat belts.  No firearms. No excess sun exposure. Sees dentist regularly.  Falls:  no  Advance directive :  Reviewed  Has one.  Memory: Felt to be good  , no concern from her or her family.  Depression: No anhedonia unusual crying or depressive symptoms  Nutrition: Eats well balanced diet; adequate calcium and vitamin D. No swallowing chewing problems.  Injury: no major injuries in the last six months.  Other healthcare providers:  Reviewed today .  Social:  Lives with spouse married. No pets.   Preventive parameters: up-to-date  Reviewed   ADLS:   There are no problems or need for assistance  driving, feeding, obtaining food, dressing, toileting and bathing, managing money using phone. She is independent.   ROS:  GEN/ HEENT: No fever, significant weight changes sweats headaches vision problems hearing changes, CV/ PULM; No chest pain shortness of breath cough, syncope,edema  change in  exercise tolerance. GI /GU: No adominal pain, vomiting, change in bowel habits. No blood in the stool. No significant GU symptoms. SKIN/HEME: ,no acute skin rashes suspicious lesions or bleeding. No lymphadenopathy, nodules, masses.  NEURO/ PSYCH:  No neurologic signs such as weakness numbness. No depression anxiety. IMM/ Allergy: No unusual infections.  Allergy .   REST of 12 system review negative except as per HPI   Past Medical History:  Diagnosis Date  . Biceps tendinopathy 01/14/2012  . 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 Hearing aid worn   . Hepatitis A   . Hip pain, right 12/31/2011   A mild amount of arthritis is probable with the limitation of rotation but the key finding today was weakness in abduction   . 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  [name] Hx of varicella   . HX: benign breast biopsy   . Left rotator cuff tear 01/14/2012   Supraspinatus tear noted on ultrasound. Provided instructions regarding nitroglycerin patches 1/4 patch daily to shoulder.   To continue circular motion exercises and range of motion. No over the head exercises.  No weight bearing on that side. She can continue to play tennis as tolerated.  FU in 2 weeks if no improvement,  4 weeks if improved.      . Shoulder pain, left 12/31/2011   History of rotator cuff tear on the right and now has nontraumatic left shoulder pain   . Syncope    June, 2013  . Uterine prolapse     Family History  Problem Relation Age of Onset  . Stroke Father        age 105  . Other Mother        old age died 48   . Deep vein thrombosis Mother     Social History   Social History  . Marital status: Married    Spouse name: N/A  . Number of children: N/A  . Years of education: N/A   Social History Main Topics  . Smoking status: Never Smoker  . Smokeless tobacco: Never Used  . Alcohol use 1.0 - 1.5 oz/week    2 - 3 drink(s) per week     Comment: occ  . Drug use: No  . Sexual activity: No     Comment:  BTL   Other Topics Concern  . 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 Sees dentist on a regular basis.    etoh 3 x per week                 Outpatient Encounter Prescriptions as of 06/22/2017  Medication Sig  . estradiol (ESTRACE) 0.1 MG/GM vaginal cream 1 gram vaginally twice weekly  . ibuprofen (ADVIL,MOTRIN) 800 MG tablet Take 1 tablet (800 mg total) by mouth every 8 (eight) hours as needed. (Patient not taking: Reported on 06/22/2017)  . Zoster Vac Recomb Adjuvanted [facility]) injection Inject 0.5 mLs into the muscle once. Repeat in 2-6 months  . [DISCONTINUED] cyclobenzaprine (FLEXERIL) 10 MG tablet Take 1 tablet (10 mg total) by mouth at bedtime. (Patient not taking: Reported on 06/22/2017)  . [DISCONTINUED] Phenazopyridine HCl (AZO-STANDARD PO) Take by mouth.   No facility-administered encounter medications on file as of 06/22/2017.     EXAM:  BP 108/60 (BP Location: Right Arm, Patient Position: Sitting, Cuff Size: Normal)   Pulse (!) 59   Temp 98.1 F (36.7 C) (Oral)   Ht 5' 4.5" (1.638 m)   Wt 139 lb (63 kg)   LMP 10/28/1995   SpO2 97%   BMI 23.49 kg/m   Body mass index is 23.49 kg/m.  Physical Exam: Vital signs reviewed YSA:YTKZ is a well-developed well-nourished alert cooperative   who appears stated age in no acute distress.  HEENT: normocephalic atraumatic , Eyes: PERRL EOM's full, conjunctiva clear, Nares: paten,t no deformity discharge or tenderness., Ears: no deformity EAC's clear TMs with normal landmarks. Mouth: clear OP, no lesions, edema.  Moist mucous membranes. Dentition in adequate repair. NECK: supple without masses, thyromegaly or bruits. CHEST/PULM:  Clear to auscultation and percussion breath sounds equal no wheeze , rales or rhonchi. No chest wall  deformities or tenderness. CV: PMI is nondisplaced, S1 S2 no gallops, murmurs, rubs. Peripheral pulses are full without delay.No JVD . Breast: normal by inspection . No dimpling, discharge, masses, tenderness or discharge . ABDOMEN: Bowel sounds normal nontender  No guard or rebound, no hepato splenomegal no CVA tenderness.   Extremtities:  No clubbing cyanosis or edema, no acute joint swelling or redness no focal atrophy NEURO:  Oriented x3, cranial nerves 3-12 appear to be intact, no obvious focal weakness,gait within normal limits no abnormal reflexes or asymmetrical SKIN: No acute rashes normal turgor, color, no bruising or petechiae. PSYCH: Oriented, good eye contact, no obvious depression anxiety, cognition and judgment appear normal. LN: no cervical axillary inguinal adenopathy No noted deficits in memory, attention, and speech.   Lab Results  Component Value Date   WBC 5.1 06/08/2017   HGB 14.5 06/08/2017   HCT 42.8 06/08/2017   PLT 234.0 06/08/2017   GLUCOSE 84 06/13/2016   CHOL 234 (H) 06/13/2016   TRIG 46.0 06/13/2016   HDL 88.80 06/13/2016   LDLDIRECT 138.7 07/27/2012   LDLCALC 136 (H) 06/13/2016   ALT 13 06/13/2016   AST 16 06/13/2016   NA 139 06/13/2016   K 3.9 06/13/2016   CL 102 06/13/2016   CREATININE 0.75 06/13/2016   BUN 16 06/13/2016   CO2 30 06/13/2016   TSH 3.41 06/13/2016    ASSESSMENT AND PLAN:  Discussed the following assessment and plan:  Visit for preventive health examination  Screen for colon cancer  Mammogram declined  Need for immunization against influenza - Plan: Flu vaccine HIGH DOSE PF (Fluzone High dose)  Hearing aid worn reviewed labs  Normal exam  Uncertain cause of the bruise since resolved fu for alarm sx etc    Expectant management.  Disc bone health Patient Care Team: Panosh, Standley Brooking, MD as PCP - General (Internal Medicine) Martinique, Amy, MD as Consulting Physician (Dermatology) Megan Salon, MD as Consulting Physician  (Gynecology)  Patient Instructions  Can get shingrix  Vaccine at your convenience . Get back with [place] if further bruising issues.   Can get DEXA scan ext year .   cologuard if negative  Good for 3 years.     Preventive Care 72 Years and Older, Female Preventive care refers to lifestyle choices and visits with your health care provider that can promote health and wellness. What does preventive care include?  A yearly physical exam. This is also called an annual well check.  Dental exams once or twice a year.  Routine eye exams. Ask your health care provider how often you should have your eyes checked.  Personal lifestyle choices, including: ? Daily care of your teeth and gums. ? Regular physical activity. ? Eating a healthy diet. ? Avoiding tobacco and drug use. ? Limiting alcohol use. ? Practicing safe sex. ? Taking low-dose aspirin every day. ? Taking vitamin and mineral supplements as recommended by your health care provider. What happens during an annual well check? The services and screenings done by your health care provider during your annual well check will depend on your age, overall health, lifestyle risk factors, and family history of disease. Counseling Your health care provider may ask you questions about your:  Alcohol use.  Tobacco use.  Drug use.  Emotional well-being.  Home and relationship well-being.  Sexual activity.  Eating habits.  History of falls.  Memory and ability to understand (cognition).  Work and work Statistician.  Reproductive health.  Screening You may have the following tests or measurements:  Height, weight, and BMI.  Blood pressure.  Lipid and cholesterol levels. These may be checked every 5 years, or more frequently if you are over 81 years old.  Skin check.  Lung cancer screening. You may have this screening every year starting at age 70  if you have a 30-pack-year history of smoking and currently smoke or have  quit within the past 15 years.  Fecal occult blood test (FOBT) of the stool. You may have this test every year starting at age 66.  Flexible sigmoidoscopy or colonoscopy. You may have a sigmoidoscopy every 5 years or a colonoscopy every 10 years starting at age 70.  Hepatitis C blood test.  Hepatitis B blood test.  Sexually transmitted disease (STD) testing.  Diabetes screening. This is done by checking your blood sugar (glucose) after you have not eaten for a while (fasting). You may have this done every 1-3 years.  Bone density scan. This is done to screen for osteoporosis. You may have this done starting at age 62.  Mammogram. This may be done every 1-2 years. Talk to your health care provider about how often you should have regular mammograms.  Talk with your health care provider about your test results, treatment options, and if necessary, the need for more tests. Vaccines Your health care provider may recommend certain vaccines, such as:  Influenza vaccine. This is recommended every year.  Tetanus, diphtheria, and acellular pertussis (Tdap, Td) vaccine. You may need a Td booster every 10 years.  Varicella vaccine. You may need this if you have not been vaccinated.  Zoster vaccine. You may need this after age 79.  Measles, mumps, and rubella (MMR) vaccine. You may need at least one dose of MMR if you were born in 1957 or later. You may also need a second dose.  Pneumococcal 13-valent conjugate (PCV13) vaccine. One dose is recommended after age 66.  Pneumococcal polysaccharide (PPSV23) vaccine. One dose is recommended after age 51.  Meningococcal vaccine. You may need this if you have certain conditions.  Hepatitis A vaccine. You may need this if you have certain conditions or if you travel or work in places where you may be exposed to hepatitis A.  Hepatitis B vaccine. You may need this if you have certain conditions or if you travel or work in places where you may be  exposed to hepatitis B.  Haemophilus influenzae type b (Hib) vaccine. You may need this if you have certain conditions.  Talk to your health care provider about which screenings and vaccines you need and how often you need them. This information is not intended to replace advice given to you by your health care provider. Make sure you discuss any questions you have with your health care provider. Document Released: 11/09/2015 Document Revised: 07/02/2016 Document Reviewed: 08/14/2015 Elsevier Interactive Patient Education  2017 [place] protect organs, store calcium, and anchor muscles. Good health habits, such as eating nutritious foods and exercising regularly, are important for maintaining healthy bones. They can also help to prevent a condition that causes bones to lose density and become weak and brittle (osteoporosis). Why is bone mass important? Bone mass refers to the amount of bone tissue that you have. The higher your bone mass, the stronger your bones. An important step toward having healthy bones throughout life is to have strong and dense bones during childhood. A young adult who has a high bone mass is more likely to have a high bone mass later in life. Bone mass at its greatest it is called peak bone mass. A large decline in bone mass occurs in older adults. In women, it occurs about the time of menopause. During this time, it is important to practice good health habits, because if more bone is  lost than what is replaced, the bones will become less healthy and more likely to break (fracture). If you find that you have a low bone mass, you may be able to prevent osteoporosis or further bone loss by changing your diet and lifestyle. How can I find out if my bone mass is low? Bone mass can be measured with an X-ray test that is called a bone mineral density (BMD) test. This test is recommended for all women who are age 37 or older. It may also be recommended for  men who are age 76 or older, or for people who are more likely to develop osteoporosis due to:  Having bones that break easily.  Having a long-term disease that weakens bones, such as kidney disease or rheumatoid arthritis.  Having menopause earlier than normal.  Taking medicine that weakens bones, such as steroids, thyroid hormones, or hormone treatment for breast cancer or prostate cancer.  Smoking.  Drinking three or more alcoholic drinks each day.  What are the nutritional recommendations for healthy bones? To have healthy bones, you need to get enough of the right minerals and vitamins. Most nutrition experts recommend getting these nutrients from the foods that you eat. Nutritional recommendations vary from person to person. Ask your health care provider what is healthy for you. Here are some general guidelines. Calcium Recommendations Calcium is the most important (essential) mineral for bone health. Most people can get enough calcium from their diet, but supplements may be recommended for people who are at risk for osteoporosis. Good sources of calcium include:  Dairy products, such as low-fat or nonfat milk, cheese, and yogurt.  Dark green leafy vegetables, such as bok choy and broccoli.  Calcium-fortified foods, such as orange juice, cereal, bread, soy beverages, and tofu products.  Nuts, such as almonds.  Follow these recommended amounts for daily calcium intake:  Children, age 24?3: 700 mg.  Children, age 16?8: 1,000 mg.  Children, age 80?13: 1,300 mg.  Teens, age 160?18: 1,300 mg.  Adults, age 51?50: 1,000 mg.  Adults, age 165?70: ? Men: 1,000 mg. ? Women: 1,200 mg.  Adults, age 27 or older: 1,200 mg.  Pregnant and breastfeeding females: ? Teens: 1,300 mg. ? Adults: 1,000 mg.  Vitamin D Recommendations Vitamin D is the most essential vitamin for bone health. It helps the body to absorb calcium. Sunlight stimulates the skin to make vitamin D, so be sure to get  enough sunlight. If you live in a cold climate or you do not get outside often, your health care provider may recommend that you take vitamin D supplements. Good sources of vitamin D in your diet include:  Egg yolks.  Saltwater fish.  Milk and cereal fortified with vitamin D.  Follow these recommended amounts for daily vitamin D intake:  Children and teens, age 22?18: 31 international units.  Adults, age 50 or younger: 400-800 international units.  Adults, age 84 or older: 800-1,000 international units.  Other Nutrients Other nutrients for bone health include:  Phosphorus. This mineral is found in meat, poultry, dairy foods, nuts, and legumes. The recommended daily intake for adult men and adult women is 700 mg.  Magnesium. This mineral is found in seeds, nuts, dark green vegetables, and legumes. The recommended daily intake for adult men is 400?420 mg. For adult women, it is 310?320 mg.  Vitamin K. This vitamin is found in green leafy vegetables. The recommended daily intake is 120 mg for adult men and 90 mg for adult women.  What type of physical activity is best for building and maintaining healthy bones? Weight-bearing and strength-building activities are important for building and maintaining peak bone mass. Weight-bearing activities cause muscles and bones to work against gravity. Strength-building activities increases muscle strength that supports bones. Weight-bearing and muscle-building activities include:  Walking and hiking.  Jogging and running.  Dancing.  Gym exercises.  Lifting weights.  Tennis and racquetball.  Climbing stairs.  Aerobics.  Adults should get at least 30 minutes of moderate physical activity on most days. Children should get at least 60 minutes of moderate physical activity on most days. Ask your health care provide what type of exercise is best for you. Where can I find more information? For more information, check out the following  websites:  [place]: YardHomes.se  [employer] of Health: http://www.niams.AnonymousEar.fr.asp  This information is not intended to replace advice given to you by your health care provider. Make sure you discuss any questions you have with your health care provider. Document Released: 01/03/2004 Document Revised: 05/02/2016 Document Reviewed: 10/18/2014 Elsevier Interactive Patient Education  2018 [place]. Panosh M.D.

## 2017-06-22 ENCOUNTER — Ambulatory Visit (INDEPENDENT_AMBULATORY_CARE_PROVIDER_SITE_OTHER): Payer: Medicare Other | Admitting: Internal Medicine

## 2017-06-22 ENCOUNTER — Encounter: Payer: Self-pay | Admitting: Internal Medicine

## 2017-06-22 VITALS — BP 108/60 | HR 59 | Temp 98.1°F | Ht 64.5 in | Wt 139.0 lb

## 2017-06-22 DIAGNOSIS — Z5320 Procedure and treatment not carried out because of patient's decision for unspecified reasons: Secondary | ICD-10-CM | POA: Diagnosis not present

## 2017-06-22 DIAGNOSIS — Z23 Encounter for immunization: Secondary | ICD-10-CM | POA: Diagnosis not present

## 2017-06-22 DIAGNOSIS — Z974 Presence of external hearing-aid: Secondary | ICD-10-CM | POA: Diagnosis not present

## 2017-06-22 DIAGNOSIS — Z1211 Encounter for screening for malignant neoplasm of colon: Secondary | ICD-10-CM

## 2017-06-22 DIAGNOSIS — Z0000 Encounter for general adult medical examination without abnormal findings: Secondary | ICD-10-CM | POA: Diagnosis not present

## 2017-06-22 MED ORDER — ZOSTER VAC RECOMB ADJUVANTED 50 MCG/0.5ML IM SUSR: 0.5000 mL | Freq: Once | INTRAMUSCULAR | 1 refills | Status: AC

## 2017-06-22 NOTE — Patient Instructions (Addendum)
Can get shingrix  Vaccine at your convenience . Get back with [place] if further bruising issues.   Can get DEXA scan ext year .   cologuard if negative  Good for 3 years.     Preventive Care 7 Years and Older, Female Preventive care refers to lifestyle choices and visits with your health care provider that can promote health and wellness. What does preventive care include?  A yearly physical exam. This is also called an annual well check.  Dental exams once or twice a year.  Routine eye exams. Ask your health care provider how often you should have your eyes checked.  Personal lifestyle choices, including: ? Daily care of your teeth and gums. ? Regular physical activity. ? Eating a healthy diet. ? Avoiding tobacco and drug use. ? Limiting alcohol use. ? Practicing safe sex. ? Taking low-dose aspirin every day. ? Taking vitamin and mineral supplements as recommended by your health care provider. What happens during an annual well check? The services and screenings done by your health care provider during your annual well check will depend on your age, overall health, lifestyle risk factors, and family history of disease. Counseling Your health care provider may ask you questions about your:  Alcohol use.  Tobacco use.  Drug use.  Emotional well-being.  Home and relationship well-being.  Sexual activity.  Eating habits.  History of falls.  Memory and ability to understand (cognition).  Work and work Statistician.  Reproductive health.  Screening You may have the following tests or measurements:  Height, weight, and BMI.  Blood pressure.  Lipid and cholesterol levels. These may be checked every 5 years, or more frequently if you are over 70 years old.  Skin check.  Lung cancer screening. You may have this screening every year starting at age 86 if you have a 30-pack-year history of smoking and currently smoke or have quit within the past 15 years.  Fecal  occult blood test (FOBT) of the stool. You may have this test every year starting at age 26.  Flexible sigmoidoscopy or colonoscopy. You may have a sigmoidoscopy every 5 years or a colonoscopy every 10 years starting at age 59.  Hepatitis C blood test.  Hepatitis B blood test.  Sexually transmitted disease (STD) testing.  Diabetes screening. This is done by checking your blood sugar (glucose) after you have not eaten for a while (fasting). You may have this done every 1-3 years.  Bone density scan. This is done to screen for osteoporosis. You may have this done starting at age 76.  Mammogram. This may be done every 1-2 years. Talk to your health care provider about how often you should have regular mammograms.  Talk with your health care provider about your test results, treatment options, and if necessary, the need for more tests. Vaccines Your health care provider may recommend certain vaccines, such as:  Influenza vaccine. This is recommended every year.  Tetanus, diphtheria, and acellular pertussis (Tdap, Td) vaccine. You may need a Td booster every 10 years.  Varicella vaccine. You may need this if you have not been vaccinated.  Zoster vaccine. You may need this after age 1.  Measles, mumps, and rubella (MMR) vaccine. You may need at least one dose of MMR if you were born in 1957 or later. You may also need a second dose.  Pneumococcal 13-valent conjugate (PCV13) vaccine. One dose is recommended after age 23.  Pneumococcal polysaccharide (PPSV23) vaccine. One dose is recommended after age 33.  Meningococcal vaccine. You may need this if you have certain conditions.  Hepatitis A vaccine. You may need this if you have certain conditions or if you travel or work in places where you may be exposed to hepatitis A.  Hepatitis B vaccine. You may need this if you have certain conditions or if you travel or work in places where you may be exposed to hepatitis B.  Haemophilus  influenzae type b (Hib) vaccine. You may need this if you have certain conditions.  Talk to your health care provider about which screenings and vaccines you need and how often you need them. This information is not intended to replace advice given to you by your health care provider. Make sure you discuss any questions you have with your health care provider. Document Released: 11/09/2015 Document Revised: 07/02/2016 Document Reviewed: 08/14/2015 Elsevier Interactive Patient Education  2017 [place] protect organs, store calcium, and anchor muscles. Good health habits, such as eating nutritious foods and exercising regularly, are important for maintaining healthy bones. They can also help to prevent a condition that causes bones to lose density and become weak and brittle (osteoporosis). Why is bone mass important? Bone mass refers to the amount of bone tissue that you have. The higher your bone mass, the stronger your bones. An important step toward having healthy bones throughout life is to have strong and dense bones during childhood. A young adult who has a high bone mass is more likely to have a high bone mass later in life. Bone mass at its greatest it is called peak bone mass. A large decline in bone mass occurs in older adults. In women, it occurs about the time of menopause. During this time, it is important to practice good health habits, because if more bone is lost than what is replaced, the bones will become less healthy and more likely to break (fracture). If you find that you have a low bone mass, you may be able to prevent osteoporosis or further bone loss by changing your diet and lifestyle. How can I find out if my bone mass is low? Bone mass can be measured with an X-ray test that is called a bone mineral density (BMD) test. This test is recommended for all women who are age 54 or older. It may also be recommended for men who are age 52 or older, or for  people who are more likely to develop osteoporosis due to:  Having bones that break easily.  Having a long-term disease that weakens bones, such as kidney disease or rheumatoid arthritis.  Having menopause earlier than normal.  Taking medicine that weakens bones, such as steroids, thyroid hormones, or hormone treatment for breast cancer or prostate cancer.  Smoking.  Drinking three or more alcoholic drinks each day.  What are the nutritional recommendations for healthy bones? To have healthy bones, you need to get enough of the right minerals and vitamins. Most nutrition experts recommend getting these nutrients from the foods that you eat. Nutritional recommendations vary from person to person. Ask your health care provider what is healthy for you. Here are some general guidelines. Calcium Recommendations Calcium is the most important (essential) mineral for bone health. Most people can get enough calcium from their diet, but supplements may be recommended for people who are at risk for osteoporosis. Good sources of calcium include:  Dairy products, such as low-fat or nonfat milk, cheese, and yogurt.  Dark green leafy vegetables, such as [employer]  and broccoli.  Calcium-fortified foods, such as orange juice, cereal, bread, soy beverages, and tofu products.  Nuts, such as almonds.  Follow these recommended amounts for daily calcium intake:  Children, age 68?3: 700 mg.  Children, age 60?8: 1,000 mg.  Children, age 688?13: 1,300 mg.  Teens, age 689?18: 1,300 mg.  Adults, age 609?50: 1,000 mg.  Adults, age 58?70: ? Men: 1,000 mg. ? Women: 1,200 mg.  Adults, age 20 or older: 1,200 mg.  Pregnant and breastfeeding females: ? Teens: 1,300 mg. ? Adults: 1,000 mg.  Vitamin D Recommendations Vitamin D is the most essential vitamin for bone health. It helps the body to absorb calcium. Sunlight stimulates the skin to make vitamin D, so be sure to get enough sunlight. If you live in a  cold climate or you do not get outside often, your health care provider may recommend that you take vitamin D supplements. Good sources of vitamin D in your diet include:  Egg yolks.  Saltwater fish.  Milk and cereal fortified with vitamin D.  Follow these recommended amounts for daily vitamin D intake:  Children and teens, age 36?18: 65 international units.  Adults, age 92 or younger: 400-800 international units.  Adults, age 83 or older: 800-1,000 international units.  Other Nutrients Other nutrients for bone health include:  Phosphorus. This mineral is found in meat, poultry, dairy foods, nuts, and legumes. The recommended daily intake for adult men and adult women is 700 mg.  Magnesium. This mineral is found in seeds, nuts, dark green vegetables, and legumes. The recommended daily intake for adult men is 400?420 mg. For adult women, it is 310?320 mg.  Vitamin K. This vitamin is found in green leafy vegetables. The recommended daily intake is 120 mg for adult men and 90 mg for adult women.  What type of physical activity is best for building and maintaining healthy bones? Weight-bearing and strength-building activities are important for building and maintaining peak bone mass. Weight-bearing activities cause muscles and bones to work against gravity. Strength-building activities increases muscle strength that supports bones. Weight-bearing and muscle-building activities include:  Walking and hiking.  Jogging and running.  Dancing.  Gym exercises.  Lifting weights.  Tennis and racquetball.  Climbing stairs.  Aerobics.  Adults should get at least 30 minutes of moderate physical activity on most days. Children should get at least 60 minutes of moderate physical activity on most days. Ask your health care provide what type of exercise is best for you. Where can I find more information? For more information, check out the following websites:  [place]: YardHomes.se  [employer] of Health: http://www.niams.AnonymousEar.fr.asp  This information is not intended to replace advice given to you by your health care provider. Make sure you discuss any questions you have with your health care provider. Document Released: 01/03/2004 Document Revised: 05/02/2016 Document Reviewed: 10/18/2014 Elsevier Interactive Patient Education  [employer].

## 2017-07-13 DIAGNOSIS — H16223 Keratoconjunctivitis sicca, not specified as Sjogren's, bilateral: Secondary | ICD-10-CM | POA: Diagnosis not present

## 2017-07-13 DIAGNOSIS — Z961 Presence of intraocular lens: Secondary | ICD-10-CM | POA: Diagnosis not present

## 2017-07-17 ENCOUNTER — Encounter: Payer: Self-pay | Admitting: Internal Medicine

## 2017-09-01 ENCOUNTER — Ambulatory Visit: Payer: Self-pay

## 2017-09-01 ENCOUNTER — Ambulatory Visit: Payer: Medicare Other | Admitting: Sports Medicine

## 2017-09-01 ENCOUNTER — Encounter: Payer: Self-pay | Admitting: Sports Medicine

## 2017-09-01 VITALS — BP 92/60 | Ht 64.0 in | Wt 135.0 lb

## 2017-09-01 DIAGNOSIS — M25561 Pain in right knee: Secondary | ICD-10-CM

## 2017-09-01 NOTE — Progress Notes (Signed)
Chief complaint: Right knee pain 4 weeks  History of present illness: Laura Curtis is a 71 year old female who presents to the sports medicine office today with chief complaint of right knee pain.  She reports that symptoms started insidiously about 4 weeks ago.  She does not report of any inciting incident, trauma, or injury to explain the pain.  She reports pain being in the right medial knee near the pes anserine bursa.  She reports noticing the pain at most while sitting during the night.  She reports no pain while playing tennis.  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 She does report in the area of the right pes anserine bursa having some warmth and swelling.  She does report of some crepitus with range of motion.  She reports that she only uses occasional Aleve.  She does not report of any numbness, tingling, or burning going down her entire leg.  She does not report of any right hip pain or low back pain.  She does not report of any fevers, chills, night sweats, or any unintentional weight loss. Today, she describes the pain as a 2/10.  Review of systems:  As stated above  Her past medical history, surgical history, family history, and social history obtained and unchanged. No history of type 2 diabetes, no current tobacco use. No surgeries involving the right knee.  Physical exam: Vital signs are reviewed and are documented in the chart Gen.: Alert, oriented, appears stated age, in no apparent distress HEENT: Moist oral mucosa Respiratory: Normal respirations, able to speak in full sentences Cardiac: Regular rate, distal pulses 2+ Integumentary: No rashes on visible skin:  Neurologic: Strength 5/5, sensation 2+ in bilateral lower extremities Psych: Normal affect, mood is described as good Musculoskeletal: Inspection of the right knee reveals no obvious deformity or muscle atrophy, no warmth, erythema, ecchymosis, or  effusion noted, she is tender palpation along the pes anserine bursa of the right knee as well as the medial joint line of the right knee, her range of motion in the right knee is from  0 to 130, crepitus is heard with the arc of range of motion, Lachman, anterior drawer, valgus, varus stress testing negative, McMurray positive for both pain and crepitus  Limited musculoskeletal ultrasound was performed in the office today of her right knee.  Key findings of the ultrasound today reveal that she does have right anterior tibia spurring, with calcifications slight effusion along the midportion of the medial joint line.   Assessment and plan: 1. Right knee pain, with medial compartment osteoarthritis and degenerative medial meniscal tearing and ultrasound findings right anterior tibial spurring  Plan: Reviewed ultrasound with patient in the office today, discussed that most of her pain that she is having near the pes anserine bursa is pain from right anterior tibia spurring ultimately from her right knee medial compartment osteoarthritis.  Discussed conservative management to include cryotherapy, compressive sleeve, using topical medications such as Aspercreme.  Also discussed with her to use a cushioned insole. Will have her follow-up on as-needed basis if she is not having any interval improvement in her symptoms.

## 2017-11-05 ENCOUNTER — Encounter: Payer: Self-pay | Admitting: Internal Medicine

## 2017-11-05 ENCOUNTER — Ambulatory Visit (INDEPENDENT_AMBULATORY_CARE_PROVIDER_SITE_OTHER): Payer: Medicare Other | Admitting: Internal Medicine

## 2017-11-05 VITALS — BP 114/82 | HR 65 | Temp 98.2°F | Wt 139.6 lb

## 2017-11-05 DIAGNOSIS — R0981 Nasal congestion: Secondary | ICD-10-CM | POA: Diagnosis not present

## 2017-11-05 DIAGNOSIS — J0100 Acute maxillary sinusitis, unspecified: Secondary | ICD-10-CM | POA: Diagnosis not present

## 2017-11-05 MED ORDER — AMOXICILLIN-POT CLAVULANATE 875-125 MG PO TABS: 1.0000 | ORAL_TABLET | Freq: Two times a day (BID) | ORAL | 1 refills | Status: DC

## 2017-11-05 NOTE — Progress Notes (Signed)
Chief Complaint  Patient presents with  . Sinus Problem    SInus pressure/pain x 2-3 days. Pt has not been able to use the neti pot d/t pain and sensitivity. C/o congestion, cheek pain and tooth aches    HPI: Laura Curtis 72 y.o.    Trying to irrigate and painful.  congested and no runny nose and left cheeck and teeth pain .  Left more congested.   About the same.  No coughing much  No fever .   No sneezing .    Hx of sinus problems  .   Left side of face plugged   Hx of  bloo din nose a lot  No nose bleed   Worse with dry air.   No fever   ROS: See pertinent positives and negatives per HPI.  Past Medical History:  Diagnosis Date  . Biceps tendinopathy 01/14/2012  . 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 Hearing aid worn   . Hepatitis A   . Hip pain, right 12/31/2011   A mild amount of arthritis is probable with the limitation of rotation but the key finding today was weakness in abduction   . 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  [name] Hx of varicella   . HX: benign breast biopsy   . Left rotator cuff tear 01/14/2012   Supraspinatus tear noted on ultrasound. Provided instructions regarding nitroglycerin patches 1/4 patch daily to shoulder.   To continue circular motion exercises and range of motion. No over the head exercises.  No weight bearing on that side. She can continue to play tennis as tolerated.  FU in 2 weeks if no improvement, 4 weeks if improved.      . Shoulder pain, left 12/31/2011   History of rotator cuff tear on the right and now has nontraumatic left shoulder pain   . Syncope    June, 2013  . Uterine prolapse     Family History  Problem Relation Age of Onset  . Stroke Father        age 44  . Other Mother        old age died 29   . Deep vein thrombosis Mother     Social History   Socioeconomic History  . Marital status: Married    Spouse name: None  . Number of children: None  . Years of education: None  . Highest education level: None    Social Needs  . Financial resource strain: None  . Food insecurity - worry: None  . Food insecurity - inability: None  . Transportation needs - medical: None  . Transportation needs - non-medical: None  Occupational History  . None  Tobacco Use  . Smoking status: Never Smoker  . Smokeless tobacco: Never Used  Substance and Sexual Activity  . Alcohol use: Yes    Alcohol/week: 1.0 - 1.5 oz    Types: 2 - 3 drink(s) per week    Comment: occ  . Drug use: No  . Sexual activity: No    Birth control/protection: None, Surgical    Comment: BTL  Other Topics Concern  . None  Social History Narrative   hh of 2 married      retired Chief Financial Officer.   In [place] 26 [place]years    From [place]   G4G3   Active  Heavy gardening exercise tennis    Wears seat belts , no firearms , ets, tanning  beds . Sees dentist on a regular basis.    etoh 3 x per week                 Outpatient Medications Prior to Visit  Medication Sig Dispense Refill  . estradiol (ESTRACE) 0.1 MG/GM vaginal cream 1 gram vaginally twice weekly 42.5 g 1  . ibuprofen (ADVIL,MOTRIN) 800 MG tablet Take 1 tablet (800 mg total) by mouth every 8 (eight) hours as needed. (Patient not taking: Reported on 11/05/2017) 30 tablet 0   No facility-administered medications prior to visit.      EXAM:  BP 114/82 (BP Location: Left Arm, Patient Position: Sitting, Cuff Size: Normal)   Pulse 65   Temp 98.2 F (36.8 C) (Oral)   Wt 139 lb 9.6 oz (63.3 kg)   LMP 10/28/1995   BMI 23.96 kg/m   Body mass index is 23.96 kg/m.  GENERAL: vitals reviewed and listed above, alert, oriented, appears well hydrated and in no acute distress HEENT: atraumatic, conjunctiva  clear, no obvious abnormalities on inspection of external nose and ears  Hearing aids   tms clear  Nares  No blood seen  L;eft  Maxillary tenderness  No rash  OP : no lesion edema or exudate  Mild erythema  No pnd seen  NECK: no obvious masses on inspection palpation  MS:  moves all extremities without noticeable focal  abnormality PSYCH: pleasant and cooperative, no obvious depression or anxiety  ASSESSMENT AND PLAN:  Discussed the following assessment and plan:  Acute maxillary sinusitis, recurrence not specified  Nasal congestion - chronic  Hx of same  Seems  Like chronic nasal congestion with secondary sx .  Cannot explain  Sx with  Saline  Spray issues   . Cont hot compresses   Consider short term afrin to open  up area and add antibiotic  5- 10 days    If  persistent or progressive consideration of ent eval sinus ct etc because of her  Chronic sx and  Recurrent problems in the winter especially .  -Patient advised to return or notify health care team  if symptoms worsen ,persist or new concerns arise.  Patient Instructions  Treating   For sinusitis   5-10 days  depending on how doing.   5 days   In addition try afrin for 2-3 days to see if  Can open up the sinus passages to help drain.   If fever not better in 3-5 days  Let us know    Ir if  persistent or progressive   If ongoing  Problem consider seeing ent and or ct of sinuses.     Standley Brooking. Modestine Scherzinger M.D.

## 2017-11-05 NOTE — Patient Instructions (Addendum)
Treating   For sinusitis   5-10 days  depending on how doing.   5 days   In addition try afrin for 2-3 days to see if  Can open up the sinus passages to help drain.   If fever not better in 3-5 days  Let us know    Ir if  persistent or progressive   If ongoing  Problem consider seeing ent and or ct of sinuses.

## 2018-01-19 ENCOUNTER — Ambulatory Visit: Payer: Medicare Other | Admitting: Family Medicine

## 2018-01-19 DIAGNOSIS — Z0289 Encounter for other administrative examinations: Secondary | ICD-10-CM

## 2018-01-26 ENCOUNTER — Ambulatory Visit (INDEPENDENT_AMBULATORY_CARE_PROVIDER_SITE_OTHER): Payer: Medicare Other | Admitting: Obstetrics & Gynecology

## 2018-01-26 ENCOUNTER — Other Ambulatory Visit: Payer: Self-pay

## 2018-01-26 ENCOUNTER — Other Ambulatory Visit (HOSPITAL_COMMUNITY)
Admission: RE | Admit: 2018-01-26 | Discharge: 2018-01-26 | Disposition: A | Discharge status: Home / Self Care | Payer: Medicare Other | Source: Ambulatory Visit | Attending: Obstetrics & Gynecology | Admitting: Obstetrics & Gynecology

## 2018-01-26 ENCOUNTER — Encounter: Payer: Self-pay | Admitting: Obstetrics & Gynecology

## 2018-01-26 VITALS — BP 102/70 | HR 68 | Resp 16 | Ht 64.0 in | Wt 140.0 lb

## 2018-01-26 DIAGNOSIS — Z01419 Encounter for gynecological examination (general) (routine) without abnormal findings: Secondary | ICD-10-CM

## 2018-01-26 DIAGNOSIS — Z124 Encounter for screening for malignant neoplasm of cervix: Secondary | ICD-10-CM

## 2018-01-26 NOTE — Patient Instructions (Signed)
[place], [place] [phone].  Shingrix vaccination, 2 shot series.

## 2018-01-26 NOTE — Progress Notes (Signed)
72 y.o. [id number] MarriedCaucasianF here for annual exam.  Doing well.  Denies vaginal bleeding.    Patient's last menstrual period was 10/28/1995.          Sexually active: No.  The current method of family planning is post menopausal status.    Exercising: Yes.    tennis, walking, gardening, gym Smoker:  no  Health Maintenance: Pap:  12/28/15 Neg   11/21/13 Neg  History of abnormal Pap:  no MMG:  Never Colonoscopy:  Never BMD:   2015 Normal  TDaP:  2010 Pneumonia vaccine(s):  2013 Shingrix: No Hep C testing: donates blood  Screening Labs: PCP   reports that she has never smoked. She has never used smokeless tobacco. She reports that she drinks about 1.0 - 1.5 oz of alcohol per week. She reports that she does not use drugs.  Past Medical History:  Diagnosis Date  . Biceps tendinopathy 01/14/2012  . 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 Hearing aid worn   . Hepatitis A   . Hip pain, right 12/31/2011   A mild amount of arthritis is probable with the limitation of rotation but the key finding today was weakness in abduction   . 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  [name] Hx of varicella   . HX: benign breast biopsy   . Left rotator cuff tear 01/14/2012   Supraspinatus tear noted on ultrasound. Provided instructions regarding nitroglycerin patches 1/4 patch daily to shoulder.   To continue circular motion exercises and range of motion. No over the head exercises.  No weight bearing on that side. She can continue to play tennis as tolerated.  FU in 2 weeks if no improvement, 4 weeks if improved.      . Shoulder pain, left 12/31/2011   History of rotator cuff tear on the right and now has nontraumatic left shoulder pain   . Syncope    June, 2013  . Uterine prolapse     Past Surgical History:  Procedure Laterality Date  . APPENDECTOMY  1968  . BREAST BIOPSY  1998  . MOHS SURGERY     for squamous cell ca left nose  . OVARIAN CYST SURGERY  1968  . ROTATOR CUFF REPAIR   2001  . TONSILLECTOMY AND ADENOIDECTOMY    . TUBAL LIGATION      Current Outpatient Medications  Medication Sig Dispense Refill  . estradiol (ESTRACE) 0.1 MG/GM vaginal cream 1 gram vaginally twice weekly 42.5 g 1   No current facility-administered medications for this visit.     Family History  Problem Relation Age of Onset  . Stroke Father        age 32  . Other Mother        old age died 68   . Deep vein thrombosis Mother     Review of Systems  HENT: Positive for hearing loss.   Skin: Positive for itching.  All other systems reviewed and are negative.   Exam:   BP 102/70 (BP Location: Left Arm, Patient Position: Sitting, Cuff Size: Normal)   Pulse 68   Resp 16   Ht 5\' 4"  (1.626 m)   Wt 140 lb (63.5 kg)   LMP 10/28/1995   BMI 24.03 kg/m    Height: 5\' 4"  (162.6 cm)  Ht Readings from Last 3 Encounters:  01/26/18 5\' 4"  (1.626 m)  09/01/17 5\' 4"  (1.626 m)  06/22/17 5' 4.5" (1.638 m)  General appearance: alert, cooperative and appears stated age Head: Normocephalic, without obvious abnormality, atraumatic Neck: no adenopathy, supple, symmetrical, trachea midline and thyroid normal to inspection and palpation Lungs: clear to auscultation bilaterally Breasts: normal appearance, no masses or tenderness Heart: regular rate and rhythm Abdomen: soft, non-tender; bowel sounds normal; no masses,  no organomegaly Extremities: extremities normal, atraumatic, no cyanosis or edema Skin: Skin color, texture, turgor normal. No rashes or lesions Lymph nodes: Cervical, supraclavicular, and axillary nodes normal. No abnormal inguinal nodes palpated Neurologic: Grossly normal   Pelvic: External genitalia:  no lesions              Urethra:  normal appearing urethra with no masses, tenderness or lesions              Bartholins and Skenes: normal                 Vagina: normal appearing vagina with normal color and discharge, no lesions              Cervix: no lesions               Pap taken: Yes.   Bimanual Exam:  Uterus:  normal size, contour, position, consistency, mobility, non-tender              Adnexa: normal adnexa and no mass, fullness, tenderness               Rectovaginal: Confirms               Anus:  normal sphincter tone, no lesions  Chaperone was present for exam.  A:  Well Woman with normal exam PMP, no HRT H/O LS&A, normal exam today H/O uterine prolapse with rectocele, uses #3 incontinence ring with support Mildly elevated lipids  P:   Mammogram guidelines reviewed.  Pt declines any invasive testing. She has not done a colonoscopy but willing to do cologuard.  Order will be placed for this. Blood work UTD. Would like shingrix vaccination.  Information provided. Pap smear obtained Pt is coming every two years.

## 2018-01-29 LAB — CYTOLOGY - PAP: Diagnosis: NEGATIVE

## 2018-02-02 DIAGNOSIS — D225 Melanocytic nevi of trunk: Secondary | ICD-10-CM | POA: Diagnosis not present

## 2018-02-02 DIAGNOSIS — L570 Actinic keratosis: Secondary | ICD-10-CM | POA: Diagnosis not present

## 2018-02-02 DIAGNOSIS — D1801 Hemangioma of skin and subcutaneous tissue: Secondary | ICD-10-CM | POA: Diagnosis not present

## 2018-02-02 DIAGNOSIS — L814 Other melanin hyperpigmentation: Secondary | ICD-10-CM | POA: Diagnosis not present

## 2018-02-02 DIAGNOSIS — L821 Other seborrheic keratosis: Secondary | ICD-10-CM | POA: Diagnosis not present

## 2018-03-09 DIAGNOSIS — Z1211 Encounter for screening for malignant neoplasm of colon: Secondary | ICD-10-CM | POA: Diagnosis not present

## 2018-03-09 DIAGNOSIS — Z1212 Encounter for screening for malignant neoplasm of rectum: Secondary | ICD-10-CM | POA: Diagnosis not present

## 2018-04-01 ENCOUNTER — Telehealth: Payer: Self-pay | Admitting: *Deleted

## 2018-04-01 DIAGNOSIS — Z1211 Encounter for screening for malignant neoplasm of colon: Secondary | ICD-10-CM

## 2018-04-01 LAB — COLOGUARD: Cologuard: NEGATIVE

## 2018-04-01 NOTE — Telephone Encounter (Signed)
Detailed message left with negative cologuard results. Advised patient to return call to office if any additional questions.   Encounter closed.

## 2018-04-12 ENCOUNTER — Encounter: Payer: Self-pay | Admitting: Obstetrics & Gynecology

## 2018-06-24 ENCOUNTER — Encounter: Payer: Medicare Other | Admitting: Internal Medicine

## 2018-06-24 ENCOUNTER — Telehealth: Payer: Self-pay | Admitting: Obstetrics & Gynecology

## 2018-06-24 NOTE — Telephone Encounter (Signed)
Patient has been having a discharge for 2 weeks with no odor.

## 2018-06-24 NOTE — Telephone Encounter (Signed)
Spoke with patient. Reports yellow vaginal d/c, no odor, for 2 wks. Has pessary in place, routinely removes for 3 days and replaces. Denies itching, urinary symptoms, pain or bleeding.   Recommended OV for further evaluation, OV scheduled on 8/30 at 8:30am with Dr. Sabra Heck.   Routing to provider for final review. Patient is agreeable to disposition. Will close encounter.

## 2018-06-24 NOTE — Telephone Encounter (Signed)
Left message to call Laura Curtis at [phone].  

## 2018-06-25 ENCOUNTER — Encounter: Payer: Self-pay | Admitting: Obstetrics & Gynecology

## 2018-06-25 ENCOUNTER — Ambulatory Visit: Payer: Medicare Other | Admitting: Obstetrics & Gynecology

## 2018-06-25 VITALS — BP 102/58 | HR 68 | Resp 16 | Ht 64.0 in | Wt 139.8 lb

## 2018-06-25 DIAGNOSIS — N898 Other specified noninflammatory disorders of vagina: Secondary | ICD-10-CM | POA: Diagnosis not present

## 2018-06-25 MED ORDER — METRONIDAZOLE 0.75 % VA GEL: 1.0000 | Freq: Every day | VAGINAL | 0 refills | Status: DC

## 2018-06-25 NOTE — Progress Notes (Signed)
GYNECOLOGY  VISIT  CC:   Vaginal discharg  HPI: 72 y.o. [id number] Married Caucasian female here for vaginal discharge x 2 weeks.  She is using a pessary.  Does not have pain.  Denies urinary symptoms.  Denies vaginal bleeding.  Denies itching.    GYNECOLOGIC HISTORY: Patient's last menstrual period was 10/28/1995. Contraception: post menopausal  Menopausal hormone therapy: none  Patient Active Problem List   Diagnosis Date Noted  . Degenerative tear of medial meniscus of right knee 04/01/2016  . Hearing aid worn   . Greater trochanteric bursitis of right hip 04/15/2013  . Internal hordeolum 01/11/2013  . Hx of syncope 07/21/2012  . Counseling on health promotion and disease prevention 07/21/2012  . Hx of nonmelanoma skin cancer 07/21/2012  . History of positive PPD   . HX: benign breast biopsy   . Biceps tendinopathy 01/14/2012  . Hip pain, right 12/31/2011    Past Medical History:  Diagnosis Date  . Biceps tendinopathy 01/14/2012  . 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 Hearing aid worn   . Hepatitis A   . Hip pain, right 12/31/2011   A mild amount of arthritis is probable with the limitation of rotation but the key finding today was weakness in abduction   . 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  [name] Hx of varicella   . HX: benign breast biopsy   . Left rotator cuff tear 01/14/2012   Supraspinatus tear noted on ultrasound. Provided instructions regarding nitroglycerin patches 1/4 patch daily to shoulder.   To continue circular motion exercises and range of motion. No over the head exercises.  No weight bearing on that side. She can continue to play tennis as tolerated.  FU in 2 weeks if no improvement, 4 weeks if improved.      . Shoulder pain, left 12/31/2011   History of rotator cuff tear on the right and now has nontraumatic left shoulder pain   . Syncope    June, 2013  . Uterine prolapse     Past Surgical History:  Procedure Laterality Date  .  APPENDECTOMY  1968  . BREAST BIOPSY  1998  . MOHS SURGERY     for squamous cell ca left nose  . OVARIAN CYST SURGERY  1968  . ROTATOR CUFF REPAIR  2001  . TONSILLECTOMY AND ADENOIDECTOMY    . TUBAL LIGATION      MEDS:   Current Outpatient Medications on File Prior to Visit  Medication Sig Dispense Refill  . estradiol (ESTRACE) 0.1 MG/GM vaginal cream 1 gram vaginally twice weekly 42.5 g 1   No current facility-administered medications on file prior to visit.     ALLERGIES: Patient has no known allergies.  Family History  Problem Relation Age of Onset  . Stroke Father        age 64  . Other Mother        old age died 30   . Deep vein thrombosis Mother     SH:  Married, non smoker  Review of Systems  Genitourinary:       Vaginal discharge   All other systems reviewed and are negative.   PHYSICAL EXAMINATION:    BP (!) 102/58 (BP Location: Right Arm, Patient Position: Sitting, Cuff Size: Normal)   Pulse 68   Resp 16   Ht 5\' 4"  (1.626 m)   Wt 139 lb 12.8 oz (63.4 kg)   LMP 10/28/1995  BMI 24.00 kg/m     General appearance: alert, cooperative and appears stated age Lymph:  no inguinal LAD noted  Pelvic: External genitalia:  no lesions              Urethra:  normal appearing urethra with no masses, tenderness or lesions              Bartholins and Skenes: normal                 Vagina: mild superficial denuded epithelium with associated discharge.  No bleeding.  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  No visible lesions.               Cervix: no lesions              Bimanual Exam:  Uterus:  normal size, contour, position, consistency, mobility, non-tender              Adnexa: no mass, fullness, tenderness  Chaperone was present for exam.  Assessment: Vaginal discharge  Plan: Affirm pending.  Will treat with Metrogel nightly and she will leave her pessary out at night for the next five days.

## 2018-06-26 LAB — VAGINITIS/VAGINOSIS, DNA PROBE
Candida Species: NEGATIVE
Gardnerella vaginalis: NEGATIVE
Trichomonas vaginosis: NEGATIVE

## 2018-07-01 ENCOUNTER — Telehealth: Payer: Self-pay | Admitting: *Deleted

## 2018-07-01 NOTE — Telephone Encounter (Signed)
Pt notified.  Verbalized understanding.

## 2018-07-01 NOTE — Telephone Encounter (Signed)
LM for pt to call back.

## 2018-07-01 NOTE — Telephone Encounter (Signed)
-----   Message from Megan Salon, MD sent at 06/30/2018  2:12 PM EDT ----- Please let pt know her vaginitis testing was negative.  She does use a pessary so the discharge may have come from the pessary rubbing the vagina.  If the discharge resolves, she can restart using the pessary as we discussed.

## 2018-07-12 NOTE — Progress Notes (Signed)
Chief Complaint  Patient presents with  . Annual Exam    Trouble with bending and muscle pain    HPI: Laura Curtis 72 y.o. comes in today for Preventive Medicare exam/ wellness visit .Since last visit.  Muscle   tightness and less endurance  For past months  lower energy but if walks miles  Feels some better  No cp sob  sycope has some  Arthritis  And   Using cbd  Oral and poss topical with help of pain  Doesn't think related  .   Health Maintenance  Topic Date Due  . Hepatitis C Screening  Mar 25, 1946  . COLONOSCOPY  12/04/1993  . INFLUENZA VACCINE  05/27/2018  . TETANUS/TDAP  01/13/2019  . DEXA SCAN  Completed  . PNA vac Low Risk Adult  Completed   Health Maintenance Review LIFESTYLE:  Exercise:  Gone down  Walking 3 miles per day  Began stretching exercises.  Not tennis from may .   Hands and  Energy medicine.     By mouth  .    Tobacco/ETS: no Alcohol:  1 per week  Sugar beverages:no Sleep: 7  Drug use: no HH: 2    Hearing:  New hearing test.  Aids doin well   Vision:  No limitations at present . Last eye check UTD  Safety:  Has smoke detector and wears seat belts.  . No excess sun exposure. Sees dentist regularly.  Falls: n  Advance directive :  Reviewed  Has one.  Memory: Felt to be good  , no concern from her or her family.  Depression: No anhedonia unusual crying or depressive symptoms  Nutrition: Eats well balanced diet; adequate calcium and vitamin D. No swallowing chewing problems.  Injury: no major injuries in the last six months.  Other healthcare providers:  Reviewed today .  Social:  Lives with spouse married.    Preventive parameters: declines mammogram Reviewed   ADLS:   There are no problems or need for assistance  driving, feeding, obtaining food, dressing, toileting and bathing, managing money using phone. She is independent.   ROS:  GEN/ HEENT: No fever, significant weight changes sweats headaches vision problems hearing  changes, CV/ PULM; No chest pain shortness of breath cough, syncope,edema  change in exercise tolerance. GI /GU: No adominal pain, vomiting, change in bowel habits. No blood in the stool. No significant GU symptoms. SKIN/HEME: ,no acute skin rashes suspicious lesions or bleeding. No lymphadenopathy, nodules, masses.  NEURO/ PSYCH:  No neurologic signs such as weakness numbness. No depression anxiety. IMM/ Allergy: No unusual infections.  Allergy .   REST of 12 system review negative except as per HPI   Past Medical History:  Diagnosis Date  . Biceps tendinopathy 01/14/2012  . 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 Hearing aid worn   . Hepatitis A   . Hip pain, right 12/31/2011   A mild amount of arthritis is probable with the limitation of rotation but the key finding today was weakness in abduction   . 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  [name] Hx of varicella   . HX: benign breast biopsy   . Left rotator cuff tear 01/14/2012   Supraspinatus tear noted on ultrasound. Provided instructions regarding nitroglycerin patches 1/4 patch daily to shoulder.   To continue circular motion exercises and range of motion. No over the head exercises.  No weight bearing on that side. She can  continue to play tennis as tolerated.  FU in 2 weeks if no improvement, 4 weeks if improved.      . Shoulder pain, left 12/31/2011   History of rotator cuff tear on the right and now has nontraumatic left shoulder pain   . Syncope    June, 2013  . Uterine prolapse     Family History  Problem Relation Age of Onset  . Stroke Father        age 38  . Other Mother        old age died 61   . Deep vein thrombosis Mother     Social History   Socioeconomic History  . Marital status: Married    Spouse name: Not on file  . Number of children: Not on file  . Years of education: Not on file  . Highest education level: Not on file  Occupational History  . Not on file  Social Needs  . Financial  resource strain: Not on file  . Food insecurity:    Worry: Not on file    Inability: Not on file  . Transportation needs:    Medical: Not on file    Non-medical: Not on file  Tobacco Use  . Smoking status: Never Smoker  . Smokeless tobacco: Never Used  Substance and Sexual Activity  . Alcohol use: Yes    Alcohol/week: 2.0 - 3.0 standard drinks    Types: 2 - 3 Standard drinks or equivalent per week    Comment: occ  . Drug use: No  . Sexual activity: Not Currently    Birth control/protection: None, Surgical    Comment: BTL  Lifestyle  . Physical activity:    Days per week: Not on file    Minutes per session: Not on file  . Stress: Not on file  Relationships  . Social connections:    Talks on phone: Not on file    Gets together: Not on file    Attends religious service: Not on file    Active member of club or organization: Not on file    Attends meetings of clubs or organizations: Not on file    Relationship status: Not on file  Other Topics Concern  . 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 Sees dentist on a regular basis.    etoh 3 x per week                 Outpatient Encounter Medications as of 07/14/2018  Medication Sig  . estradiol (ESTRACE) 0.1 MG/GM vaginal cream 1 gram vaginally twice weekly  . [DISCONTINUED] metroNIDAZOLE (METROGEL) 0.75 % vaginal gel Place 1 Applicatorful vaginally at bedtime. Use for five nights. (Patient not taking: Reported on 07/14/2018)   No facility-administered encounter medications on file as of 07/14/2018.     EXAM:  BP 102/66 (BP Location: Left Arm, Patient Position: Sitting, Cuff Size: Normal)   Pulse 62   Temp 98 F (36.7 C) (Oral)   Ht 5' 4.25" (1.632 m)   Wt 140 lb 9.6 oz (63.8 kg)   LMP 10/28/1995   BMI 23.95 kg/m   Body  mass index is 23.95 kg/m.  Physical Exam: Vital signs reviewed DZH:GDJM  is a well-developed well-nourished alert cooperative   who appears stated age in no acute distress.  HEENT: normocephalic atraumatic , Eyes: PERRL EOM's full, conjunctiva clear, Nares: paten,t no deformity discharge or tenderness., Ears: no deformity EAC's clear TMs with normal landmarks. Mouth: clear OP, no lesions, edema.  Moist mucous membranes. Dentition in adequate repair. NECK: supple without masses, thyromegaly or bruits. CHEST/PULM:  Clear to auscultation and percussion breath sounds equal no wheeze , rales or rhonchi. No chest wall deformities or tenderness.Breast: normal by inspection . No dimpling, discharge, masses, tenderness or discharge . CV: PMI is nondisplaced, S1 S2 no gallops, murmurs, rubs. Peripheral pulses are full without delay.No JVD .  ABDOMEN: Bowel sounds normal nontender  No guard or rebound, no hepato splenomegal no CVA tenderness.   Extremtities:  No clubbing cyanosis or edema, no acute joint swelling or redness no focal atrophy NEURO:  Oriented x3, cranial nerves 3-12 appear to be intact, no obvious focal weakness,gait within normal limits no abnormal reflexes or asymmetrical SKIN: No acute rashes normal turgor, color, no bruising or petechiae. PSYCH: Oriented, good eye contact, no obvious depression anxiety, cognition and judgment appear normal. LN: no cervical axillary inguinal adenopathy No noted deficits in memory, attention, and speech.   Lab Results  Component Value Date   WBC 7.3 07/14/2018   HGB 14.3 07/14/2018   HCT 41.7 07/14/2018   PLT 245.0 07/14/2018   GLUCOSE 91 07/14/2018   CHOL 222 (H) 07/14/2018   TRIG 88.0 07/14/2018   HDL 64.80 07/14/2018   LDLDIRECT 138.7 07/27/2012   LDLCALC 139 (H) 07/14/2018   ALT 18 07/14/2018   AST 19 07/14/2018   NA 137 07/14/2018   K 4.0 07/14/2018   CL 101 07/14/2018   CREATININE 0.65 07/14/2018   BUN 16 07/14/2018   CO2 31  07/14/2018   TSH 4.77 (H) 07/14/2018    ASSESSMENT AND PLAN:  Discussed the following assessment and plan:  Visit for preventive health examination  Hearing aid worn  Medication management - Plan: Basic metabolic panel, CBC with Differential/Platelet, Hepatic function panel, Lipid panel, TSH, T4, free, Sedimentation rate, C-reactive protein, CK, POCT Urinalysis Dipstick (Automated)  Need for influenza vaccination - Plan: Flu vaccine HIGH DOSE PF (Fluzone High dose)  Other fatigue - Plan: Basic metabolic panel, CBC with Differential/Platelet, Hepatic function panel, Lipid panel, TSH, T4, free, Sedimentation rate, C-reactive protein, CK, POCT Urinalysis Dipstick (Automated)  Muscle stiffness - Plan: Basic metabolic panel, CBC with Differential/Platelet, Hepatic function panel, Lipid panel, TSH, T4, free, Sedimentation rate, C-reactive protein, CK, POCT Urinalysis Dipstick (Automated)  Hyperlipidemia, unspecified hyperlipidemia type - Plan: Basic metabolic panel, CBC with Differential/Platelet, Hepatic function panel, Lipid panel, TSH, T4, free, Sedimentation rate, C-reactive protein, CK, POCT Urinalysis Dipstick (Automated)  Need for hepatitis C screening test - Plan: Hepatitis C antibody  Mammogram declined Exam is reassuring   Disc caution with   Untested cbd  products  .  Check metabolic  thryiod etc   If all ok  Add resistance training and  Follow  Up if progressive concerns  Patient Care Team: Andray Assefa, Standley Brooking, MD as PCP - General (Internal Medicine) Martinique, Amy, MD as Consulting Physician (Dermatology) Megan Salon, MD as Consulting Physician (Gynecology)  Patient Instructions  Will notify you  of labs when available.   Add gentle resistance training  As discussed .   attention to sleep   monitoring also   Fu if problematic  Progressing .    Health Maintenance, Female  Adopting a healthy lifestyle and getting preventive care can go a long way to promote health and  wellness. Talk with your health care provider about what schedule of regular examinations is right for you. This is a good chance for you to check in with your provider about disease prevention and staying healthy. In between checkups, there are plenty of things you can do on your own. Experts have done a lot of research about which lifestyle changes and preventive measures are most likely to keep you healthy. Ask your health care provider for more information. Weight and diet Eat a healthy diet  Be sure to include plenty of vegetables, fruits, low-fat dairy products, and lean protein.  Do not eat a lot of foods high in solid fats, added sugars, or salt.  Get regular exercise. This is one of the most important things you can do for your health. ? Most adults should exercise for at least 150 minutes each week. The exercise should increase your heart rate and make you sweat (moderate-intensity exercise). ? Most adults should also do strengthening exercises at least twice a week. This is in addition to the moderate-intensity exercise.  Maintain a healthy weight  Body mass index (BMI) is a measurement that can be used to identify possible weight problems. It estimates body fat based on height and weight. Your health care provider can help determine your BMI and help you achieve or maintain a healthy weight.  For females 46 years of age and older: ? A BMI below 18.5 is considered underweight. ? A BMI of 18.5 to 24.9 is normal. ? A BMI of 25 to 29.9 is considered overweight. ? A BMI of 30 and above is considered obese.  Watch levels of cholesterol and blood lipids  You should start having your blood tested for lipids and cholesterol at 74 years of age, then have this test every 5 years.  You may need to have your cholesterol levels checked more often if: ? Your lipid or cholesterol levels are high. ? You are older than 74 years of age. ? You are at high risk for heart disease.  Cancer  screening Lung Cancer  Lung cancer screening is recommended for adults 5-99 years old who are at high risk for lung cancer because of a history of smoking.  A yearly low-dose CT scan of the lungs is recommended for people who: ? Currently smoke. ? Have quit within the past 15 years. ? Have at least a 30-pack-year history of smoking. A pack year is smoking an average of one pack of cigarettes a day for 1 year.  Yearly screening should continue until it has been 15 years since you quit.  Yearly screening should stop if you develop a health problem that would prevent you from having lung cancer treatment.  Breast Cancer  Practice breast self-awareness. This means understanding how your breasts normally appear and feel.  It also means doing regular breast self-exams. Let your health care provider know about any changes, no matter how small.  If you are in your 20s or 30s, you should have a clinical breast exam (CBE) by a health care provider every 1-3 years as part of a regular health exam.  If you are 30 or older, have a CBE every year. Also consider having a breast X-ray (mammogram) every year.  If you have a family history of breast cancer, talk to your health care provider about genetic screening.  If you are at high risk for breast  cancer, talk to your health care provider about having an MRI and a mammogram every year.  Breast cancer gene (BRCA) assessment is recommended for women who have family members with BRCA-related cancers. BRCA-related cancers include: ? Breast. ? Ovarian. ? Tubal. ? Peritoneal cancers.  Results of the assessment will determine the need for genetic counseling and BRCA1 and BRCA2 testing.  Cervical Cancer Your health care provider may recommend that you be screened regularly for cancer of the pelvic organs (ovaries, uterus, and vagina). This screening involves a pelvic examination, including checking for microscopic changes to the surface of your cervix  (Pap test). You may be encouraged to have this screening done every 3 years, beginning at age 44.  For women ages 61-65, health care providers may recommend pelvic exams and Pap testing every 3 years, or they may recommend the Pap and pelvic exam, combined with testing for human papilloma virus (HPV), every 5 years. Some types of HPV increase your risk of cervical cancer. Testing for HPV may also be done on women of any age with unclear Pap test results.  Other health care providers may not recommend any screening for nonpregnant women who are considered low risk for pelvic cancer and who do not have symptoms. Ask your health care provider if a screening pelvic exam is right for you.  If you have had past treatment for cervical cancer or a condition that could lead to cancer, you need Pap tests and screening for cancer for at least 20 years after your treatment. If Pap tests have been discontinued, your risk factors (such as having a new sexual partner) need to be reassessed to determine if screening should resume. Some women have medical problems that increase the chance of getting cervical cancer. In these cases, your health care provider may recommend more frequent screening and Pap tests.  Colorectal Cancer  This type of cancer can be detected and often prevented.  Routine colorectal cancer screening usually begins at 74 years of age and continues through 74 years of age.  Your health care provider may recommend screening at an earlier age if you have risk factors for colon cancer.  Your health care provider may also recommend using home test kits to check for hidden blood in the stool.  A small camera at the end of a tube can be used to examine your colon directly (sigmoidoscopy or colonoscopy). This is done to check for the earliest forms of colorectal cancer.  Routine screening usually begins at age 65.  Direct examination of the colon should be repeated every 5-10 years through 74 years  of age. However, you may need to be screened more often if early forms of precancerous polyps or small growths are found.  Skin Cancer  Check your skin from head to toe regularly.  Tell your health care provider about any new moles or changes in moles, especially if there is a change in a mole's shape or color.  Also tell your health care provider if you have a mole that is larger than the size of a pencil eraser.  Always use sunscreen. Apply sunscreen liberally and repeatedly throughout the day.  Protect yourself by wearing long sleeves, pants, a wide-brimmed hat, and sunglasses whenever you are outside.  Heart disease, diabetes, and high blood pressure  High blood pressure causes heart disease and increases the risk of stroke. High blood pressure is more likely to develop in: ? People who have blood pressure in the high end of the normal  range (130-139/85-89 mm Hg). ? People who are overweight or obese. ? People who are African American.  If you are 54-35 years of age, have your blood pressure checked every 3-5 years. If you are 43 years of age or older, have your blood pressure checked every year. You should have your blood pressure measured twice-once when you are at a hospital or clinic, and once when you are not at a hospital or clinic. Record the average of the two measurements. To check your blood pressure when you are not at a hospital or clinic, you can use: ? An automated blood pressure machine at a pharmacy. ? A home blood pressure monitor.  If you are between 52 years and 69 years old, ask your health care provider if you should take aspirin to prevent strokes.  Have regular diabetes screenings. This involves taking a blood sample to check your fasting blood sugar level. ? If you are at a normal weight and have a low risk for diabetes, have this test once every three years after 74 years of age. ? If you are overweight and have a high risk for diabetes, consider being tested  at a younger age or more often. Preventing infection Hepatitis B  If you have a higher risk for hepatitis B, you should be screened for this virus. You are considered at high risk for hepatitis B if: ? You were born in a country where hepatitis B is common. Ask your health care provider which countries are considered high risk. ? Your parents were born in a high-risk country, and you have not been immunized against hepatitis B (hepatitis B vaccine). ? You have HIV or AIDS. ? You use needles to inject street drugs. ? You live with someone who has hepatitis B. ? You have had sex with someone who has hepatitis B. ? You get hemodialysis treatment. ? You take certain medicines for conditions, including cancer, organ transplantation, and autoimmune conditions.  Hepatitis C  Blood testing is recommended for: ? Everyone born from [birth year] through 1967. ? Anyone with known risk factors for hepatitis C.  Sexually transmitted infections (STIs)  You should be screened for sexually transmitted infections (STIs) including gonorrhea and chlamydia if: ? You are sexually active and are younger than 74 years of age. ? You are older than 74 years of age and your health care provider tells you that you are at risk for this type of infection. ? Your sexual activity has changed since you were last screened and you are at an increased risk for chlamydia or gonorrhea. Ask your health care provider if you are at risk.  If you do not have HIV, but are at risk, it may be recommended that you take a prescription medicine daily to prevent HIV infection. This is called pre-exposure prophylaxis (PrEP). You are considered at risk if: ? You are sexually active and do not regularly use condoms or know the HIV status of your partner(s). ? You take drugs by injection. ? You are sexually active with a partner who has HIV.  Talk with your health care provider about whether you are at high risk of being infected with HIV. If  you choose to begin PrEP, you should first be tested for HIV. You should then be tested every 3 months for as long as you are taking PrEP. Pregnancy  If you are premenopausal and you may become pregnant, ask your health care provider about preconception counseling.  If you may become pregnant, take  400 to 800 micrograms (mcg) of folic acid every day.  If you want to prevent pregnancy, talk to your health care provider about birth control (contraception). Osteoporosis and menopause  Osteoporosis is a disease in which the bones lose minerals and strength with aging. This can result in serious bone fractures. Your risk for osteoporosis can be identified using a bone density scan.  If you are 37 years of age or older, or if you are at risk for osteoporosis and fractures, ask your health care provider if you should be screened.  Ask your health care provider whether you should take a calcium or vitamin D supplement to lower your risk for osteoporosis.  Menopause may have certain physical symptoms and risks.  Hormone replacement therapy may reduce some of these symptoms and risks. Talk to your health care provider about whether hormone replacement therapy is right for you. Follow these instructions at home:  Schedule regular health, dental, and eye exams.  Stay current with your immunizations.  Do not use any tobacco products including cigarettes, chewing tobacco, or electronic cigarettes.  If you are pregnant, do not drink alcohol.  If you are breastfeeding, limit how much and how often you drink alcohol.  Limit alcohol intake to no more than 1 drink per day for nonpregnant women. One drink equals 12 ounces of beer, 5 ounces of wine, or 1 ounces of hard liquor.  Do not use street drugs.  Do not share needles.  Ask your health care provider for help if you need support or information about quitting drugs.  Tell your health care provider if you often feel depressed.  Tell your  health care provider if you have ever been abused or do not feel safe at home. This information is not intended to replace advice given to you by your health care provider. Make sure you discuss any questions you have with your health care provider. Document Released: 04/28/2011 Document Revised: 03/20/2016 Document Reviewed: 07/17/2015 Elsevier Interactive Patient Education  2018 [place]. Nelsie Domino M.D.

## 2018-07-14 ENCOUNTER — Ambulatory Visit (INDEPENDENT_AMBULATORY_CARE_PROVIDER_SITE_OTHER): Payer: Medicare Other | Admitting: Internal Medicine

## 2018-07-14 ENCOUNTER — Encounter: Payer: Self-pay | Admitting: Internal Medicine

## 2018-07-14 VITALS — BP 102/66 | HR 62 | Temp 98.0°F | Ht 64.25 in | Wt 140.6 lb

## 2018-07-14 DIAGNOSIS — R5383 Other fatigue: Secondary | ICD-10-CM | POA: Diagnosis not present

## 2018-07-14 DIAGNOSIS — Z1159 Encounter for screening for other viral diseases: Secondary | ICD-10-CM

## 2018-07-14 DIAGNOSIS — M6289 Other specified disorders of muscle: Secondary | ICD-10-CM | POA: Diagnosis not present

## 2018-07-14 DIAGNOSIS — Z5320 Procedure and treatment not carried out because of patient's decision for unspecified reasons: Secondary | ICD-10-CM | POA: Diagnosis not present

## 2018-07-14 DIAGNOSIS — E785 Hyperlipidemia, unspecified: Secondary | ICD-10-CM | POA: Diagnosis not present

## 2018-07-14 DIAGNOSIS — Z23 Encounter for immunization: Secondary | ICD-10-CM | POA: Diagnosis not present

## 2018-07-14 DIAGNOSIS — Z974 Presence of external hearing-aid: Secondary | ICD-10-CM

## 2018-07-14 DIAGNOSIS — Z79899 Other long term (current) drug therapy: Secondary | ICD-10-CM

## 2018-07-14 DIAGNOSIS — Z0000 Encounter for general adult medical examination without abnormal findings: Secondary | ICD-10-CM

## 2018-07-14 LAB — BASIC METABOLIC PANEL
BUN: 16 mg/dL (ref 6–23)
CO2: 31 meq/L (ref 19–32)
CREATININE: 0.65 mg/dL (ref 0.40–1.20)
Calcium: 9.4 mg/dL (ref 8.4–10.5)
Chloride: 101 mEq/L (ref 96–112)
GFR: 94.55 mL/min (ref >60.00)
GLUCOSE: 91 mg/dL (ref 70–99)
POTASSIUM: 4 meq/L (ref 3.5–5.1)
Sodium: 137 mEq/L (ref 135–145)

## 2018-07-14 LAB — CBC WITH DIFFERENTIAL/PLATELET
BASOS PCT: 0.4 % (ref 0.0–3.0)
Basophils Absolute: 0 10*3/uL (ref 0.0–0.1)
EOS PCT: 0.5 % (ref 0.0–5.0)
Eosinophils Absolute: 0 10*3/uL (ref 0.0–0.7)
HCT: 41.7 % (ref 36.0–46.0)
HEMOGLOBIN: 14.3 g/dL (ref 12.0–15.0)
Lymphocytes Relative: 20.1 % (ref 12.0–46.0)
Lymphs Abs: 1.5 10*3/uL (ref 0.7–4.0)
MCHC: 34.4 g/dL (ref 30.0–36.0)
MCV: 87.9 fl (ref 78.0–100.0)
MONO ABS: 0.6 10*3/uL (ref 0.1–1.0)
Monocytes Relative: 7.7 % (ref 3.0–12.0)
Neutro Abs: 5.2 10*3/uL (ref 1.4–7.7)
Neutrophils Relative %: 71.3 % (ref 43.0–77.0)
Platelets: 245 10*3/uL (ref 150.0–400.0)
RBC: 4.74 Mil/uL (ref 3.87–5.11)
RDW: 13.4 % (ref 11.5–15.5)
WBC: 7.3 10*3/uL (ref 4.0–10.5)

## 2018-07-14 LAB — LIPID PANEL
CHOLESTEROL: 222 mg/dL — AB (ref 0–200)
HDL: 64.8 mg/dL (ref >39.00)
LDL CALC: 139 mg/dL — AB (ref 0–99)
NonHDL: 157.09
TRIGLYCERIDES: 88 mg/dL (ref 0.0–149.0)
Total CHOL/HDL Ratio: 3
VLDL: 17.6 mg/dL (ref 0.0–40.0)

## 2018-07-14 LAB — HEPATIC FUNCTION PANEL
ALBUMIN: 4.2 g/dL (ref 3.5–5.2)
ALT: 18 U/L (ref 0–35)
AST: 19 U/L (ref 0–37)
Alkaline Phosphatase: 96 U/L (ref 39–117)
BILIRUBIN TOTAL: 0.6 mg/dL (ref 0.2–1.2)
Bilirubin, Direct: 0.1 mg/dL (ref 0.0–0.3)
TOTAL PROTEIN: 6.8 g/dL (ref 6.0–8.3)

## 2018-07-14 LAB — CK: Total CK: 36 U/L (ref 7–177)

## 2018-07-14 LAB — SEDIMENTATION RATE: Sed Rate: 11 mm/hr (ref 0–30)

## 2018-07-14 LAB — T4, FREE: Free T4: 0.8 ng/dL (ref 0.60–1.60)

## 2018-07-14 LAB — TSH: TSH: 4.77 u[IU]/mL — ABNORMAL HIGH (ref 0.35–4.50)

## 2018-07-14 LAB — C-REACTIVE PROTEIN: CRP: 2 mg/dL (ref 0.5–20.0)

## 2018-07-14 NOTE — Patient Instructions (Addendum)
Will notify you  of labs when available.   Add gentle resistance training  As discussed .   attention to sleep   monitoring also   Fu if problematic  Progressing .    Health Maintenance, Female Adopting a healthy lifestyle and getting preventive care can go a long way to promote health and wellness. Talk with your health care provider about what schedule of regular examinations is right for you. This is a good chance for you to check in with your provider about disease prevention and staying healthy. In between checkups, there are plenty of things you can do on your own. Experts have done a lot of research about which lifestyle changes and preventive measures are most likely to keep you healthy. Ask your health care provider for more information. Weight and diet Eat a healthy diet  Be sure to include plenty of vegetables, fruits, low-fat dairy products, and lean protein.  Do not eat a lot of foods high in solid fats, added sugars, or salt.  Get regular exercise. This is one of the most important things you can do for your health. ? Most adults should exercise for at least 150 minutes each week. The exercise should increase your heart rate and make you sweat (moderate-intensity exercise). ? Most adults should also do strengthening exercises at least twice a week. This is in addition to the moderate-intensity exercise.  Maintain a healthy weight  Body mass index (BMI) is a measurement that can be used to identify possible weight problems. It estimates body fat based on height and weight. Your health care provider can help determine your BMI and help you achieve or maintain a healthy weight.  For females 16 years of age and older: ? A BMI below 18.5 is considered underweight. ? A BMI of 18.5 to 24.9 is normal. ? A BMI of 25 to 29.9 is considered overweight. ? A BMI of 30 and above is considered obese.  Watch levels of cholesterol and blood lipids  You should start having your blood  tested for lipids and cholesterol at 74 years of age, then have this test every 5 years.  You may need to have your cholesterol levels checked more often if: ? Your lipid or cholesterol levels are high. ? You are older than 74 years of age. ? You are at high risk for heart disease.  Cancer screening Lung Cancer  Lung cancer screening is recommended for adults 8-69 years old who are at high risk for lung cancer because of a history of smoking.  A yearly low-dose CT scan of the lungs is recommended for people who: ? Currently smoke. ? Have quit within the past 15 years. ? Have at least a 30-pack-year history of smoking. A pack year is smoking an average of one pack of cigarettes a day for 1 year.  Yearly screening should continue until it has been 15 years since you quit.  Yearly screening should stop if you develop a health problem that would prevent you from having lung cancer treatment.  Breast Cancer  Practice breast self-awareness. This means understanding how your breasts normally appear and feel.  It also means doing regular breast self-exams. Let your health care provider know about any changes, no matter how small.  If you are in your 20s or 30s, you should have a clinical breast exam (CBE) by a health care provider every 1-3 years as part of a regular health exam.  If you are 94 or older, have a  CBE every year. Also consider having a breast X-ray (mammogram) every year.  If you have a family history of breast cancer, talk to your health care provider about genetic screening.  If you are at high risk for breast cancer, talk to your health care provider about having an MRI and a mammogram every year.  Breast cancer gene (BRCA) assessment is recommended for women who have family members with BRCA-related cancers. BRCA-related cancers include: ? Breast. ? Ovarian. ? Tubal. ? Peritoneal cancers.  Results of the assessment will determine the need for genetic counseling and  BRCA1 and BRCA2 testing.  Cervical Cancer Your health care provider may recommend that you be screened regularly for cancer of the pelvic organs (ovaries, uterus, and vagina). This screening involves a pelvic examination, including checking for microscopic changes to the surface of your cervix (Pap test). You may be encouraged to have this screening done every 3 years, beginning at age 82.  For women ages 81-65, health care providers may recommend pelvic exams and Pap testing every 3 years, or they may recommend the Pap and pelvic exam, combined with testing for human papilloma virus (HPV), every 5 years. Some types of HPV increase your risk of cervical cancer. Testing for HPV may also be done on women of any age with unclear Pap test results.  Other health care providers may not recommend any screening for nonpregnant women who are considered low risk for pelvic cancer and who do not have symptoms. Ask your health care provider if a screening pelvic exam is right for you.  If you have had past treatment for cervical cancer or a condition that could lead to cancer, you need Pap tests and screening for cancer for at least 20 years after your treatment. If Pap tests have been discontinued, your risk factors (such as having a new sexual partner) need to be reassessed to determine if screening should resume. Some women have medical problems that increase the chance of getting cervical cancer. In these cases, your health care provider may recommend more frequent screening and Pap tests.  Colorectal Cancer  This type of cancer can be detected and often prevented.  Routine colorectal cancer screening usually begins at 74 years of age and continues through 74 years of age.  Your health care provider may recommend screening at an earlier age if you have risk factors for colon cancer.  Your health care provider may also recommend using home test kits to check for hidden blood in the stool.  A small camera  at the end of a tube can be used to examine your colon directly (sigmoidoscopy or colonoscopy). This is done to check for the earliest forms of colorectal cancer.  Routine screening usually begins at age 1.  Direct examination of the colon should be repeated every 5-10 years through 74 years of age. However, you may need to be screened more often if early forms of precancerous polyps or small growths are found.  Skin Cancer  Check your skin from head to toe regularly.  Tell your health care provider about any new moles or changes in moles, especially if there is a change in a mole's shape or color.  Also tell your health care provider if you have a mole that is larger than the size of a pencil eraser.  Always use sunscreen. Apply sunscreen liberally and repeatedly throughout the day.  Protect yourself by wearing long sleeves, pants, a wide-brimmed hat, and sunglasses whenever you are outside.  Heart disease,  diabetes, and high blood pressure  High blood pressure causes heart disease and increases the risk of stroke. High blood pressure is more likely to develop in: ? People who have blood pressure in the high end of the normal range (130-139/85-89 mm Hg). ? People who are overweight or obese. ? People who are African American.  If you are 74-33 years of age, have your blood pressure checked every 3-5 years. If you are 80 years of age or older, have your blood pressure checked every year. You should have your blood pressure measured twice-once when you are at a hospital or clinic, and once when you are not at a hospital or clinic. Record the average of the two measurements. To check your blood pressure when you are not at a hospital or clinic, you can use: ? An automated blood pressure machine at a pharmacy. ? A home blood pressure monitor.  If you are between 30 years and 39 years old, ask your health care provider if you should take aspirin to prevent strokes.  Have regular diabetes  screenings. This involves taking a blood sample to check your fasting blood sugar level. ? If you are at a normal weight and have a low risk for diabetes, have this test once every three years after 74 years of age. ? If you are overweight and have a high risk for diabetes, consider being tested at a younger age or more often. Preventing infection Hepatitis B  If you have a higher risk for hepatitis B, you should be screened for this virus. You are considered at high risk for hepatitis B if: ? You were born in a country where hepatitis B is common. Ask your health care provider which countries are considered high risk. ? Your parents were born in a high-risk country, and you have not been immunized against hepatitis B (hepatitis B vaccine). ? You have HIV or AIDS. ? You use needles to inject street drugs. ? You live with someone who has hepatitis B. ? You have had sex with someone who has hepatitis B. ? You get hemodialysis treatment. ? You take certain medicines for conditions, including cancer, organ transplantation, and autoimmune conditions.  Hepatitis C  Blood testing is recommended for: ? Everyone born from [birth year] through 1967. ? Anyone with known risk factors for hepatitis C.  Sexually transmitted infections (STIs)  You should be screened for sexually transmitted infections (STIs) including gonorrhea and chlamydia if: ? You are sexually active and are younger than 74 years of age. ? You are older than 74 years of age and your health care provider tells you that you are at risk for this type of infection. ? Your sexual activity has changed since you were last screened and you are at an increased risk for chlamydia or gonorrhea. Ask your health care provider if you are at risk.  If you do not have HIV, but are at risk, it may be recommended that you take a prescription medicine daily to prevent HIV infection. This is called pre-exposure prophylaxis (PrEP). You are considered at risk  if: ? You are sexually active and do not regularly use condoms or know the HIV status of your partner(s). ? You take drugs by injection. ? You are sexually active with a partner who has HIV.  Talk with your health care provider about whether you are at high risk of being infected with HIV. If you choose to begin PrEP, you should first be tested for HIV. You should  then be tested every 3 months for as long as you are taking PrEP. Pregnancy  If you are premenopausal and you may become pregnant, ask your health care provider about preconception counseling.  If you may become pregnant, take 400 to 800 micrograms (mcg) of folic acid every day.  If you want to prevent pregnancy, talk to your health care provider about birth control (contraception). Osteoporosis and menopause  Osteoporosis is a disease in which the bones lose minerals and strength with aging. This can result in serious bone fractures. Your risk for osteoporosis can be identified using a bone density scan.  If you are 24 years of age or older, or if you are at risk for osteoporosis and fractures, ask your health care provider if you should be screened.  Ask your health care provider whether you should take a calcium or vitamin D supplement to lower your risk for osteoporosis.  Menopause may have certain physical symptoms and risks.  Hormone replacement therapy may reduce some of these symptoms and risks. Talk to your health care provider about whether hormone replacement therapy is right for you. Follow these instructions at home:  Schedule regular health, dental, and eye exams.  Stay current with your immunizations.  Do not use any tobacco products including cigarettes, chewing tobacco, or electronic cigarettes.  If you are pregnant, do not drink alcohol.  If you are breastfeeding, limit how much and how often you drink alcohol.  Limit alcohol intake to no more than 1 drink per day for nonpregnant women. One drink  equals 12 ounces of beer, 5 ounces of wine, or 1 ounces of hard liquor.  Do not use street drugs.  Do not share needles.  Ask your health care provider for help if you need support or information about quitting drugs.  Tell your health care provider if you often feel depressed.  Tell your health care provider if you have ever been abused or do not feel safe at home. This information is not intended to replace advice given to you by your health care provider. Make sure you discuss any questions you have with your health care provider. Document Released: 04/28/2011 Document Revised: 03/20/2016 Document Reviewed: 07/17/2015 Elsevier Interactive Patient Education  [employer].

## 2018-07-15 ENCOUNTER — Encounter: Payer: Self-pay | Admitting: Obstetrics & Gynecology

## 2018-07-15 ENCOUNTER — Ambulatory Visit: Payer: Medicare Other | Admitting: Obstetrics & Gynecology

## 2018-07-15 VITALS — BP 102/70 | HR 72 | Resp 16 | Ht 64.25 in | Wt 139.4 lb

## 2018-07-15 DIAGNOSIS — N898 Other specified noninflammatory disorders of vagina: Secondary | ICD-10-CM

## 2018-07-15 DIAGNOSIS — R300 Dysuria: Secondary | ICD-10-CM | POA: Diagnosis not present

## 2018-07-15 LAB — POCT URINALYSIS DIPSTICK
BILIRUBIN UA: NEGATIVE
Glucose, UA: NEGATIVE
Ketones, UA: NEGATIVE
Nitrite, UA: NEGATIVE
Protein, UA: NEGATIVE
Urobilinogen, UA: 0.2 E.U./dL
pH, UA: 5 (ref 5.0–8.0)

## 2018-07-15 LAB — HEPATITIS C ANTIBODY
Hepatitis C Ab: NONREACTIVE
SIGNAL TO CUT-OFF: 0.01 (ref <1.00)

## 2018-07-15 MED ORDER — SULFAMETHOXAZOLE-TRIMETHOPRIM 800-160 MG PO TABS: 1.0000 | ORAL_TABLET | Freq: Two times a day (BID) | ORAL | 0 refills | Status: DC

## 2018-07-15 NOTE — Progress Notes (Signed)
GYNECOLOGY  VISIT  CC:   dysuria  HPI: 72 y.o. [id number] [name] female here for dysuria x 5 days.  This has gotten better and worse throughout the day.  Reports she saw her PCP yesterday and she was not feeling the symptoms very much so she didn't even mention it.  It worsened last night and she decided to be seen.  Denies pelvic pain or back pain.  Has no fevers.  Denies hematuria but urine has been cloudy.  Denies vaginal bleeding.  Does use a pessary but she removed it today.m  GYNECOLOGIC HISTORY: Patient's last menstrual period was 10/28/1995. Contraception: post menopausal  Menopausal hormone therapy: estradiol vag cream   Patient Active Problem List   Diagnosis Date Noted  . Degenerative tear of medial meniscus of right knee 04/01/2016  . Hearing aid worn   . Greater trochanteric bursitis of right hip 04/15/2013  . Internal hordeolum 01/11/2013  . Hx of syncope 07/21/2012  . Counseling on health promotion and disease prevention 07/21/2012  . Hx of nonmelanoma skin cancer 07/21/2012  . History of positive PPD   . HX: benign breast biopsy   . Biceps tendinopathy 01/14/2012  . Hip pain, right 12/31/2011    Past Medical History:  Diagnosis Date  . Biceps tendinopathy 01/14/2012  . 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 Hearing aid worn   . Hepatitis A   . Hip pain, right 12/31/2011   A mild amount of arthritis is probable with the limitation of rotation but the key finding today was weakness in abduction   . 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  [name] Hx of varicella   . HX: benign breast biopsy   . Left rotator cuff tear 01/14/2012   Supraspinatus tear noted on ultrasound. Provided instructions regarding nitroglycerin patches 1/4 patch daily to shoulder.   To continue circular motion exercises and range of motion. No over the head exercises.  No weight bearing on that side. She can continue to play tennis as tolerated.  FU in 2 weeks if no  improvement, 4 weeks if improved.      . Shoulder pain, left 12/31/2011   History of rotator cuff tear on the right and now has nontraumatic left shoulder pain   . Syncope    June, 2013  . Uterine prolapse     Past Surgical History:  Procedure Laterality Date  . APPENDECTOMY  1968  . BREAST BIOPSY  1998  . MOHS SURGERY     for squamous cell ca left nose  . OVARIAN CYST SURGERY  1968  . ROTATOR CUFF REPAIR  2001  . TONSILLECTOMY AND ADENOIDECTOMY    . TUBAL LIGATION      MEDS:   Current Outpatient Medications on File Prior to Visit  Medication Sig Dispense Refill  . estradiol (ESTRACE) 0.1 MG/GM vaginal cream 1 gram vaginally twice weekly 42.5 g 1   No current facility-administered medications on file prior to visit.     ALLERGIES: Patient has no known allergies.  Family History  Problem Relation Age of Onset  . Stroke Father        age 22  . Other Mother        old age died 51   . Deep vein thrombosis Mother     SH:  Married, non smoker  Review of Systems  Genitourinary: Positive for dysuria and frequency.  All other systems reviewed and are  negative.   PHYSICAL EXAMINATION:    BP 102/70 (BP Location: Right Arm, Patient Position: Sitting, Cuff Size: Normal)   Pulse 72   Resp 16   Ht 5' 4.25" (1.632 m)   Wt 139 lb 6.4 oz (63.2 kg)   LMP 10/28/1995   BMI 23.74 kg/m     General appearance: alert, cooperative and appears stated age Abdomen: soft, non-tender and no suprapubic tendernss; bowel sounds normal; no masses,  no organomegaly Lymph:  no inguinal LAD noted  Pelvic: External genitalia:  no lesions              Urethra:  normal appearing urethra with no masses, tenderness or lesions              Bartholins and Skenes: normal                 Vagina: normal appearing vaginal mucosa, some atrophic changes are present, scant watery discharge noted, no lesions, uterine prolapse with rectocele noted              Cervix: no lesions              Bimanual Exam:   Uterus:  normal size, contour, position, consistency, mobility, non-tender              Adnexa: no mass, fullness, tenderness  Chaperone was present for exam.  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  Bactrim DS bid x 3 days sent to pharmacy Affirm pending Pt can continue using pessary at this time.

## 2018-07-16 ENCOUNTER — Encounter: Payer: Self-pay | Admitting: *Deleted

## 2018-07-16 LAB — VAGINITIS/VAGINOSIS, DNA PROBE
Candida Species: NEGATIVE
Gardnerella vaginalis: NEGATIVE
Trichomonas vaginosis: NEGATIVE

## 2018-07-16 LAB — URINALYSIS, MICROSCOPIC ONLY: CASTS: NONE SEEN /LPF

## 2018-07-17 LAB — URINE CULTURE

## 2018-08-03 ENCOUNTER — Ambulatory Visit: Payer: Medicare Other | Admitting: Sports Medicine

## 2018-08-30 ENCOUNTER — Ambulatory Visit: Payer: Self-pay

## 2018-08-30 NOTE — Telephone Encounter (Signed)
Will send to Dr Regis Bill as Juluis Rainier Pt has appt scheduled 08/31/18 Nothing further needed.

## 2018-08-30 NOTE — Telephone Encounter (Signed)
-----   Message -----  From: Sammuel Bailiff  Sent: 08/30/2018  8:28 AM EST  To: Madelyn Brunner Pool  Subject: Appointment scheduled from [facility] For: Sammuel Bailiff ([id number])  Visit Type: MYCHART OFFICE VISIT (1594)    08/31/2018  1:15 PM 30 mins. Panosh, Standley Brooking, MD    [facility]    Patient Comments:  Widespread continous muscle pain, and weakness. Also shortness of breathe. Pins and Needles in arms through the night and often numbness in fingers on waking.     Attempted to call pt. to discuss her symptoms noted above.  Left message to call office and request to talk to a Triage Nurse.

## 2018-08-30 NOTE — Telephone Encounter (Signed)
FYI - Patient has an appointment scheduled 08/31/18.

## 2018-08-30 NOTE — Telephone Encounter (Signed)
Pt. Reports she has had muscle pain and weakness since this past August.States mainly her arms and legs. States it has gotten progressively worse, and now she even had some difficulty getting out of the tub this morning. No fever. Left shoulder hurts as well.Has seen a chiropractor "for stretching, which helps for a little while." Reports "sometimes my fingers feel tingly." Made an appointment for tomorrow through My Chart. States "I'm perfectly fine with that." Will send triage note to [facility] for review. Reason for Disposition . [1] MODERATE pain (e.g., interferes with normal activities) AND [2] present > 3 days  Answer Assessment - Initial Assessment Questions 1. ONSET: "When did the muscle aches or body pains start?"      August 2. LOCATION: "What part of your body is hurting?" (e.g., entire body, arms, legs)      Arms and legs 3. SEVERITY: "How bad is the pain?" (Scale 1-10; or mild, moderate, severe)   - MILD (1-3): doesn't interfere with normal activities    - MODERATE (4-7): interferes with normal activities or awakens from sleep    - SEVERE (8-10):  excruciating pain, unable to do any normal activities      Moderate 4. CAUSE: "What do you think is causing the pains?"     Unsure 5. FEVER: "Have you been having fever?"     No 6. OTHER SYMPTOMS: "Do you have any other symptoms?" (e.g., chest pain, weakness, rash, cold or flu symptoms, weight loss)     No 7. PREGNANCY: "Is there any chance you are pregnant?" "When was your last menstrual period?"     No 8. TRAVEL: "Have you traveled out of the country in the last month?" (e.g., travel history, exposures)     No  Protocols used: MUSCLE ACHES AND BODY PAIN-A-AH

## 2018-08-31 ENCOUNTER — Ambulatory Visit (INDEPENDENT_AMBULATORY_CARE_PROVIDER_SITE_OTHER): Payer: Medicare Other | Admitting: Internal Medicine

## 2018-08-31 ENCOUNTER — Encounter: Payer: Self-pay | Admitting: Internal Medicine

## 2018-08-31 VITALS — BP 108/70 | HR 64 | Temp 97.9°F | Wt 142.1 lb

## 2018-08-31 DIAGNOSIS — R200 Anesthesia of skin: Secondary | ICD-10-CM

## 2018-08-31 DIAGNOSIS — M791 Myalgia, unspecified site: Secondary | ICD-10-CM

## 2018-08-31 DIAGNOSIS — R7989 Other specified abnormal findings of blood chemistry: Secondary | ICD-10-CM | POA: Diagnosis not present

## 2018-08-31 LAB — TSH: TSH: 4.18 u[IU]/mL (ref 0.35–4.50)

## 2018-08-31 LAB — SEDIMENTATION RATE: Sed Rate: 18 mm/hr (ref 0–30)

## 2018-08-31 LAB — T4, FREE: FREE T4: 0.85 ng/dL (ref 0.60–1.60)

## 2018-08-31 LAB — T3, FREE: T3, Free: 3.5 pg/mL (ref 2.3–4.2)

## 2018-08-31 NOTE — Patient Instructions (Signed)
Will notify you  of labs when available.   If not revealing I plan on referral  To either neuro or rheumatology .

## 2018-08-31 NOTE — Progress Notes (Signed)
Chief Complaint  Patient presents with  . Follow-up    Continuous muscle pain, weakness and fatigue x 2month+.Pt states that she wakes up with the feeling of "pins and needles" in the middle of the night and mornings, some numbness and tingling in fingers.     HPI: Laura [id number].o. come in for worsening of sx  . Legs thighs  heavy in in bed and unsteady   Proximal muscles and legs so sore    Proximal weakness  ? Thighs   awakens  Pins and needs lin hands  Better with position.  Feet.  No numbness  ? Inc  Pain  ? Next day after   Activity?   Legs and  thighs feel weak and hard to maintain  Position .." feels like got old over night"  Began in about august and   Strength  not as good  Since -  Played tennis  90 minutes and worked through the pain and felt some better later.  But then worse the next day  Seeing  chiro and stretching  .  Not helping  No rash no joint effusion left shoulder  Still  Right thumb and hands numb at night   Goes away with position  change but awakens her.  ROS: See pertinent positives and negatives per HPI. No fever  Falling  respnew gi sx has a sensitive stomach ( not a change)   Past Medical History:  Diagnosis Date  . Biceps tendinopathy 01/14/2012  . 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 Hearing aid worn   . Hepatitis A   . Hip pain, right 12/31/2011   A mild amount of arthritis is probable with the limitation of rotation but the key finding today was weakness in abduction   . 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  .Marland KitchenHx of varicella   . HX: benign breast biopsy   . Left rotator cuff tear 01/14/2012   Supraspinatus tear noted on ultrasound. Provided instructions regarding nitroglycerin patches 1/4 patch daily to shoulder.   To continue circular motion exercises and range of motion. No over the head exercises.  No weight bearing on that side. She can continue to play tennis as tolerated.  FU in 2 weeks if no improvement, 4 weeks if  improved.      . Shoulder pain, left 12/31/2011   History of rotator cuff tear on the right and now has nontraumatic left shoulder pain   . Syncope    June, 2013  . Uterine prolapse     Family History  Problem Relation Age of Onset  . Stroke Father        age 74 . Other Mother        old age died 74  . Deep vein thrombosis Mother     Social History   Socioeconomic History  . Marital status: Married    Spouse name: Not on file  . Number of children: Not on file  . Years of education: Not on file  . Highest education level: Not on file  Occupational History  . Not on file  Social Needs  . Financial resource strain: Not on file  . Food insecurity:    Worry: Not on file    Inability: Not on file  . Transportation needs:    Medical: Not on file    Non-medical: Not on file  Tobacco Use  . Smoking  status: Never Smoker  . Smokeless tobacco: Never Used  Substance and Sexual Activity  . Alcohol use: Yes    Alcohol/week: 2.0 - 3.0 standard drinks    Types: 2 - 3 Standard drinks or equivalent per week    Comment: occ  . Drug use: No  . Sexual activity: Not Currently    Birth control/protection: None, Surgical    Comment: BTL  Lifestyle  . Physical activity:    Days per week: Not on file    Minutes per session: Not on file  . Stress: Not on file  Relationships  . Social connections:    Talks on phone: Not on file    Gets together: Not on file    Attends religious service: Not on file    Active member of club or organization: Not on file    Attends meetings of clubs or organizations: Not on file    Relationship status: Not on file  Other Topics Concern  . 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 Sees dentist on a regular basis.    etoh 3 x per week                  Allergies as of 08/31/2018   No Known Allergies     Medication List        Accurate as of 08/31/18  5:25 PM. Always use your most recent med list.          estradiol 0.1 MG/GM vaginal cream Commonly known as:  ESTRACE 1 gram vaginally twice weekly         EXAM:  BP 108/70 (BP Location: Right Arm, Patient Position: Sitting, Cuff Size: Normal)   Pulse 64   Temp 97.9 F (36.6 C) (Oral)   Wt 142 lb 1.6 oz (64.5 kg)   LMP 10/28/1995   BMI 24.20 kg/m   Body mass index is 24.2 kg/m.  GENERAL: vitals reviewed and listed above, alert, oriented, appears well hydrated and in no acute distress HEENT: atraumatic, conjunctiva  clear, no obvious abnormalities on inspection of external nose and ears  NECK: no obvious masses on inspection palpation  LUNGS: clear to auscultation bilaterally, no wheezes, rales or rhonchi, good air movement CV: HRRR, no clubbing cyanosis or  peripheral edema nl cap refill  Abdomen:  Sof,t normal bowel sounds without hepatosplenomegaly, no guarding rebound or masses no CVA tenderness  MS: moves all extremities without noticeable focal  Abnormality nl gait   dtrs present   Mild djd chnages hand wrist  PSYCH: pleasant and cooperative, no obvious depression or anxiety Neuro non focal  Lab Results  Component Value Date   WBC 7.3 07/14/2018   HGB 14.3 07/14/2018   HCT 41.7 07/14/2018   PLT 245.0 07/14/2018   GLUCOSE 91 07/14/2018   CHOL 222 (H) 07/14/2018   TRIG 88.0 07/14/2018   HDL 64.80 07/14/2018   LDLDIRECT 138.7 07/27/2012   LDLCALC 139 (H) 07/14/2018   ALT 18 07/14/2018   AST 19 07/14/2018   NA 137 07/14/2018   K 4.0 07/14/2018   CL 101 07/14/2018   CREATININE 0.65 07/14/2018   BUN 16 07/14/2018   CO2 31 07/14/2018   TSH 4.18  08/31/2018   BP Readings from Last 3 Encounters:  08/31/18 108/70  07/15/18 102/70  07/14/18 102/66    ASSESSMENT AND PLAN:  Discussed the following assessment and plan:  Muscle pain - ? decrease  strength  proximal legs ?  left shoulder  lom and difficulty .  - Plan: TSH, T4, free, T3, free, Thyroid antibodies, ANA, Aldolase, Sedimentation rate  TSH elevation - Plan: TSH, T4, free, T3, free, Thyroid antibodies, ANA, Aldolase  Hand numbness - positional at night  - Plan: TSH, T4, free, T3, free, Thyroid antibodies, ANA, Aldolase, Sedimentation rate   If thyroid is way off poss  Can cause sx but  Unexpected to do this   Plan referral help after  Repeat labs today   Expectant management.    ? pmr but  Blood parameters esr crp have been normal  Patient advised to return or notify health care team  if  new concerns arise.  Patient Instructions  Will notify you  of labs when available.   If not revealing I plan on referral  To either neuro or rheumatology .   Standley Brooking. Panosh M.D.

## 2018-09-02 LAB — ANA: ANA: NEGATIVE

## 2018-09-02 LAB — THYROID ANTIBODIES: THYROID PEROXIDASE ANTIBODY: 1 [IU]/mL (ref <9)

## 2018-09-02 LAB — ALDOLASE: Aldolase: 4.1 U/L (ref <=8.1)

## 2018-09-03 ENCOUNTER — Other Ambulatory Visit: Payer: Self-pay | Admitting: Internal Medicine

## 2018-09-03 ENCOUNTER — Encounter: Payer: Self-pay | Admitting: *Deleted

## 2018-09-03 DIAGNOSIS — M791 Myalgia, unspecified site: Secondary | ICD-10-CM

## 2018-09-03 DIAGNOSIS — R531 Weakness: Secondary | ICD-10-CM

## 2018-09-03 DIAGNOSIS — R202 Paresthesia of skin: Secondary | ICD-10-CM

## 2018-10-18 ENCOUNTER — Encounter: Payer: Self-pay | Admitting: Family Medicine

## 2018-10-18 ENCOUNTER — Ambulatory Visit (INDEPENDENT_AMBULATORY_CARE_PROVIDER_SITE_OTHER): Payer: Medicare Other | Admitting: Family Medicine

## 2018-10-18 VITALS — BP 124/78 | HR 70 | Temp 98.2°F | Resp 12 | Ht 64.25 in | Wt 142.5 lb

## 2018-10-18 DIAGNOSIS — J320 Chronic maxillary sinusitis: Secondary | ICD-10-CM

## 2018-10-18 DIAGNOSIS — R0981 Nasal congestion: Secondary | ICD-10-CM | POA: Diagnosis not present

## 2018-10-18 MED ORDER — DOXYCYCLINE HYCLATE 100 MG PO TABS: 100.0000 mg | ORAL_TABLET | Freq: Two times a day (BID) | ORAL | 0 refills | Status: AC

## 2018-10-18 MED ORDER — FLUTICASONE PROPIONATE 50 MCG/ACT NA SUSP: 1.0000 | Freq: Two times a day (BID) | NASAL | 0 refills | Status: DC

## 2018-10-18 MED ORDER — PREDNISONE 20 MG PO TABS: 40.0000 mg | ORAL_TABLET | Freq: Every day | ORAL | 0 refills | Status: AC

## 2018-10-18 NOTE — Patient Instructions (Signed)
A few things to remember from today's visit:   Nasal sinus congestion - Plan: predniSONE (DELTASONE) 20 MG tablet, fluticasone (FLONASE) 50 MCG/ACT nasal spray  Left maxillary sinusitis - Plan: doxycycline (VIBRA-TABS) 100 MG tablet I do not think antibiotic is needed at this time, I sent antibiotic prescription to the pharmacy to start if you are not any better in 3 to 4 days, if symptoms get worse, or if you develop fever. Nasal saline irrigation a few times during the day. Pop your ears a few times during the day, this will help with ear fullness sensation.  Monitor for fever. Take prednisone with breakfast. After you finish prednisone you can continue with Flonase daily for 3 to 4 weeks.

## 2018-10-18 NOTE — Progress Notes (Signed)
ACUTE VISIT  HPI:  Chief Complaint  Patient presents with  . Cough    sx started 2 weeks ago, yellow/green mucus  . Nasal Congestion    Laura Curtis is a 72 y.o.female here today complaining of 2 weeks of intermittent nasal congestion, rhinorrhea with yellowish drainage, postnasal drainage, and productive cough with yellow/greenish sputum. She denies hemoptysis, dyspnea, wheezing, or chest pain. She has not identified any exacerbating or alleviating factors. Sore throat, usually in the morning.  Bilateral ear fullness sensation, episodes of "popping" ears. History of hearing loss, it does not seem to be worse than usual. She wears hearing aids.  She thinks she may have a sinus infection.  Cough  This is a new problem. The current episode started 1 to 4 weeks ago. Associated symptoms include chills, postnasal drip and a sore throat. Pertinent negatives include no ear pain, eye redness, fever, headaches, myalgias, rash, shortness of breath or wheezing. She has tried nothing for the symptoms. There is no history of COPD or environmental allergies.     No Hx of recent travel. Her husband was sick with similar symptoms, he has recovered. No known insect bite.  Hx of allergies: Denies.  OTC medications for this problem: No medications, she has tried [employer] and steam.  Review of Systems  Constitutional: Positive for chills and fatigue. Negative for activity change, appetite change and fever.  HENT: Positive for congestion, hearing loss, postnasal drip, sinus pressure and sore throat. Negative for ear pain, mouth sores, trouble swallowing and voice change.   Eyes: Negative for discharge and redness.  Respiratory: Positive for cough. Negative for shortness of breath and wheezing.   Gastrointestinal: Negative for abdominal pain, nausea and vomiting.  Musculoskeletal: Negative for myalgias and neck pain.  Skin: Negative for rash.  Allergic/Immunologic: Negative for  environmental allergies.  Neurological: Negative for weakness and headaches.    Current Outpatient Medications on File Prior to Visit  Medication Sig Dispense Refill  . estradiol (ESTRACE) 0.1 MG/GM vaginal cream 1 gram vaginally twice weekly 42.5 g 1   No current facility-administered medications on file prior to visit.      Past Medical History:  Diagnosis Date  . Biceps tendinopathy 01/14/2012  . 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 Hearing aid worn   . Hepatitis A   . Hip pain, right 12/31/2011   A mild amount of arthritis is probable with the limitation of rotation but the key finding today was weakness in abduction   . 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  [name] Hx of varicella   . HX: benign breast biopsy   . Left rotator cuff tear 01/14/2012   Supraspinatus tear noted on ultrasound. Provided instructions regarding nitroglycerin patches 1/4 patch daily to shoulder.   To continue circular motion exercises and range of motion. No over the head exercises.  No weight bearing on that side. She can continue to play tennis as tolerated.  FU in 2 weeks if no improvement, 4 weeks if improved.      . Shoulder pain, left 12/31/2011   History of rotator cuff tear on the right and now has nontraumatic left shoulder pain   . Syncope    June, 2013  . Uterine prolapse    No Known Allergies  Social History   Socioeconomic History  . Marital status: Married    Spouse name: Not on file  . Number  of children: Not on file  . Years of education: Not on file  . Highest education level: Not on file  Occupational History  . Not on file  Social Needs  . Financial resource strain: Not on file  . Food insecurity:    Worry: Not on file    Inability: Not on file  . Transportation needs:    Medical: Not on file    Non-medical: Not on file  Tobacco Use  . Smoking status: Never Smoker  . Smokeless tobacco: Never Used  Substance and Sexual Activity  . Alcohol use: Yes     Alcohol/week: 2.0 - 3.0 standard drinks    Types: 2 - 3 Standard drinks or equivalent per week    Comment: occ  . Drug use: No  . Sexual activity: Not Currently    Birth control/protection: None, Surgical    Comment: BTL  Lifestyle  . Physical activity:    Days per week: Not on file    Minutes per session: Not on file  . Stress: Not on file  Relationships  . Social connections:    Talks on phone: Not on file    Gets together: Not on file    Attends religious service: Not on file    Active member of club or organization: Not on file    Attends meetings of clubs or organizations: Not on file    Relationship status: Not on file  Other Topics Concern  . 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 Sees dentist on a regular basis.    etoh 3 x per week                 Vitals:   10/18/18 1458  BP: 124/78  Pulse: 70  Resp: 12  Temp: 98.2 F (36.8 C)  SpO2: 95%   Body mass index is 24.27 kg/m.  Physical Exam  Nursing note and vitals reviewed. Constitutional: She is oriented to person, place, and time. She appears well-developed and well-nourished. She does not appear ill. No distress.  HENT:  Head: Normocephalic and atraumatic.  Right Ear: Tympanic membrane, external ear and ear canal normal.  Left Ear: Tympanic membrane, external ear and ear canal normal.  Nose: Right sinus exhibits no maxillary sinus tenderness and no frontal sinus tenderness. Left sinus exhibits maxillary sinus tenderness. Left sinus exhibits no frontal sinus tenderness.  Mouth/Throat: Oropharynx is clear and moist and mucous membranes are normal.  Mild pain upon pressing left maxillary sinus.  Eyes: Conjunctivae are normal.  Cardiovascular: Normal rate and regular rhythm.  No murmur heard. Respiratory: Effort  normal and breath sounds normal. No respiratory distress.  Lymphadenopathy:       Head (right side): No submandibular adenopathy present.       Head (left side): No submandibular adenopathy present.    She has no cervical adenopathy.  Neurological: She is alert and oriented to person, place, and time. She has normal strength. No cranial nerve deficit.  Skin: Skin is warm. No rash noted. No erythema.  Psychiatric: She has a normal mood and affect.  Well groomed, good eye contact.    ASSESSMENT AND PLAN:  Ms. Myda was seen today for cough and  nasal congestion.  Diagnoses and all orders for this visit:  Nasal sinus congestion Nasal saline irrigations a few times during the day. Auto inflation maneuvers may help with ear discomfort. After discussion of some side effects, she would like to try short course of Prednisone. F/U with PCP as needed.   -     predniSONE (DELTASONE) 20 MG tablet; Take 2 tablets (40 mg total) by mouth daily with breakfast for 3 days. -     fluticasone (FLONASE) 50 MCG/ACT nasal spray; Place 1 spray into both nostrils 2 (two) times daily.  Left maxillary sinusitis Mild maxillary tenderness, which could be related to congestion rather than bacterial process. I do not think antibiotic treatment is needed at this time. Because coming holidays (clinic will be closed) I sent antibiotic to her pharmacy. Recommend starting abx if she does not feel any better in about 3 days or if she feels worse or if she develops fever. After discussion of some signs of prednisone, she would like to try   -     doxycycline (VIBRA-TABS) 100 MG tablet; Take 1 tablet (100 mg total) by mouth 2 (two) times daily for 7 days.   .   Return if symptoms worsen or fail to improve.      Betty G. Martinique, MD  [facility]. [place] office.

## 2018-11-10 ENCOUNTER — Telehealth: Payer: Self-pay | Admitting: Obstetrics & Gynecology

## 2018-11-10 NOTE — Telephone Encounter (Signed)
Reviewed with nursing supervisor. Unable to reach patient. Patient is scheduled for OV on 1/16 with Dr. Sabra Heck.   Routing to Dr. Sabra Heck.   Encounter closed.

## 2018-11-10 NOTE — Telephone Encounter (Signed)
Left message to call Sharee Pimple, RN at [place].   May speak with Gay Filler if I am not available.

## 2018-11-10 NOTE — Telephone Encounter (Signed)
Patient returned call, call dropped x2. Returned call, goes straight to voicemail.   Left message to call Sharee Pimple, RN at [facility]. If Sharee Pimple, not available, may speak with Gay Filler, Therapist, sports.

## 2018-11-10 NOTE — Telephone Encounter (Signed)
Patient unavailable, spoke with patients spouse, left message to return call to [place], RN at [facility] to further assess symptoms.

## 2018-11-10 NOTE — Telephone Encounter (Signed)
Patient returned call and scheduled an appointment with Dr. Sabra Heck for 1:30 PM tomorrow. Routing to nurse for further triage.

## 2018-11-10 NOTE — Telephone Encounter (Signed)
Patient is experiencing urgency and discomfort when emptying her bladder. Patient stated that she was on antibiotics about 2 weeks ago and is suspecting that it is a yeast infection. Patient stated that she is currently in [facility] and could be in [place] by tomorrow afternoon. Patient declined appointment tomorrow at 1:45PM and stated that it would be later in the day before she could get here.

## 2018-11-11 ENCOUNTER — Telehealth: Payer: Self-pay | Admitting: Obstetrics & Gynecology

## 2018-11-11 ENCOUNTER — Ambulatory Visit: Payer: Medicare Other | Admitting: Obstetrics & Gynecology

## 2018-11-11 VITALS — BP 130/68 | HR 66 | Temp 97.9°F | Resp 14 | Ht 64.25 in | Wt 142.8 lb

## 2018-11-11 DIAGNOSIS — R300 Dysuria: Secondary | ICD-10-CM | POA: Diagnosis not present

## 2018-11-11 LAB — POCT URINALYSIS DIPSTICK
Bilirubin, UA: NEGATIVE
Glucose, UA: NEGATIVE
Ketones, UA: NEGATIVE
Nitrite, UA: NEGATIVE
Protein, UA: NEGATIVE
UROBILINOGEN UA: NEGATIVE U/dL — AB
pH, UA: 5 (ref 5.0–8.0)

## 2018-11-11 MED ORDER — SULFAMETHOXAZOLE-TRIMETHOPRIM 800-160 MG PO TABS: 1.0000 | ORAL_TABLET | Freq: Two times a day (BID) | ORAL | 0 refills | Status: DC

## 2018-11-11 NOTE — Telephone Encounter (Signed)
Spoke with patient. Patient reports urgency and discomfort when urinating and sitting. Increased sweating during the night, did not check temp. Sleeping more. Started drinking cranberry juice yesterday, feels this is helping. Denies vaginal d/c, N/V, vaginal bleeding. Was treated 2 wks ago for "virus" with abx. Patient is currently traveling back from [facility]. See telephone encounter dated 1/15, patient is scheduled for OV today at 1:30pm with Dr. Sabra Heck. Advised to keep OV as scheduled for further evaluation with Dr. Sabra Heck. Should symptoms worsen or new symptoms develop, seek immediate evaluation at ER/Urgent Care.  Routing to provider for final review. Patient is agreeable to disposition. Will close encounter.

## 2018-11-11 NOTE — Progress Notes (Signed)
GYNECOLOGY  VISIT  CC:   Dysuria   HPI: 73 y.o. [id number] [name] female here for concerns about possible vaginitis and dysuria.  She went to [facility] on Monday as the weather this week was going to be so nice.  She was in the care for five hours and started having some mild urinary frequency and dysuria when she was traveling.  Symptoms have gradually worsened and she now has urgency as well.    Denies back pain, pelvic pain.  She's has had some sweats and feels chilled.  She's had increased fatigue and feels like she could sleep "all the time".  Denies hematuria and vaginal discharge as well.  The reasons she thought this could be vaginitis is she took doxycycline x 7 days at the end of December and into early January.  Wonders if she could have yeast as well.  Denies vaginal bleeding.  GYNECOLOGIC HISTORY: Patient's last menstrual period was 10/28/1995. Contraception: Tubal ligation  Menopausal hormone therapy: Yes estrace   Patient Active Problem List   Diagnosis Date Noted  . Degenerative tear of medial meniscus of right knee 04/01/2016  . Hearing aid worn   . Greater trochanteric bursitis of right hip 04/15/2013  . Internal hordeolum 01/11/2013  . Hx of syncope 07/21/2012  . Counseling on health promotion and disease prevention 07/21/2012  . Hx of nonmelanoma skin cancer 07/21/2012  . History of positive PPD   . HX: benign breast biopsy   . Biceps tendinopathy 01/14/2012  . Hip pain, right 12/31/2011    Past Medical History:  Diagnosis Date  . Biceps tendinopathy 01/14/2012  . 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 Hearing aid worn   . Hepatitis A   . Hip pain, right 12/31/2011   A mild amount of arthritis is probable with the limitation of rotation but the key finding today was weakness in abduction   . 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  [name] Hx of varicella   . HX: benign breast biopsy   . Left rotator cuff tear 01/14/2012   Supraspinatus tear noted on ultrasound. Provided instructions regarding nitroglycerin patches 1/4 patch daily to shoulder.   To continue circular motion exercises and range of motion. No over the head exercises.  No weight bearing on that side. She can continue to play tennis as tolerated.  FU in 2 weeks if no improvement, 4 weeks if improved.      . Shoulder pain, left 12/31/2011   History of rotator cuff tear on the right and now has nontraumatic left shoulder pain   . Syncope    June, 2013  . Uterine prolapse     Past Surgical History:  Procedure Laterality Date  . APPENDECTOMY  1968  . BREAST BIOPSY  1998  . MOHS SURGERY     for squamous cell ca left nose  . OVARIAN CYST SURGERY  1968  . ROTATOR CUFF REPAIR  2001  . TONSILLECTOMY AND ADENOIDECTOMY    . TUBAL LIGATION      MEDS:   Current Outpatient Medications on File Prior to Visit  Medication Sig Dispense Refill  . estradiol (ESTRACE) 0.1 MG/GM vaginal cream 1 gram vaginally twice weekly 42.5 g 1  . fluticasone (FLONASE) 50 MCG/ACT nasal spray Place 1 spray into both nostrils 2 (two) times daily. 16 g 0   No current facility-administered medications on file prior to visit.     ALLERGIES:  Patient has no known allergies.  Family History  Problem Relation Age of Onset  . Stroke Father        age 62  . Other Mother        old age died 73   . Deep vein thrombosis Mother     SH:  Married, non smoker  Review of Systems  Genitourinary: Positive for dysuria, frequency and urgency.    PHYSICAL EXAMINATION:    BP 130/68   Pulse 66   Temp 97.9 F (36.6 C)   Resp 14   Ht 5' 4.25" (1.632 m)   Wt 142 lb 12.8 oz (64.8 kg)   LMP 10/28/1995   BMI 24.32 kg/m     General appearance: alert, cooperative and appears stated age Abdomen: soft, mild suprapubic tenderness; bowel sounds normal; no masses,  no organomegaly Flank:  No CVA tenderness Lymph:  no inguinal LAD noted  Pelvic: External genitalia:  no lesions               Urethra:  normal appearing urethra with no masses, tenderness or lesions              Bartholins and Skenes: normal                 Vagina: normal appearing vagina with normal color and discharge, no lesions              Cervix: no lesions              Bimanual Exam:  Uterus:  normal size, contour, position, consistency, mobility, non-tender              Adnexa: no mass, fullness, tenderness  Chaperone was present for exam.  Assessment: Dysuria, urgency, frequency concerning for UTI Recent antibiotic use  Plan: Urine culture pending Will treat with Bactrim DS BID x 3 days. Results will be called to pt

## 2018-11-11 NOTE — Patient Instructions (Signed)
AZO Standard

## 2018-11-11 NOTE — Telephone Encounter (Signed)
Patient returning [place] call from 11/10/18. No open phone note. Patient will be in for an appointment with Dr. Sabra Heck this afternoon.

## 2018-11-13 ENCOUNTER — Encounter: Payer: Self-pay | Admitting: Obstetrics & Gynecology

## 2018-11-14 LAB — URINE CULTURE

## 2018-12-07 ENCOUNTER — Other Ambulatory Visit: Payer: Medicare Other

## 2018-12-07 ENCOUNTER — Ambulatory Visit: Payer: Medicare Other | Admitting: Sports Medicine

## 2018-12-07 ENCOUNTER — Encounter

## 2018-12-07 VITALS — BP 108/68 | Ht 64.5 in | Wt 140.0 lb

## 2018-12-07 DIAGNOSIS — M6281 Muscle weakness (generalized): Secondary | ICD-10-CM

## 2018-12-07 NOTE — Patient Instructions (Signed)
Thank you for coming to see [place] today in clinic.  We believe that your arm pain is secondary to inflammatory process.  We are can obtain some lab work today to further evaluate this.  In the meantime, we recommend you try an anti-inflammatory diet.  This entails eating 5 servings of either spinach, [place], kale, collard, or beets a day.  We also recommended she try tart cherry juice and pomegranate for 2 servings a day with anti-oxygen effect.  We will see back following lab results.

## 2018-12-07 NOTE — Progress Notes (Signed)
   HPI  CC: Bilateral upper extremity pain  Laura Curtis is a 73 year old female presents for bilateral upper extremity pain.  She states the pain started last September.  At that time she was worked up by her primary care doctor, who obtained inflammatory markers.  These were all found to be negative.  She states the pain is in her deltoid muscles.  She states the pain is worse when she first wakes up in the morning.  She states the last around 1 hour, then gets better when she starts to work out.  She states the pain is worse when she activates her deltoids overhead.  She has no weakness with overhead activity.  She she does report some nighttime pain.  She has been doing yoga in Silver sneakers, which she states helps somewhat.  She is also been rubbing CBD over the area, which she states helped somewhat.  She has been playing tennis, which she states does not bother it.  She does report occasional pins-and-needles that her hand in a nondescript pattern.  She states is been present for greater than 10 years however.  She states it is positional and goes away if she changes her position.  She denies any trauma to the area.  She denies any history of fevers chills or night sweats.  She has no remote history of cancer.  Past Injuries: None Past Surgeries: None Smoking: Denies Family Hx: Noncontributory  ROS: Per HPI; in addition no fever, no rash, no additional weakness, no additional numbness, no additional paresthesias, and no additional falls/injury.   All past medical history, medications, and allergies reviewed myself at today's visit.  Objective: BP 108/68   Ht 5' 4.5" (1.638 m)   Wt 140 lb (63.5 kg)   LMP 10/28/1995   BMI 23.66 kg/m  Gen: Right-Hand Dominant. NAD, well groomed, a/o x3, normal affect.  CV: Well-perfused. Warm.  Resp: Non-labored.  Neuro: Sensation intact throughout. No gross coordination deficits.  Gait: Nonpathologic posture, unremarkable stride without signs of limp or  balance issues.  Bilateral arm exam: No erythema, warmth, swelling noted.  Tenderness palpation over the deltoid muscle bilateral shoulders.  Tenderness palpation of the [facility] joint of the left shoulder.  Full range of motion of forward flexion, abduction, internal rotation, external rotation.  Strength 5 5 throughout testing.  Negative Hocking's test, negative empty can test, negative speeds test, negative Yergason's test, negative crossover test, negative belly press off test.  Neck exam: No erythema, warmth, swelling noted.  No tenderness palpation on exam.  Full range of motion of forward flexion, extension, side to side motion of the neck.  Strength out of 5 throughout cervical spine testing.  Negative Spurling's test bilaterally.  Assessment and Plan: Bilateral arm weakness and pain.  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  We discussed treatment options at today's visit.  At this time I wouild like to repeat an ESR, CRP, and CK level.  We did go and recommended she start on a anti-inflammatory diet.  This entails eating foods high nitric oxide's for 5 servings a day.  This includes kale, arugla, spinach, collards, and beets.  Pending the results of these labs will dictate our treatment choice moving forward  Lewanda Rife, MD [place] Fellow 12/07/2018 11:19 AM  I observed and examined the patient with the Dr. Doren Custard, [facility] fellow and agree with assessment and plan.  Note reviewed and modified by me. Ila Mcgill, MD

## 2018-12-08 LAB — CK: Total CK: 58 U/L (ref 24–173)

## 2018-12-08 LAB — SEDIMENTATION RATE: Sed Rate: 3 mm/hr (ref 0–40)

## 2018-12-08 LAB — C-REACTIVE PROTEIN: CRP: 2 mg/L (ref 0–10)

## 2018-12-17 ENCOUNTER — Other Ambulatory Visit: Payer: Self-pay | Admitting: *Deleted

## 2018-12-17 ENCOUNTER — Ambulatory Visit
Admission: RE | Admit: 2018-12-17 | Discharge: 2018-12-17 | Disposition: A | Discharge status: Home / Self Care | Payer: Medicare Other | Source: Ambulatory Visit | Attending: Sports Medicine | Admitting: Sports Medicine

## 2018-12-17 DIAGNOSIS — M25512 Pain in left shoulder: Secondary | ICD-10-CM | POA: Diagnosis not present

## 2018-12-17 DIAGNOSIS — M19011 Primary osteoarthritis, right shoulder: Secondary | ICD-10-CM | POA: Diagnosis not present

## 2018-12-17 DIAGNOSIS — M25511 Pain in right shoulder: Secondary | ICD-10-CM

## 2018-12-30 ENCOUNTER — Encounter: Payer: Self-pay | Admitting: Sports Medicine

## 2018-12-30 ENCOUNTER — Ambulatory Visit: Payer: Medicare Other | Admitting: Sports Medicine

## 2018-12-30 ENCOUNTER — Ambulatory Visit: Payer: Self-pay

## 2018-12-30 VITALS — BP 94/62 | Ht 64.5 in | Wt 137.0 lb

## 2018-12-30 DIAGNOSIS — M25511 Pain in right shoulder: Secondary | ICD-10-CM

## 2018-12-30 DIAGNOSIS — M25512 Pain in left shoulder: Secondary | ICD-10-CM

## 2018-12-30 MED ORDER — NITROGLYCERIN 0.2 MG/HR TD PT24: MEDICATED_PATCH | TRANSDERMAL | 1 refills | Status: DC

## 2018-12-30 NOTE — Progress Notes (Signed)
Laura Curtis - 73 y.o. female MRN [id number]  Date of birth: [birth year]    SUBJECTIVE:      Chief Complaint: Bilateral shoulder pain  HPI:  73 year old female with bilateral shoulder pain since September 2019.  Left is worse than right.  She localizes her pain around the lateral deltoid and midshaft humerus bilaterally.  She also notes some pain in the left axilla at times.  Today she recalls that around the time of injury, she will lifting/moving head boards that were fairly heavy.  Today, she notes that her pain is very minimal as long as her arms are at her side or below 90 degrees.  She gets increased pain primarily with abduction.  Her pain is also worse in the morning.  Some days, however, she is able to play tennis without severe pain, she limits overhead movement.  She denies any swelling or erythema.  No numbness or tingling distally   Previously she has undergone laboratory evaluation today for systemic inflammatory conditions.  She is also had bilateral shoulder x-rays performed.  Lab work has been unremarkable.  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  Left shoulder x-rays were unremarkable.  She was seen by a chiropractor which was not helpful.  She is not done physical therapy.  She has taken Aleve with no second benefit.   ROS:     See HPI  PERTINENT  PMH / PSH FH / / SH:  Past Medical, Surgical, Social, and Family History Reviewed & Updated in the EMR.  Pertinent findings include:  Remote history of right RTC repair  OBJECTIVE: BP 94/62   Ht 5' 4.5" (1.638 m)   Wt 137 lb (62.1 kg)   LMP 10/28/1995   BMI 23.15 kg/m   Physical Exam:  Vital signs are reviewed.  GEN: Alert and oriented, NAD Pulm: Breathing unlabored PSY: normal mood, congruent affect  MSK: Left shoulder: No obvious deformity or asymmetry. No bruising. No swelling Mild tenderness over the bicipital groove.  No tenderness of the AC joint Full ROM in flexion, abduction,  internal/external rotation, however with painful arc in flexion from 90 to 180 degrees and in abduction from about 50 to 160 degrees NV intact distally Special Tests:  - Impingement: Positive Hawkins, negative Neers.  - Supraspinatus: Pain with empty can.  4+/5 strength - Infraspinatus/Teres: 5/5 strength with ER - Subscapularis: negative belly press. 4+/5 strength with IR - Biceps tendon: Mildly positive speeds.   MSK [place]: Ultrasound of left shoulder  BT short: normal appearance, no tears, no tenosynovitis BT long: normal appearance, intact without tears.  Slight cortical irregularity at the superior aspect of the bicipital groove. Supraspinatus tendon:  There appears to be a full-thickness tear of the supraspinatus around the middle of the tendon.  There is slight retraction of the fibers <1cm. Subscapularis tendon: normal appearance without tears or degenerative changes Infraspinatus tendon:  normal appearance without tears or degenerative changes Teres Minor tendon:  normal appearance without tears or degenerative changes GH joint: normal appearing posterior labrum AC joint: mild degenerative changes  Summary and Additional findings-full-thickness supraspinatus tear with some retraction  Ultrasound and interpretation by Coralyn Helling, DO and Wolfgang Phoenix. Fields, MD  Right shoulder: No obvious deformity or asymmetry. No bruising. No swelling No tenderness to palpation Full ROM in flexion, abduction, internal/external rotation, however with painful arc in flexion from 110 to 180 degrees and in abduction from about 50 to 160 degrees NV intact distally Special Tests:  -  Impingement: Mildly positive Hawkins, negative Neers.  - Supraspinatus: Negative empty can.  5/5 strength - Infraspinatus/Teres: 5/5 strength with ER - Subscapularis: negative belly press. 5/5 strength with IR - Biceps tendon: Mildly positive speeds.   MSK [place]: Limited ultrasound of right shoulder.  Partial thickness  intrasubstance supraspinatus tear with without retraction.  There is fairly more cortical regularity/arthritic changes to the humeral head in the right shoulder.  These cortical irregularities correspond with the area of tear  Ultrasound and interpretation by Peterson Ao B. Fields, MD     ASSESSMENT & PLAN:  1.  Bilateral shoulder pain-patient has apparent full-thickness tear of the supraspinatus in the left shoulder.  This is consistent with her left shoulder being significant more painful than the right.  There is small intrasubstance tear visualized within the supraspinatus on the RT.   She does have a history of rotator cuff repair on this side.  Is difficult to say if this changes are acute or chronic. -We will refer to physical therapy - Nitroglycerin patches primarily for the left shoulder.  If she tolerates them well she can use them on the right shoulder also. -Follow-up in 6 weeks for repeat ultrasound  I observed and examined the patient with the [facility] Fellow and agree with assessment and plan.  Note reviewed and modified by me. Ysidro Evert

## 2018-12-30 NOTE — Patient Instructions (Signed)
I believe your shoulder pain is due to to a rotator cuff tear Today we will refer you to physical therapy You may continue Aleve as needed Ice 15 minutes 3-4 times per day as needed We will start you on nitroglycerin protocol.  Follow the instructions below   Nitroglycerin Protocol   Apply 1/4 nitroglycerin patch to affected area daily.  Change position of patch within the affected area every 24 hours.  You may experience a headache during the first 1-2 weeks of using the patch, these should subside.  If you experience headaches after beginning nitroglycerin patch treatment, you may take your preferred over the counter pain reliever.  Another side effect of the nitroglycerin patch is skin irritation or rash related to patch adhesive.  Please notify our office if you develop more severe headaches or rash, and stop the patch.  Tendon healing with nitroglycerin patch may require 12 to 24 weeks depending on the extent of injury.  Men should not use if taking Viagra, Cialis, or Levitra.   Do not use if you have migraines or rosacea.

## 2019-01-12 ENCOUNTER — Ambulatory Visit: Payer: Medicare Other

## 2019-02-08 ENCOUNTER — Ambulatory Visit: Payer: Medicare Other | Admitting: Sports Medicine

## 2019-03-14 DIAGNOSIS — D485 Neoplasm of uncertain behavior of skin: Secondary | ICD-10-CM | POA: Diagnosis not present

## 2019-03-14 DIAGNOSIS — L570 Actinic keratosis: Secondary | ICD-10-CM | POA: Diagnosis not present

## 2019-03-14 DIAGNOSIS — D225 Melanocytic nevi of trunk: Secondary | ICD-10-CM | POA: Diagnosis not present

## 2019-04-05 ENCOUNTER — Ambulatory Visit: Payer: Self-pay

## 2019-04-05 ENCOUNTER — Ambulatory Visit
Admission: RE | Admit: 2019-04-05 | Discharge: 2019-04-05 | Disposition: A | Discharge status: Home / Self Care | Payer: Medicare Other | Source: Ambulatory Visit | Attending: Sports Medicine | Admitting: Sports Medicine

## 2019-04-05 ENCOUNTER — Ambulatory Visit (INDEPENDENT_AMBULATORY_CARE_PROVIDER_SITE_OTHER): Payer: Medicare Other | Admitting: Sports Medicine

## 2019-04-05 ENCOUNTER — Other Ambulatory Visit: Payer: Self-pay

## 2019-04-05 ENCOUNTER — Encounter: Payer: Self-pay | Admitting: Sports Medicine

## 2019-04-05 VITALS — BP 88/58 | Ht 64.5 in | Wt 137.0 lb

## 2019-04-05 DIAGNOSIS — M25561 Pain in right knee: Secondary | ICD-10-CM | POA: Diagnosis not present

## 2019-04-05 DIAGNOSIS — M25562 Pain in left knee: Secondary | ICD-10-CM

## 2019-04-05 DIAGNOSIS — M23203 Derangement of unspecified medial meniscus due to old tear or injury, right knee: Secondary | ICD-10-CM | POA: Diagnosis not present

## 2019-04-05 DIAGNOSIS — M1711 Unilateral primary osteoarthritis, right knee: Secondary | ICD-10-CM | POA: Diagnosis not present

## 2019-04-05 DIAGNOSIS — M1712 Unilateral primary osteoarthritis, left knee: Secondary | ICD-10-CM | POA: Diagnosis not present

## 2019-04-05 NOTE — Progress Notes (Signed)
Chief complaint right knee pain  Patient fell on her right knee with significant bruising in 2017 She was seen by me after that and had a degenerative meniscus noted on ultrasound along with some mild spurring She also had signs of what was possibly a traumatic Pez anserine bursitis That gradually improved with using a compression sleeve icing and a home exercise program  In 2018 she had another flare of right knee pain On this occasion there was not a clear injury but she had been doing a fair amount of gardening The evaluation at that time revealed some increase in the arthritic changes We tried using cushioned insoles, topical Aspercreme and some periodic ibuprofen  She has remained active since 2018 She does walk regularly at least 3 to 4 days a week and mixes and some other forms of exercise Now the knee is hurting worse at night It does respond to Aleve or ibuprofen She has not been wearing a compression sleeve recently She does feel like it is wanting to give out sometimes coming down steps  Review of systems Mild swelling on the right knee No swelling of the left knee Denies redness No hip pain No locking of either knee  Physical examination Pleasant older female in no acute distress, some mild difficulty hearing BP (!) 88/58   Ht 5' 4.5" (1.638 m)   Wt 137 lb (62.1 kg)   LMP 10/28/1995   BMI 23.15 kg/m   Left knee Knee: Normal to inspection with no erythema or effusion or obvious bony abnormalities. Palpation normal with no warmth or joint line tenderness or patellar tenderness or condyle tenderness. ROM normal in flexion and extension and lower leg rotation. Ligaments with solid consistent endpoints including ACL, PCL, LCL, MCL. Negative Mcmurray's and provocative meniscal tests. Non painful patellar compression. Patellar and quadriceps tendons unremarkable. Hamstring and quadriceps strength is normal.  Right knee The appearance of the knee is a bit more  squared off with medial prominence Flexion of the right knee is still good to 125 but this compares to 145 on the left She has full extension McMurray's testing does not cause pain She can do a step up and step down without instability but less steady than on the left leg She has hip abduction weakness on the right only  Standing x-rays with sunrise views On my interpretation she has significant loss of joint space on the right medial compartment There is also medial spurring The patellofemoral compartment is also showing loss of cartilage  On the left knee the joint space is better preserved with only mild narrowing medially with very small spurs Patellofemoral compartment seems better preserved

## 2019-04-05 NOTE — Assessment & Plan Note (Signed)
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 She still has functional motion of the knee She has very minimal cartilage to support spacing There is some narrowing in the patellofemoral joint but this does not seem to be symptomatic  We will plan on strengthening her hip abductors and doing a home exercise program to improve her overall strength of her quadriceps and hip muscles on the right Compression sleeve when standing or walking Icing for comfort At nighttime she may want to go ahead and use Aleve or ibuprofen along with food to see if she can get some relief  She will try this conservative care over the next 3 months and check with me after that time

## 2019-05-10 NOTE — Progress Notes (Signed)
Virtual Visit via Video Note  I connected with@ on 05/11/19 at 10:00 AM EDT by a video enabled telemedicine application and verified that I am speaking with the correct person using two identifiers. Location patient: home Location provider:work e office Persons participating in the virtual visit: patient, provider  [place] national recommendations  regarding COVID 19 pandemic   video visit is advised over in office visit for this patient. Patient aware  of the limitations of evaluation and management by telemedicine and  Limited availability of in person appointments. and agreed to proceed.   HPI: Laura Curtis presents for video visit  She is battling  Right knee pain felt to be medial compartment OA s/p injury fall 3 years ago and conservative therapy   So far continued daily pain  Exercise/ ice nsaid  Not sure if topicals ever helped . Gets mod mild swelling  .   Saw Dr.  Oneida Alar   in June  Medial arthritis  Was to f/u in September but  Would like to proceed with  Ortho referral  because of continued difficulty and progression of symptoms .   ROS: See pertinent positives and negatives per HPI. Feels well otherwise   In lock down   Past Medical History:  Diagnosis Date  . Biceps tendinopathy 01/14/2012  . 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 Hearing aid worn   . Hepatitis A   . Hip pain, right 12/31/2011   A mild amount of arthritis is probable with the limitation of rotation but the key finding today was weakness in abduction   . 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  [name] Hx of varicella   . HX: benign breast biopsy   . Left rotator cuff tear 01/14/2012   Supraspinatus tear noted on ultrasound. Provided instructions regarding nitroglycerin patches 1/4 patch daily to shoulder.   To continue circular motion exercises and range of motion. No over the head exercises.  No weight bearing on that side. She can continue to play tennis as tolerated.  FU in 2 weeks if no improvement,  4 weeks if improved.      . Shoulder pain, left 12/31/2011   History of rotator cuff tear on the right and now has nontraumatic left shoulder pain   . Syncope    June, 2013  . Uterine prolapse     Past Surgical History:  Procedure Laterality Date  . APPENDECTOMY  1968  . BREAST BIOPSY  1998  . MOHS SURGERY     for squamous cell ca left nose  . OVARIAN CYST SURGERY  1968  . ROTATOR CUFF REPAIR  2001  . TONSILLECTOMY AND ADENOIDECTOMY    . TUBAL LIGATION      Family History  Problem Relation Age of Onset  . Stroke Father        age 38  . Other Mother        old age died 29   . Deep vein thrombosis Mother     Social History   Tobacco Use  . Smoking status: Never Smoker  . Smokeless tobacco: Never Used  Substance Use Topics  . Alcohol use: Yes    Alcohol/week: 2.0 - 3.0 standard drinks    Types: 2 - 3 Standard drinks or equivalent per week    Comment: occ  . Drug use: No      Current Outpatient Medications:  .  estradiol (ESTRACE) 0.1 MG/GM vaginal cream, 1 gram  vaginally twice weekly, Disp: 42.5 g, Rfl: 1 .  fluticasone (FLONASE) 50 MCG/ACT nasal spray, Place 1 spray into both nostrils 2 (two) times daily., Disp: 16 g, Rfl: 0 .  nitroGLYCERIN (NITRODUR - DOSED IN MG/24 HR) 0.2 mg/hr patch, Use 1/4 patch daily to the affected area., Disp: 30 patch, Rfl: 1  EXAM: BP Readings from Last 3 Encounters:  04/05/19 (!) 88/58  12/30/18 94/62  12/07/18 108/68    VITALS per patient if applicable: GENERAL: alert, oriented, appears well and in no acute distress HEENT: atraumatic, conjunttiva clear, no obvious abnormalities on inspection of external nose and ears NECK: normal movements of the head and neck LUNGS: on inspection no signs of respiratory disress, breathing rate appears normal, no obvious gross SOB, gasping or wheezing CV: no obvious cyanosi  PSYCH/NEURO: pleasant and cooperative, no obvious depression or anxiety, speech and thought processing grossly  intact   ASSESSMENT AND PLAN:  Discussed the following assessment and plan:    ICD-10-CM   1. Chronic pain of right knee  M25.561 Ambulatory referral to Orthopedic Surgery   G89.29   2. Medial knee pain, right  M25.561 Ambulatory referral to Orthopedic Surgery   Agree continue with knee rehab exercises to maintain muscle  Mass and help with pain and function .  If not tried can add topical Voltaren qid in interim  .   And will proceed with referral to Dr Vickey Huger  Counseled.   Expectant management and discussion of plan and treatment with opportunity to ask questions and all were answered. The patient agreed with the plan and demonstrated an understanding of the instructions.   Advised to call back or seek an in-person evaluation if worsening  or having  further concerns . In interim .    Shanon Ace, MD

## 2019-05-11 ENCOUNTER — Encounter: Payer: Self-pay | Admitting: Internal Medicine

## 2019-05-11 ENCOUNTER — Ambulatory Visit (INDEPENDENT_AMBULATORY_CARE_PROVIDER_SITE_OTHER): Payer: Medicare Other | Admitting: Internal Medicine

## 2019-05-11 DIAGNOSIS — G8929 Other chronic pain: Secondary | ICD-10-CM | POA: Diagnosis not present

## 2019-05-11 DIAGNOSIS — M25561 Pain in right knee: Secondary | ICD-10-CM | POA: Diagnosis not present

## 2019-05-11 NOTE — Patient Instructions (Addendum)
Health Maintenance Due  Topic Date Due  . COLONOSCOPY  12/04/1993  . TETANUS/TDAP Please stop by your local pharmacy  01/13/2019    Depression screen [facility] 2/9 05/11/2019 07/14/2018 06/22/2017  Decreased Interest 0 0 0  Down, Depressed, Hopeless 0 0 0  PHQ - 2 Score 0 0 0

## 2019-06-02 ENCOUNTER — Telehealth (INDEPENDENT_AMBULATORY_CARE_PROVIDER_SITE_OTHER): Payer: Medicare Other | Admitting: Family Medicine

## 2019-06-02 ENCOUNTER — Other Ambulatory Visit: Payer: Self-pay

## 2019-06-02 DIAGNOSIS — R05 Cough: Secondary | ICD-10-CM

## 2019-06-02 DIAGNOSIS — J019 Acute sinusitis, unspecified: Secondary | ICD-10-CM | POA: Diagnosis not present

## 2019-06-02 NOTE — Progress Notes (Signed)
Virtual Visit via Telephone Note  I connected with Laura Curtis on 06/02/19 at  1:20 PM EDT by telephone and verified that I am speaking with the correct person using two identifiers.   I discussed the limitations, risks, security and privacy concerns of performing an evaluation and management service by telephone and the availability of in person appointments. I also discussed with the patient that there may be a patient responsible charge related to this service. The patient expressed understanding and agreed to proceed.  Location patient: home Location provider: work or home office Participants present for the call: patient, provider Patient did not have a visit in the prior 7 days to address this/these issue(s).   History of Present Illness:  Acute visit for sinus congestion: -started 4 days ago -symptoms: sore throat initially, ears feel full, nasal congestion,pnd,  possible mild sob once - none today, mild cough -denies: fever, chills, body aches, loss of taste or smell, diarrhea - does see family group of 8 adults and 3 small children; they have all been cautious during the pandemic and there are no known sick contacts  Observations/Objective: Patient sounds cheerful and well on the phone. I do not appreciate any SOB. Speech and thought processing are grossly intact. Patient reported vitals:  Assessment and Plan:  Rhinosinusitis Cough  Discussed potential etiologies, treatment options, risks and precautions. She suspects allergies, which very well may be the case - along with ETD. Opted for trial of nasal saline, antihistamine, shore course of flonase. Did discuss possibility of infectious etiology as well as mild COVID19, though seems less likely. She does not want to do testing for now, but is doing self isolation as a precaution. She agrees to seek medication care if symptoms worse, new symptoms arise or if she is not improving as expected.  Follow Up Instructions:  I  did not refer this patient for an OV in the next 24 hours for this/these issue(s).  I discussed the assessment and treatment plan with the patient. The patient was provided an opportunity to ask questions and all were answered. The patient agreed with the plan and demonstrated an understanding of the instructions.   I provided 12 minutes of non-face-to-face time during this encounter.   Lucretia Kern, DO

## 2019-06-03 ENCOUNTER — Other Ambulatory Visit: Payer: Self-pay

## 2019-06-03 DIAGNOSIS — Z20822 Contact with and (suspected) exposure to covid-19: Secondary | ICD-10-CM

## 2019-06-05 LAB — NOVEL CORONAVIRUS, NAA: SARS-CoV-2, NAA: NOT DETECTED

## 2019-06-06 DIAGNOSIS — G8929 Other chronic pain: Secondary | ICD-10-CM | POA: Diagnosis not present

## 2019-06-06 DIAGNOSIS — M25561 Pain in right knee: Secondary | ICD-10-CM | POA: Diagnosis not present

## 2019-07-19 NOTE — Progress Notes (Signed)
Chief Complaint  Patient presents with  . Annual Exam    pt has no concerns    HPI: Laura Curtis 73 y.o. comes in today for Preventive Medicare exam/ wellness visit . Doing well not  Right knee problematic but coping and active  No new sx   Health Maintenance  Topic Date Due  . TETANUS/TDAP  01/13/2019  . INFLUENZA VACCINE  05/28/2019  . Fecal DNA (Cologuard)  03/09/2021  . DEXA SCAN  Completed  . Hepatitis C Screening  Completed  . PNA vac Low Risk Adult  Completed   Health Maintenance Review LIFESTYLE:  Exercise:  Pickle ball outside some  Tobacco/ETS:no Alcohol:  ocass  Sugar beverages: Sleep: 6  Drug use: no HH: 2 downsizing   In process  of moving and happy with this decision   Hearing:  Blue tooth hearing aids   Vision:  No limitations at present . Last eye check UTD  Safety:  Has smoke detector and wears seat belts.  No firearms. No excess sun exposure. Sees dentist regularly.  Falls:  no  Memory: Felt to be good  , no concern from her or her family.  Depression: No anhedonia unusual crying or depressive symptoms  Nutrition: Eats well balanced diet; adequate calcium and vitamin D. No swallowing chewing problems.  Injury: no major injuries in the last six months.  Other healthcare providers:  Reviewed today .[name]   Preventive parameters: up-to-date  Reviewed   ADLS:   There are no problems or need for assistance  driving, feeding, obtaining food, dressing, toileting and bathing, managing money using phone. She is independent.    ROS:  GEN/ HEENT: No fever, significant weight changes sweats headaches vision problems hearing changes, CV/ PULM; No chest pain shortness of breath cough, syncope,edema  change in exercise tolerance. GI /GU: No adominal pain, vomiting, change in bowel habits. No blood in the stool. No significant GU symptoms. SKIN/HEME: ,no acute skin rashes suspicious lesions or bleeding. No lymphadenopathy, nodules, masses.  NEURO/ PSYCH:   No neurologic signs such as weakness numbness. No depression anxiety. IMM/ Allergy: No unusual infections.  Allergy .   REST of 12 system review negative except as per HPI   Past Medical History:  Diagnosis Date  . Biceps tendinopathy 01/14/2012  . 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 Hearing aid worn   . Hepatitis A   . Hip pain, right 12/31/2011   A mild amount of arthritis is probable with the limitation of rotation but the key finding today was weakness in abduction   . 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  [name] Hx of varicella   . HX: benign breast biopsy   . Left rotator cuff tear 01/14/2012   Supraspinatus tear noted on ultrasound. Provided instructions regarding nitroglycerin patches 1/4 patch daily to shoulder.   To continue circular motion exercises and range of motion. No over the head exercises.  No weight bearing on that side. She can continue to play tennis as tolerated.  FU in 2 weeks if no improvement, 4 weeks if improved.      . Shoulder pain, left 12/31/2011   History of rotator cuff tear on the right and now has nontraumatic left shoulder pain   . Syncope    June, 2013  . Uterine prolapse     Family History  Problem Relation Age of Onset  . Stroke Father  age 54  . Other Mother        old age died 79   . Deep vein thrombosis Mother     Social History   Socioeconomic History  . Marital status: Married    Spouse name: Not on file  . Number of children: Not on file  . Years of education: Not on file  . Highest education level: Not on file  Occupational History  . Not on file  Social Needs  . Financial resource strain: Not on file  . Food insecurity    Worry: Not on file    Inability: Not on file  . Transportation needs    Medical: Not on file    Non-medical: Not on file  Tobacco Use  . Smoking status: Never Smoker  . Smokeless tobacco: Never Used  Substance and Sexual Activity  . Alcohol use: Yes    Alcohol/week: 2.0 - 3.0  standard drinks    Types: 2 - 3 Standard drinks or equivalent per week    Comment: occ  . Drug use: No  . Sexual activity: Not Currently    Birth control/protection: None, Surgical    Comment: BTL  Lifestyle  . Physical activity    Days per week: Not on file    Minutes per session: Not on file  . Stress: Not on file  Relationships  . Social Herbalist on phone: Not on file    Gets together: Not on file    Attends religious service: Not on file    Active member of club or organization: Not on file    Attends meetings of clubs or organizations: Not on file    Relationship status: Not on file  Other Topics Concern  . 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 Sees dentist on a regular basis.    etoh 3 x per week                 Outpatient Encounter Medications as of 07/20/2019  Medication Sig  . estradiol (ESTRACE) 0.1 MG/GM vaginal cream 1 gram vaginally twice weekly  . fluticasone (FLONASE) 50 MCG/ACT nasal spray Place 1 spray into both nostrils 2 (two) times daily.  . nitroGLYCERIN (NITRODUR - DOSED IN MG/24 HR) 0.2 mg/hr patch Use 1/4 patch daily to the affected area.   No facility-administered encounter medications on file as of 07/20/2019.     EXAM:  BP 118/76 (BP Location: Right Arm, Patient Position: Sitting, Cuff Size: Normal)   Pulse 63   Temp 97.9 F (36.6 C) (Temporal)   Ht 5\' 5"  (1.651 m)   Wt 140 lb (63.5 kg)   LMP 10/28/1995   SpO2 96%   BMI 23.30 kg/m   Body mass index is 23.3 kg/m.  Physical Exam: Vital signs reviewed [id number] is a well-developed well-nourished alert cooperative   who appears stated age in no acute distress.  HEENT: normocephalic atraumatic , Eyes: PERRL EOM's full, conjunctiva clear, Nares: paten,t no deformity discharge or  tenderness., Ears: no deformity EAC's clear TMs with normal landmarks. Mouth: clear OP, no lesions, edema.  Moist mucous membranes. Dentition in adequate repair. NECK: supple without masses, thyromegaly or bruits. CHEST/PULM:  Clear to auscultation and percussion breath sounds equal no wheeze , rales or rhonchi. No chest wall deformities or tenderness. CV: PMI is nondisplaced, S1 S2 no gallops, murmurs, rubs. Peripheral pulses are full without delay.No JVD .  ABDOMEN: Bowel sounds normal nontender  No guard or rebound, no hepato splenomegal no CVA tenderness.   Extremtities:  No clubbing cyanosis or edema, no acute joint swelling or redness no focal atrophy NEURO:  Oriented x3, cranial nerves 3-12 appear to be intact, no obvious focal weakness,gait within normal limits no abnormal reflexes or asymmetrical SKIN: No acute rashes normal turgor, color, no bruising or petechiae. PSYCH: Oriented, good eye contact, no obvious depression anxiety, cognition and judgment appear normal. LN: no cervical axillary inguinal adenopathy No noted deficits in memory, attention, and speech.   Lab Results  Component Value Date   WBC 7.3 07/14/2018   HGB 14.3 07/14/2018   HCT 41.7 07/14/2018   PLT 245.0 07/14/2018   GLUCOSE 91 07/14/2018   CHOL 222 (H) 07/14/2018   TRIG 88.0 07/14/2018   HDL 64.80 07/14/2018   LDLDIRECT 138.7 07/27/2012   LDLCALC 139 (H) 07/14/2018   ALT 18 07/14/2018   AST 19 07/14/2018   NA 137 07/14/2018   K 4.0 07/14/2018   CL 101 07/14/2018   CREATININE 0.65 07/14/2018   BUN 16 07/14/2018   CO2 31 07/14/2018   TSH 4.18 08/31/2018    ASSESSMENT AND PLAN:  Discussed the following assessment and plan:  Visit for preventive health examination  Medication management  Hearing aid worn  Need for influenza vaccination - Plan: Flu Vaccine QUAD High Dose(Fluad)  Chronic pain of right knee Declined mammo   utd other  Td when appropriate  Can get at pharmacy.  Patient Care Team:  Panosh, Standley Brooking, MD as PCP - General (Internal Medicine) Martinique, Amy, MD as Consulting Physician (Dermatology) Megan Salon, MD as Consulting Physician (Gynecology)  Patient Instructions  Glad you are doing well.  Continue lifestyle intervention healthy eating and exercise .   shingrix vaccine when ready . Can try  OTC voltaren gel  4 cx per day as needed for the knee pain  . If  haing to take aleve every day   On a aregular basis   We should check lab for kidney function to make sure  No  Untoward effect.   If all ok then yearly exam .    Health Maintenance, Female Adopting a healthy lifestyle and getting preventive care are important in promoting health and wellness. Ask your health care provider about:  The right schedule for you to have regular tests and exams.  Things you can do on your own to prevent diseases and keep yourself healthy. What should I know about diet, weight, and exercise? Eat a healthy diet   Eat a diet that includes plenty of vegetables, fruits, low-fat dairy products, and lean protein.  Do not eat a lot of foods that are high in solid fats, added sugars, or sodium. Maintain a healthy weight Body mass index (BMI) is used to identify weight problems. It estimates body fat based on height and weight. Your health care provider can help determine your BMI and help you achieve or maintain a healthy weight. Get regular exercise Get regular exercise. This is one of the most important things you can do for your health. Most adults should:  Exercise for at least 150 minutes each week. The exercise should increase your heart rate and make you sweat (moderate-intensity  exercise).  Do strengthening exercises at least twice a week. This is in addition to the moderate-intensity exercise.  Spend less time sitting. Even light physical activity can be beneficial. Watch cholesterol and blood lipids Have your blood tested for lipids and cholesterol at 75 years of age,  then have this test every 5 years. Have your cholesterol levels checked more often if:  Your lipid or cholesterol levels are high.  You are older than 75 years of age.  You are at high risk for heart disease. What should I know about cancer screening? Depending on your health history and family history, you may need to have cancer screening at various ages. This may include screening for:  Breast cancer.  Cervical cancer.  Colorectal cancer.  Skin cancer.  Lung cancer. What should I know about heart disease, diabetes, and high blood pressure? Blood pressure and heart disease  High blood pressure causes heart disease and increases the risk of stroke. This is more likely to develop in people who have high blood pressure readings, are of African descent, or are overweight.  Have your blood pressure checked: ? Every 3-5 years if you are 74-42 years of age. ? Every year if you are 68 years old or older. Diabetes Have regular diabetes screenings. This checks your fasting blood sugar level. Have the screening done:  Once every three years after age 31 if you are at a normal weight and have a low risk for diabetes.  More often and at a younger age if you are overweight or have a high risk for diabetes. What should I know about preventing infection? Hepatitis B If you have a higher risk for hepatitis B, you should be screened for this virus. Talk with your health care provider to find out if you are at risk for hepatitis B infection. Hepatitis C Testing is recommended for:  Everyone born from [birth year] through 1965.  Anyone with known risk factors for hepatitis C. Sexually transmitted infections (STIs)  Get screened for STIs, including gonorrhea and chlamydia, if: ? You are sexually active and are younger than 75 years of age. ? You are older than 75 years of age and your health care provider tells you that you are at risk for this type of infection. ? Your sexual activity has  changed since you were last screened, and you are at increased risk for chlamydia or gonorrhea. Ask your health care provider if you are at risk.  Ask your health care provider about whether you are at high risk for HIV. Your health care provider may recommend a prescription medicine to help prevent HIV infection. If you choose to take medicine to prevent HIV, you should first get tested for HIV. You should then be tested every 3 months for as long as you are taking the medicine. Pregnancy  If you are about to stop having your period (premenopausal) and you may become pregnant, seek counseling before you get pregnant.  Take 400 to 800 micrograms (mcg) of folic acid every day if you become pregnant.  Ask for birth control (contraception) if you want to prevent pregnancy. Osteoporosis and menopause Osteoporosis is a disease in which the bones lose minerals and strength with aging. This can result in bone fractures. If you are 66 years old or older, or if you are at risk for osteoporosis and fractures, ask your health care provider if you should:  Be screened for bone loss.  Take a calcium or vitamin D supplement to lower your  risk of fractures.  Be given hormone replacement therapy (HRT) to treat symptoms of menopause. Follow these instructions at home: Lifestyle  Do not use any products that contain nicotine or tobacco, such as cigarettes, e-cigarettes, and chewing tobacco. If you need help quitting, ask your health care provider.  Do not use street drugs.  Do not share needles.  Ask your health care provider for help if you need support or information about quitting drugs. Alcohol use  Do not drink alcohol if: ? Your health care provider tells you not to drink. ? You are pregnant, may be pregnant, or are planning to become pregnant.  If you drink alcohol: ? Limit how much you use to 0-1 drink a day. ? Limit intake if you are breastfeeding.  Be aware of how much alcohol is in  your drink. In the [place] one drink equals one 12 oz bottle of beer (355 mL), one 5 oz glass of wine (148 mL), or one 1 oz glass of hard liquor (44 mL). General instructions  Schedule regular health, dental, and eye exams.  Stay current with your vaccines.  Tell your health care provider if: ? You often feel depressed. ? You have ever been abused or do not feel safe at home. Summary  Adopting a healthy lifestyle and getting preventive care are important in promoting health and wellness.  Follow your health care provider's instructions about healthy diet, exercising, and getting tested or screened for diseases.  Follow your health care provider's instructions on monitoring your cholesterol and blood pressure. This information is not intended to replace advice given to you by your health care provider. Make sure you discuss any questions you have with your health care provider. Document Released: 04/28/2011 Document Revised: 10/06/2018 Document Reviewed: 10/06/2018 Elsevier Patient Education  2020 [place] Panosh M.D.

## 2019-07-20 ENCOUNTER — Encounter: Payer: Self-pay | Admitting: Internal Medicine

## 2019-07-20 ENCOUNTER — Ambulatory Visit (INDEPENDENT_AMBULATORY_CARE_PROVIDER_SITE_OTHER): Payer: Medicare Other | Admitting: Internal Medicine

## 2019-07-20 ENCOUNTER — Other Ambulatory Visit: Payer: Self-pay

## 2019-07-20 VITALS — BP 118/76 | HR 63 | Temp 97.9°F | Ht 65.0 in | Wt 140.0 lb

## 2019-07-20 DIAGNOSIS — Z23 Encounter for immunization: Secondary | ICD-10-CM | POA: Diagnosis not present

## 2019-07-20 DIAGNOSIS — Z0000 Encounter for general adult medical examination without abnormal findings: Secondary | ICD-10-CM

## 2019-07-20 DIAGNOSIS — M25561 Pain in right knee: Secondary | ICD-10-CM

## 2019-07-20 DIAGNOSIS — G8929 Other chronic pain: Secondary | ICD-10-CM

## 2019-07-20 DIAGNOSIS — Z79899 Other long term (current) drug therapy: Secondary | ICD-10-CM | POA: Diagnosis not present

## 2019-07-20 DIAGNOSIS — Z974 Presence of external hearing-aid: Secondary | ICD-10-CM

## 2019-07-20 NOTE — Patient Instructions (Signed)
Glad you are doing well.  Continue lifestyle intervention healthy eating and exercise .   shingrix vaccine when ready . Can try  OTC voltaren gel  4 cx per day as needed for the knee pain  . If  haing to take aleve every day   On a aregular basis   We should check lab for kidney function to make sure  No  Untoward effect.   If all ok then yearly exam .    Health Maintenance, Female Adopting a healthy lifestyle and getting preventive care are important in promoting health and wellness. Ask your health care provider about:  The right schedule for you to have regular tests and exams.  Things you can do on your own to prevent diseases and keep yourself healthy. What should I know about diet, weight, and exercise? Eat a healthy diet   Eat a diet that includes plenty of vegetables, fruits, low-fat dairy products, and lean protein.  Do not eat a lot of foods that are high in solid fats, added sugars, or sodium. Maintain a healthy weight Body mass index (BMI) is used to identify weight problems. It estimates body fat based on height and weight. Your health care provider can help determine your BMI and help you achieve or maintain a healthy weight. Get regular exercise Get regular exercise. This is one of the most important things you can do for your health. Most adults should:  Exercise for at least 150 minutes each week. The exercise should increase your heart rate and make you sweat (moderate-intensity exercise).  Do strengthening exercises at least twice a week. This is in addition to the moderate-intensity exercise.  Spend less time sitting. Even light physical activity can be beneficial. Watch cholesterol and blood lipids Have your blood tested for lipids and cholesterol at 75 years of age, then have this test every 5 years. Have your cholesterol levels checked more often if:  Your lipid or cholesterol levels are high.  You are older than 75 years of age.  You are at high risk  for heart disease. What should I know about cancer screening? Depending on your health history and family history, you may need to have cancer screening at various ages. This may include screening for:  Breast cancer.  Cervical cancer.  Colorectal cancer.  Skin cancer.  Lung cancer. What should I know about heart disease, diabetes, and high blood pressure? Blood pressure and heart disease  High blood pressure causes heart disease and increases the risk of stroke. This is more likely to develop in people who have high blood pressure readings, are of African descent, or are overweight.  Have your blood pressure checked: ? Every 3-5 years if you are 2-18 years of age. ? Every year if you are 40 years old or older. Diabetes Have regular diabetes screenings. This checks your fasting blood sugar level. Have the screening done:  Once every three years after age 50 if you are at a normal weight and have a low risk for diabetes.  More often and at a younger age if you are overweight or have a high risk for diabetes. What should I know about preventing infection? Hepatitis B If you have a higher risk for hepatitis B, you should be screened for this virus. Talk with your health care provider to find out if you are at risk for hepatitis B infection. Hepatitis C Testing is recommended for:  Everyone born from [birth year] through 1967.  Anyone with known risk factors  for hepatitis C. Sexually transmitted infections (STIs)  Get screened for STIs, including gonorrhea and chlamydia, if: ? You are sexually active and are younger than 75 years of age. ? You are older than 75 years of age and your health care provider tells you that you are at risk for this type of infection. ? Your sexual activity has changed since you were last screened, and you are at increased risk for chlamydia or gonorrhea. Ask your health care provider if you are at risk.  Ask your health care provider about whether you are  at high risk for HIV. Your health care provider may recommend a prescription medicine to help prevent HIV infection. If you choose to take medicine to prevent HIV, you should first get tested for HIV. You should then be tested every 3 months for as long as you are taking the medicine. Pregnancy  If you are about to stop having your period (premenopausal) and you may become pregnant, seek counseling before you get pregnant.  Take 400 to 800 micrograms (mcg) of folic acid every day if you become pregnant.  Ask for birth control (contraception) if you want to prevent pregnancy. Osteoporosis and menopause Osteoporosis is a disease in which the bones lose minerals and strength with aging. This can result in bone fractures. If you are 48 years old or older, or if you are at risk for osteoporosis and fractures, ask your health care provider if you should:  Be screened for bone loss.  Take a calcium or vitamin D supplement to lower your risk of fractures.  Be given hormone replacement therapy (HRT) to treat symptoms of menopause. Follow these instructions at home: Lifestyle  Do not use any products that contain nicotine or tobacco, such as cigarettes, e-cigarettes, and chewing tobacco. If you need help quitting, ask your health care provider.  Do not use street drugs.  Do not share needles.  Ask your health care provider for help if you need support or information about quitting drugs. Alcohol use  Do not drink alcohol if: ? Your health care provider tells you not to drink. ? You are pregnant, may be pregnant, or are planning to become pregnant.  If you drink alcohol: ? Limit how much you use to 0-1 drink a day. ? Limit intake if you are breastfeeding.  Be aware of how much alcohol is in your drink. In the [place] one drink equals one 12 oz bottle of beer (355 mL), one 5 oz glass of wine (148 mL), or one 1 oz glass of hard liquor (44 mL). General instructions  Schedule regular health,  dental, and eye exams.  Stay current with your vaccines.  Tell your health care provider if: ? You often feel depressed. ? You have ever been abused or do not feel safe at home. Summary  Adopting a healthy lifestyle and getting preventive care are important in promoting health and wellness.  Follow your health care provider's instructions about healthy diet, exercising, and getting tested or screened for diseases.  Follow your health care provider's instructions on monitoring your cholesterol and blood pressure. This information is not intended to replace advice given to you by your health care provider. Make sure you discuss any questions you have with your health care provider. Document Released: 04/28/2011 Document Revised: 10/06/2018 Document Reviewed: 10/06/2018 Elsevier Patient Education  2020 [employer].

## 2019-08-15 ENCOUNTER — Telehealth: Payer: Self-pay | Admitting: Internal Medicine

## 2019-08-15 DIAGNOSIS — G8929 Other chronic pain: Secondary | ICD-10-CM | POA: Diagnosis not present

## 2019-08-15 DIAGNOSIS — M25561 Pain in right knee: Secondary | ICD-10-CM | POA: Diagnosis not present

## 2019-08-15 DIAGNOSIS — M1711 Unilateral primary osteoarthritis, right knee: Secondary | ICD-10-CM | POA: Diagnosis not present

## 2019-08-15 NOTE — Telephone Encounter (Signed)
Patient dropped off Pre-Op Risk Assessment  Fax form to: [phone]  ATTN: Dr. Ronnie Derby  Disposition: Dr's Folder

## 2019-08-16 NOTE — Telephone Encounter (Signed)
Form placed in red folder pt had yearly visit in September

## 2019-08-19 NOTE — Telephone Encounter (Signed)
Form completed and signed . Please send  And any notes requested.

## 2019-08-22 ENCOUNTER — Telehealth: Payer: Self-pay | Admitting: Obstetrics & Gynecology

## 2019-08-22 ENCOUNTER — Ambulatory Visit: Payer: Medicare Other | Admitting: Certified Nurse Midwife

## 2019-08-22 ENCOUNTER — Other Ambulatory Visit: Payer: Self-pay

## 2019-08-22 ENCOUNTER — Encounter: Payer: Self-pay | Admitting: Certified Nurse Midwife

## 2019-08-22 VITALS — BP 104/64 | HR 68 | Temp 97.1°F | Resp 16 | Wt 139.0 lb

## 2019-08-22 DIAGNOSIS — R319 Hematuria, unspecified: Secondary | ICD-10-CM

## 2019-08-22 DIAGNOSIS — N390 Urinary tract infection, site not specified: Secondary | ICD-10-CM

## 2019-08-22 LAB — POCT URINALYSIS DIPSTICK
Bilirubin, UA: NEGATIVE
Glucose, UA: NEGATIVE
Ketones, UA: NEGATIVE
Nitrite, UA: POSITIVE
Protein, UA: NEGATIVE
Urobilinogen, UA: NEGATIVE E.U./dL — AB
pH, UA: 5 (ref 5.0–8.0)

## 2019-08-22 MED ORDER — NITROFURANTOIN MONOHYD MACRO 100 MG PO CAPS: ORAL_CAPSULE | ORAL | 0 refills | Status: DC

## 2019-08-22 NOTE — Telephone Encounter (Signed)
Patient is calling regarding UTI. Patient is experiencing frequency, discomfort and cloudy urine. Patient stated that she has had these symptoms before and was treated for a UTI. Patient is requesting medication be called into pharmacy.

## 2019-08-22 NOTE — Progress Notes (Signed)
73 y.o. Married Caucasian female [phone] here with complaint of UTI, with onset  on last 24 hours. Patient complaining of urinary frequency/urgency/ and pain with urination. Patient denies fever, chills, nausea or back pain. Has not seen any blood in urine. No new personal products. Patient feels could be related to sexual activity. Denies any vaginal symptoms or vaginal bleeding..    . Menopausal with vaginal dryness, has Estrace cream and pessary and uses sporadic as needed.  Patient trying to consume adequate water intake, but trying to move from home now. Stress with overwhelming packing and tossing other things. No other health issues today.  Review of Systems  Constitutional: Negative.   HENT: Negative.   Eyes: Negative.   Respiratory: Negative.   Cardiovascular: Negative.   Gastrointestinal: Negative.   Genitourinary: Positive for frequency and urgency.  Musculoskeletal: Negative.   Skin: Negative.   Neurological: Negative.   Endo/Heme/Allergies: Negative.   Psychiatric/Behavioral: Negative.     poct urine- rbc tr, wbc 2+, nitrite + O: Healthy female WDWN Affect: Normal, orientation x 3 Skin : warm and dry CVAT: positive on left only  Abdomen: positive for suprapubic tenderness  Pelvic exam: External genital area:atrophic appearance, no lesions Bladder,Urethra tender, Urethral meatus: tender, red Vagina: scant vaginal discharge, atrophic appearance   Cervix: normal, non tender Uterus:normal,non tender Adnexa: normal non tender, no fullness or masses   A: UTI with hematuria Normal pelvic exam Atrophic vaginitis  P: Reviewed findings of UTI and need for treatment. Rx: Macrobid see order with instructions [id number] micro, culture Reviewed warning signs and symptoms of UTI and need to advise if occurring. Encouraged to limit soda, tea, and coffee and be sure to increase water intake and unsweetened cranberry juice. Discussed atrophic vaginitis and vaginal dryness can  increase risk of UTI. Discussed using her Estrace cream twice weekly as prescribed and apply small amount urethra to help with tissue change. Discussed daily coconut oil to combat dryness. Questions addressed.   RV prn

## 2019-08-22 NOTE — Patient Instructions (Signed)
Urinary Tract Infection, Adult A urinary tract infection (UTI) is an infection of any part of the urinary tract. The urinary tract includes:  The kidneys.  The ureters.  The bladder.  The urethra. These organs make, store, and get rid of pee (urine) in the body. What are the causes? This is caused by germs (bacteria) in your genital area. These germs grow and cause swelling (inflammation) of your urinary tract. What increases the risk? You are more likely to develop this condition if:  You have a small, thin tube (catheter) to drain pee.  You cannot control when you pee or poop (incontinence).  You are female, and: ? You use these methods to prevent pregnancy: ? A medicine that kills sperm (spermicide). ? A device that blocks sperm (diaphragm). ? You have low levels of a female hormone (estrogen). ? You are pregnant.  You have genes that add to your risk.  You are sexually active.  You take antibiotic medicines.  You have trouble peeing because of: ? A prostate that is bigger than normal, if you are female. ? A blockage in the part of your body that drains pee from the bladder (urethra). ? A kidney stone. ? A nerve condition that affects your bladder (neurogenic bladder). ? Not getting enough to drink. ? Not peeing often enough.  You have other conditions, such as: ? Diabetes. ? A weak disease-fighting system (immune system). ? Sickle cell disease. ? Gout. ? Injury of the spine. What are the signs or symptoms? Symptoms of this condition include:  Needing to pee right away (urgently).  Peeing often.  Peeing small amounts often.  Pain or burning when peeing.  Blood in the pee.  Pee that smells bad or not like normal.  Trouble peeing.  Pee that is cloudy.  Fluid coming from the vagina, if you are female.  Pain in the belly or lower back. Other symptoms include:  Throwing up (vomiting).  No urge to eat.  Feeling mixed up (confused).  Being tired  and grouchy (irritable).  A fever.  Watery poop (diarrhea). How is this treated? This condition may be treated with:  Antibiotic medicine.  Other medicines.  Drinking enough water. Follow these instructions at home:  Medicines  Take over-the-counter and prescription medicines only as told by your doctor.  If you were prescribed an antibiotic medicine, take it as told by your doctor. Do not stop taking it even if you start to feel better. General instructions  Make sure you: ? Pee until your bladder is empty. ? Do not hold pee for a long time. ? Empty your bladder after sex. ? Wipe from front to back after pooping if you are a female. Use each tissue one time when you wipe.  Drink enough fluid to keep your pee pale yellow.  Keep all follow-up visits as told by your doctor. This is important. Contact a doctor if:  You do not get better after 1-2 days.  Your symptoms go away and then come back. Get help right away if:  You have very bad back pain.  You have very bad pain in your lower belly.  You have a fever.  You are sick to your stomach (nauseous).  You are throwing up. Summary  A urinary tract infection (UTI) is an infection of any part of the urinary tract.  This condition is caused by germs in your genital area.  There are many risk factors for a UTI. These include having a small, thin   tube to drain pee and not being able to control when you pee or poop.  Treatment includes antibiotic medicines for germs.  Drink enough fluid to keep your pee pale yellow. This information is not intended to replace advice given to you by your health care provider. Make sure you discuss any questions you have with your health care provider. Document Released: 03/31/2008 Document Revised: 09/30/2018 Document Reviewed: 04/22/2018 Elsevier Patient Education  2020 Elsevier Inc.  

## 2019-08-22 NOTE — Telephone Encounter (Signed)
Spoke with patient. Patient reports urinary urgency, frequency, dysuria and cloudy urine. Symptoms started on 10/24. Hx of UTI, last tx 11/11/18. Denies fever/chills, blood in urine or flank pain. Advised patient OV needed for further evaluation, OV scheduled for today at 4pm with Melvia Heaps, CNM. Patient declined OV at 9:30am with Dr. Talbert Nan. [id number] prescreen negative, precautions reviewed.   Routing to provider for final review. Patient is agreeable to disposition. Will close encounter.  Cc: Melvia Heaps, CNM

## 2019-08-23 LAB — URINALYSIS, MICROSCOPIC ONLY
Casts: NONE SEEN /lpf
WBC, UA: >30 /hpf — AB (ref 0–5)

## 2019-08-23 NOTE — Telephone Encounter (Signed)
Form faxed

## 2019-08-24 LAB — URINE CULTURE

## 2019-08-25 NOTE — Telephone Encounter (Signed)
Lisa from Dr. Ruel Favors office stated they cannot read some of the writing on the forms sent over. Requesting forms to be resent or to have someone call and interpret writing. Please advise.  [id number]

## 2019-08-26 NOTE — Telephone Encounter (Signed)
Tried  Calling back got no answer and form was sent to scan not in computer yet

## 2019-08-29 ENCOUNTER — Ambulatory Visit: Payer: Medicare Other | Admitting: Obstetrics & Gynecology

## 2019-10-04 ENCOUNTER — Other Ambulatory Visit: Payer: Self-pay

## 2019-10-06 ENCOUNTER — Other Ambulatory Visit: Payer: Self-pay

## 2019-10-06 ENCOUNTER — Encounter: Payer: Self-pay | Admitting: Obstetrics & Gynecology

## 2019-10-06 ENCOUNTER — Ambulatory Visit: Payer: Medicare Other | Admitting: Obstetrics & Gynecology

## 2019-10-06 ENCOUNTER — Telehealth: Payer: Self-pay | Admitting: *Deleted

## 2019-10-06 VITALS — BP 110/64 | HR 76 | Temp 97.8°F | Ht 64.5 in | Wt 137.8 lb

## 2019-10-06 DIAGNOSIS — N952 Postmenopausal atrophic vaginitis: Secondary | ICD-10-CM | POA: Diagnosis not present

## 2019-10-06 DIAGNOSIS — N899 Noninflammatory disorder of vagina, unspecified: Secondary | ICD-10-CM | POA: Diagnosis not present

## 2019-10-06 DIAGNOSIS — N812 Incomplete uterovaginal prolapse: Secondary | ICD-10-CM | POA: Diagnosis not present

## 2019-10-06 DIAGNOSIS — N898 Other specified noninflammatory disorders of vagina: Secondary | ICD-10-CM

## 2019-10-06 MED ORDER — ESTRADIOL 0.1 MG/GM VA CREA: TOPICAL_CREAM | VAGINAL | 4 refills | Status: DC

## 2019-10-06 NOTE — Telephone Encounter (Signed)
Faxed medication received from [facility].   RX for estradiol (estrace) 0.1mg  vaginal cream.   Requesting to change to compounded estradiol 0.02% vaginal cream due to patients out of pocket cost.   Routing to Dr. Sabra Heck to review request.

## 2019-10-06 NOTE — Progress Notes (Signed)
GYNECOLOGY  VISIT  CC:   Vaginal Discharge  HPI: 73 y.o. [name] [name] female here for patient states that she has noticed a discharge when she uses her pessary. She states that it started about 6 months ago.  She saw Debbi Hollice Espy last month with a UTI.  She was advised to start using the pessary again and also to use vaginal estrogen cream with more regularity.  She is using the estrace 1 gram pv twice weekly.  Discharge has improved.  She does feel better when she has the pessary in placed.  Denies vaginal bleeding.  She does feel the discharge only occurs when uses her pessary.  GYNECOLOGIC HISTORY: Patient's last menstrual period was 10/28/1995. Contraception: none Menopausal hormone therapy: Estradiol  Patient Active Problem List   Diagnosis Date Noted  . Degenerative tear of medial meniscus of right knee 04/01/2016  . Hearing aid worn   . Greater trochanteric bursitis of right hip 04/15/2013  . Internal hordeolum 01/11/2013  . Hx of syncope 07/21/2012  . Counseling on health promotion and disease prevention 07/21/2012  . Hx of nonmelanoma skin cancer 07/21/2012  . History of positive PPD   . HX: benign breast biopsy   . Biceps tendinopathy 01/14/2012  . Hip pain, right 12/31/2011    Past Medical History:  Diagnosis Date  . Biceps tendinopathy 01/14/2012  . 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 Hearing aid worn   . Hepatitis A   . Hip pain, right 12/31/2011   A mild amount of arthritis is probable with the limitation of rotation but the key finding today was weakness in abduction   . 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  [name] Hx of varicella   . HX: benign breast biopsy   . Left rotator cuff tear 01/14/2012   Supraspinatus tear noted on ultrasound. Provided instructions regarding nitroglycerin patches 1/4 patch daily to shoulder.   To continue circular motion exercises and range of motion. No over the head exercises.  No weight bearing on  that side. She can continue to play tennis as tolerated.  FU in 2 weeks if no improvement, 4 weeks if improved.      . Shoulder pain, left 12/31/2011   History of rotator cuff tear on the right and now has nontraumatic left shoulder pain   . Syncope    June, 2013  . Uterine prolapse     Past Surgical History:  Procedure Laterality Date  . APPENDECTOMY  1968  . BREAST BIOPSY  1998  . MOHS SURGERY     for squamous cell ca left nose  . OVARIAN CYST SURGERY  1968  . ROTATOR CUFF REPAIR  2001  . TONSILLECTOMY AND ADENOIDECTOMY    . TUBAL LIGATION      MEDS:   Current Outpatient Medications on File Prior to Visit  Medication Sig Dispense Refill  . estradiol (ESTRACE) 0.1 MG/GM vaginal cream 1 gram vaginally twice weekly 42.5 g 1   No current facility-administered medications on file prior to visit.    ALLERGIES: Patient has no known allergies.  Family History  Problem Relation Age of Onset  . Stroke Father        age 73  . Other Mother        old age died 59   . Deep vein thrombosis Mother     SH:  Married, non smoker  Review of Systems  Genitourinary: Positive for  vaginal discharge.  All other systems reviewed and are negative.   PHYSICAL EXAMINATION:    BP 110/64   Pulse 76   Temp 97.8 F (36.6 C)   Ht 5' 4.5" (1.638 m)   Wt 137 lb 12.8 oz (62.5 kg)   LMP 10/28/1995   SpO2 96%   BMI 23.29 kg/m     General appearance: alert, cooperative and appears stated age Lymph:  no inguinal LAD noted  Pelvic: External genitalia:  no lesions              Urethra:  normal appearing urethra with no masses, tenderness or lesions              Bartholins and Skenes: normal                 Vagina: normal appearing vagina with normal color and discharge, no lesions, incomplete uterine prolapse present, no pessary in placed              Cervix: no lesions              Bimanual Exam:  Uterus:  normal size, contour, position, consistency, mobility, non-tender              Adnexa:  no mass, fullness, tenderness  Chaperone, Gae Dry, CMA, was present for exam.  Assessment: Vaginal discharge with pessary use that is improved with vaginal estrogen use  Plan: Affirm obtained today Continue vaginal estrogen cream 1 gram pv twice weekly for two more months.  She will then decrease to once weekly use at that point.   She knows to call with any UTI concerns.

## 2019-10-07 LAB — VAGINITIS/VAGINOSIS, DNA PROBE
Candida Species: NEGATIVE
Gardnerella vaginalis: NEGATIVE
Trichomonas vaginosis: NEGATIVE

## 2019-10-07 MED ORDER — NONFORMULARY OR COMPOUNDED ITEM: 3 refills | Status: DC

## 2019-10-07 NOTE — Telephone Encounter (Signed)
Rx written and will be faxed to [facility] unless she would like it sent somewhere else.  Thanks.

## 2019-10-10 NOTE — Telephone Encounter (Signed)
Patient notified. Confirmed [employer] for Pharmacy. Patient verbalizes understanding and is agreeable.

## 2019-10-17 ENCOUNTER — Ambulatory Visit: Payer: Medicare Other | Admitting: Podiatry

## 2019-10-18 ENCOUNTER — Other Ambulatory Visit: Payer: Medicare Other

## 2019-10-19 ENCOUNTER — Other Ambulatory Visit: Payer: Self-pay

## 2019-10-19 ENCOUNTER — Encounter: Payer: Self-pay | Admitting: Podiatry

## 2019-10-19 ENCOUNTER — Ambulatory Visit: Payer: Medicare Other | Admitting: Podiatry

## 2019-10-19 VITALS — BP 109/64 | HR 64 | Temp 98.6°F

## 2019-10-19 DIAGNOSIS — L600 Ingrowing nail: Secondary | ICD-10-CM | POA: Diagnosis not present

## 2019-10-20 ENCOUNTER — Telehealth: Payer: Self-pay

## 2019-10-20 NOTE — Telephone Encounter (Signed)
Spoke with pt she is going to [facility] to get blood work done

## 2019-10-20 NOTE — Telephone Encounter (Signed)
Copied from [place] [id number]. Topic: Quick Communication - See Telephone Encounter >> Oct 20, 2019  9:50 AM Loma Boston wrote: CRM for notification. See Telephone encounter for: 10/20/19. Pt is going to have total knee replacement and is needing specfic test for clearance by the 30th, please FU with pt asap at  [phone] leave message if does not answer

## 2019-10-24 DIAGNOSIS — Z96659 Presence of unspecified artificial knee joint: Secondary | ICD-10-CM | POA: Diagnosis not present

## 2019-10-26 NOTE — Progress Notes (Signed)
   Subjective: Patient presents today for evaluation of pain to the medial border of the left toe that began over a year ago. Patient is concerned for possible ingrown nail. Applying pressure and wearing shoes increases the pain. She has not done anything for treatment. Patient presents today for further treatment and evaluation.  Past Medical History:  Diagnosis Date  . Biceps tendinopathy 01/14/2012  . 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 Hearing aid worn   . Hepatitis A   . Hip pain, right 12/31/2011   A mild amount of arthritis is probable with the limitation of rotation but the key finding today was weakness in abduction   . 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  [name] Hx of varicella   . HX: benign breast biopsy   . Left rotator cuff tear 01/14/2012   Supraspinatus tear noted on ultrasound. Provided instructions regarding nitroglycerin patches 1/4 patch daily to shoulder.   To continue circular motion exercises and range of motion. No over the head exercises.  No weight bearing on that side. She can continue to play tennis as tolerated.  FU in 2 weeks if no improvement, 4 weeks if improved.      . Shoulder pain, left 12/31/2011   History of rotator cuff tear on the right and now has nontraumatic left shoulder pain   . Syncope    June, 2013  . Uterine prolapse     Objective:  General: Well developed, nourished, in no acute distress, alert and oriented x3   Dermatology: Skin is warm, dry and supple bilateral. Medial border of the left great toe appears to be erythematous with evidence of an ingrowing nail. Pain on palpation noted to the border of the nail fold. The remaining nails appear unremarkable at this time. There are no open sores, lesions.  Vascular: Dorsalis Pedis artery and Posterior Tibial artery pedal pulses palpable. No lower extremity edema noted.   Neruologic: Grossly intact via light touch bilateral.  Musculoskeletal: Muscular strength within normal limits  in all groups bilateral. Normal range of motion noted to all pedal and ankle joints.   Assesement: #1 Paronychia with ingrowing nail medial border left toe #2 Pain in toe #3 Incurvated nail  Plan of Care:  1. Patient evaluated.  2. Discussed treatment alternatives and plan of care. Recommended conservative management at this time.  3. OTC Tolcylen antifungal solution provided to patient.  4. Return to clinic as needed if we need to proceed with nail avulsion procedure.    Edrick Kins, DPM [facility]  Dr. Edrick Kins, [address]                                        [place], [place] [address]                Office [phone]  Fax [phone]

## 2019-11-07 DIAGNOSIS — M1711 Unilateral primary osteoarthritis, right knee: Secondary | ICD-10-CM | POA: Diagnosis not present

## 2019-11-07 DIAGNOSIS — M1712 Unilateral primary osteoarthritis, left knee: Secondary | ICD-10-CM | POA: Diagnosis not present

## 2019-11-07 DIAGNOSIS — Z96651 Presence of right artificial knee joint: Secondary | ICD-10-CM | POA: Diagnosis not present

## 2019-11-10 DIAGNOSIS — R262 Difficulty in walking, not elsewhere classified: Secondary | ICD-10-CM | POA: Diagnosis not present

## 2019-11-10 DIAGNOSIS — M25661 Stiffness of right knee, not elsewhere classified: Secondary | ICD-10-CM | POA: Diagnosis not present

## 2019-11-10 DIAGNOSIS — M6281 Muscle weakness (generalized): Secondary | ICD-10-CM | POA: Diagnosis not present

## 2019-11-10 DIAGNOSIS — M25561 Pain in right knee: Secondary | ICD-10-CM | POA: Diagnosis not present

## 2019-11-10 DIAGNOSIS — Z96659 Presence of unspecified artificial knee joint: Secondary | ICD-10-CM | POA: Diagnosis not present

## 2019-11-16 DIAGNOSIS — M25561 Pain in right knee: Secondary | ICD-10-CM | POA: Diagnosis not present

## 2019-11-17 DIAGNOSIS — Z96651 Presence of right artificial knee joint: Secondary | ICD-10-CM | POA: Diagnosis not present

## 2019-11-21 DIAGNOSIS — M25561 Pain in right knee: Secondary | ICD-10-CM | POA: Diagnosis not present

## 2019-11-24 ENCOUNTER — Ambulatory Visit: Payer: Medicare Other

## 2019-11-25 DIAGNOSIS — M25561 Pain in right knee: Secondary | ICD-10-CM | POA: Diagnosis not present

## 2019-11-28 DIAGNOSIS — M25561 Pain in right knee: Secondary | ICD-10-CM | POA: Diagnosis not present

## 2019-12-01 DIAGNOSIS — M25561 Pain in right knee: Secondary | ICD-10-CM | POA: Diagnosis not present

## 2019-12-03 ENCOUNTER — Ambulatory Visit: Payer: Medicare Other | Attending: Internal Medicine

## 2019-12-03 DIAGNOSIS — Z23 Encounter for immunization: Secondary | ICD-10-CM | POA: Insufficient documentation

## 2019-12-03 NOTE — Progress Notes (Signed)
   Covid-19 Vaccination Clinic  Name:  Laura Curtis    MRN: [id number] DOB: 1946/02/23  12/03/2019  [name] was observed post Covid-19 immunization for 15 minutes without incidence. She was provided with Vaccine Information Sheet and instruction to access the V-Safe system.   [name] was instructed to call 911 with any severe reactions post vaccine: [name] Difficulty breathing  . Swelling of your face and throat  . A fast heartbeat  . A bad rash all over your body  . Dizziness and weakness    Immunizations Administered    Name Date Dose VIS Date Route   Pfizer COVID-19 Vaccine 12/03/2019  9:55 AM 0.3 mL 10/07/2019 Intramuscular   Manufacturer: [employer]   Lot: [id number]   [place]: [id number]

## 2019-12-05 ENCOUNTER — Ambulatory Visit: Payer: Medicare Other

## 2019-12-05 DIAGNOSIS — M25561 Pain in right knee: Secondary | ICD-10-CM | POA: Diagnosis not present

## 2019-12-07 DIAGNOSIS — Z96651 Presence of right artificial knee joint: Secondary | ICD-10-CM | POA: Insufficient documentation

## 2019-12-08 DIAGNOSIS — M25561 Pain in right knee: Secondary | ICD-10-CM | POA: Diagnosis not present

## 2019-12-12 DIAGNOSIS — M25561 Pain in right knee: Secondary | ICD-10-CM | POA: Diagnosis not present

## 2019-12-26 DIAGNOSIS — M25561 Pain in right knee: Secondary | ICD-10-CM | POA: Diagnosis not present

## 2019-12-27 ENCOUNTER — Ambulatory Visit: Payer: Medicare Other

## 2019-12-27 ENCOUNTER — Ambulatory Visit: Payer: Medicare Other | Attending: Internal Medicine

## 2019-12-27 DIAGNOSIS — Z23 Encounter for immunization: Secondary | ICD-10-CM | POA: Insufficient documentation

## 2019-12-27 NOTE — Progress Notes (Signed)
   Covid-19 Vaccination Clinic  Name:  ELEZABETH YOUMAN    MRN: [id number] DOB: [birth year]  12/27/2019  Ms. Mesko was observed post Covid-19 immunization for 15 minutes without incident. She was provided with Vaccine Information Sheet and instruction to access the V-Safe system.   Ms. Seaver was instructed to call 911 with any severe reactions post vaccine: [name] Difficulty breathing  . Swelling of face and throat  . A fast heartbeat  . A bad rash all over body  . Dizziness and weakness   Immunizations Administered    Name Date Dose VIS Date Route   Pfizer COVID-19 Vaccine 12/27/2019  3:12 PM 0.3 mL 10/07/2019 Intramuscular   Manufacturer: [place]   Lot: [id number]   [place]: [id number]

## 2020-01-02 DIAGNOSIS — M25561 Pain in right knee: Secondary | ICD-10-CM | POA: Diagnosis not present

## 2020-01-10 DIAGNOSIS — M25561 Pain in right knee: Secondary | ICD-10-CM | POA: Diagnosis not present

## 2020-01-17 ENCOUNTER — Encounter: Payer: Self-pay | Admitting: Certified Nurse Midwife

## 2020-01-25 NOTE — Progress Notes (Signed)
74 y.o. [name] [name] female here for annual exam.  Doing well.  Had total knee replacement on the right about 12 weeks ago.  Is not doing formal PT at this point.  FLexion is at least 120 degrees.    Using her pessary.  Using the vaginal estrogen cream about twice weekly.  Is not having any discharge at this point.   Patient's last menstrual period was 10/28/1995.          Sexually active: No.  The current method of family planning is post menopausal status & BTL.    Exercising: Yes.    bike, pickel ball Smoker:  no  Health Maintenance: Pap:  12-28-15 neg, 01-26-18 neg History of abnormal Pap:  no MMG:  Over 53yrs ago, patient declines  Colonoscopy:  cologuard neg 03-09-18 BMD:   2015 normal TDaP:  2010 Pneumonia vaccine(s):  2017 Shingrix:   Discussed with this personally Hep C testing: donated blood Screening Labs:    reports that she has never smoked. She has never used smokeless tobacco. She reports current alcohol use of about 2.0 - 3.0 standard drinks of alcohol per week. She reports that she does not use drugs.  Past Medical History:  Diagnosis Date  . Biceps tendinopathy 01/14/2012  . 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 Hearing aid worn   . Hepatitis A   . Hip pain, right 12/31/2011   A mild amount of arthritis is probable with the limitation of rotation but the key finding today was weakness in abduction   . 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  [name] Hx of varicella   . HX: benign breast biopsy   . Left rotator cuff tear 01/14/2012   Supraspinatus tear noted on ultrasound. Provided instructions regarding nitroglycerin patches 1/4 patch daily to shoulder.   To continue circular motion exercises and range of motion. No over the head exercises.  No weight bearing on that side. She can continue to play tennis as tolerated.  FU in 2 weeks if no improvement, 4 weeks if improved.      . Shoulder pain, left 12/31/2011   History of rotator cuff tear  on the right and now has nontraumatic left shoulder pain   . Syncope    June, 2013  . Uterine prolapse     Past Surgical History:  Procedure Laterality Date  . APPENDECTOMY  1968  . BREAST BIOPSY  1998  . MOHS SURGERY     for squamous cell ca left nose  . OVARIAN CYST SURGERY  1968  . ROTATOR CUFF REPAIR  2001  . TONSILLECTOMY AND ADENOIDECTOMY    . TUBAL LIGATION      Current Outpatient Medications  Medication Sig Dispense Refill  . estradiol (ESTRACE) 0.1 MG/GM vaginal cream 1 gram vaginally twice weekly 42.5 g 4  . NONFORMULARY OR COMPOUNDED ITEM Estradiol cream 0.02% in 60ml prefilled syringes.  52ml pv twice weekly.  #43month supply. 1 each 3   No current facility-administered medications for this visit.    Family History  Problem Relation Age of Onset  . Stroke Father        age 70  . Other Mother        old age died 8   . Deep vein thrombosis Mother     Review of Systems  Constitutional: Negative.   HENT: Negative.   Eyes: Negative.   Respiratory: Negative.   Cardiovascular: Negative.  Gastrointestinal: Negative.   Endocrine: Negative.   Genitourinary: Negative.   Musculoskeletal: Negative.   Skin: Negative.   Allergic/Immunologic: Negative.   Neurological: Negative.   Psychiatric/Behavioral: Negative.     Exam:   Vitals:   01/31/20 1323  Temp: 97.7 F (36.5 C)    General appearance: alert, cooperative and appears stated age Head: Normocephalic, without obvious abnormality, atraumatic Neck: no adenopathy, supple, symmetrical, trachea midline and thyroid normal to inspection and palpation Lungs: clear to auscultation bilaterally Breasts: normal appearance, no masses or tenderness Heart: regular rate and rhythm Abdomen: soft, non-tender; bowel sounds normal; no masses,  no organomegaly Extremities: extremities normal, atraumatic, no cyanosis or edema Skin: Skin color, texture, turgor normal. No rashes or lesions Lymph nodes: Cervical,  supraclavicular, and axillary nodes normal. No abnormal inguinal nodes palpated Neurologic: Grossly normal   Pelvic: External genitalia:  no lesions              Urethra:  normal appearing urethra with no masses, tenderness or lesions              Bartholins and Skenes: normal                 Vagina: normal appearing vagina with normal color and discharge, no lesions              Cervix: no lesions              Pap taken: No. Bimanual Exam:  Uterus:  normal size, contour, position, consistency, mobility, non-tender              Adnexa: normal adnexa and no mass, fullness, tenderness               Rectovaginal: Declined               Anus:  normal sphincter tone, no lesions  Chaperone, Terence Lux, CMA, was present for exam.  A:  Well Woman with normal exam PMP, no HRT H/o LS&A H/o uterine prolapse with rectocele, uses #3 incontinence ring with support Mildly elevated lipids Family hx of hemochromatosis in her sister  P:   Mammogram guidelines reviewed.  Has declined testing. Plan cologuard again next year Ferritin obtained today pap smear not indicated BMD last done 2015 Declines updating tdap today Return annually or prn

## 2020-01-31 ENCOUNTER — Ambulatory Visit (INDEPENDENT_AMBULATORY_CARE_PROVIDER_SITE_OTHER): Payer: Medicare Other | Admitting: Obstetrics & Gynecology

## 2020-01-31 ENCOUNTER — Encounter: Payer: Self-pay | Admitting: Obstetrics & Gynecology

## 2020-01-31 ENCOUNTER — Other Ambulatory Visit: Payer: Self-pay

## 2020-01-31 VITALS — Temp 97.7°F | Ht 64.75 in | Wt 138.0 lb

## 2020-01-31 DIAGNOSIS — Z01419 Encounter for gynecological examination (general) (routine) without abnormal findings: Secondary | ICD-10-CM | POA: Diagnosis not present

## 2020-01-31 DIAGNOSIS — Z8349 Family history of other endocrine, nutritional and metabolic diseases: Secondary | ICD-10-CM

## 2020-01-31 MED ORDER — ESTRADIOL 0.1 MG/GM VA CREA: TOPICAL_CREAM | VAGINAL | 4 refills | Status: DC

## 2020-02-01 LAB — FERRITIN: Ferritin: 56 ng/mL (ref 15–150)

## 2020-02-07 ENCOUNTER — Telehealth: Payer: Self-pay | Admitting: Obstetrics & Gynecology

## 2020-02-07 NOTE — Progress Notes (Signed)
GYNECOLOGY  VISIT  CC:   Patient states that she has had urine odor, dark urine, "slightly uncomfortable", possible yeast.  Urine: trace RBC, 2+ RBC  HPI: 74 y.o. [name] [name] female here for urine odor and dysuria.  This has been going on for the past 3 days.  She was on amoxicillin for about 8 weeks.  She is having increased issues with looser stools.  She is also having increased issues with urgency before bowel movements.  Denies vaginal discharge.  Uses a pessary.  Reports urine is light colored but yesterday was much darker with odor.  Denies vaginal bleeding.  GYNECOLOGIC HISTORY: Patient's last menstrual period was 10/28/1995. Contraception: Tubal/PMP Menopausal hormone therapy: Estrace cream  Patient Active Problem List   Diagnosis Date Noted  . S/P TKR (total knee replacement) using cement, right 12/07/2019  . Vaginal atrophy 10/06/2019  . Incomplete uterine prolapse 10/06/2019  . Hearing aid worn   . Chronic pain of right knee 04/15/2013  . Internal hordeolum 01/11/2013  . Hx of syncope 07/21/2012  . Counseling on health promotion and disease prevention 07/21/2012  . Hx of nonmelanoma skin cancer 07/21/2012  . History of positive PPD   . HX: benign breast biopsy     Past Medical History:  Diagnosis Date  . Biceps tendinopathy 01/14/2012  . 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 Hearing aid worn   . Hepatitis A   . Hip pain, right 12/31/2011   A mild amount of arthritis is probable with the limitation of rotation but the key finding today was weakness in abduction   . 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  [name] Hx of varicella   . HX: benign breast biopsy   . Left rotator cuff tear 01/14/2012   Supraspinatus tear noted on ultrasound. Provided instructions regarding nitroglycerin patches 1/4 patch daily to shoulder.   To continue circular motion exercises and range of motion. No over the head exercises.  No weight bearing on that side.  She can continue to play tennis as tolerated.  FU in 2 weeks if no improvement, 4 weeks if improved.      . Shoulder pain, left 12/31/2011   History of rotator cuff tear on the right and now has nontraumatic left shoulder pain   . Syncope    June, 2013  . Uterine prolapse     Past Surgical History:  Procedure Laterality Date  . APPENDECTOMY  1968  . BREAST BIOPSY  1998  . MOHS SURGERY     for squamous cell ca left nose  . OVARIAN CYST SURGERY  1968  . REPLACEMENT TOTAL KNEE Right   . ROTATOR CUFF REPAIR  2001  . TONSILLECTOMY AND ADENOIDECTOMY    . TUBAL LIGATION      MEDS:   Current Outpatient Medications on File Prior to Visit  Medication Sig Dispense Refill  . estradiol (ESTRACE) 0.1 MG/GM vaginal cream 1 gram vaginally twice weekly 42.5 g 4   No current facility-administered medications on file prior to visit.    ALLERGIES: Patient has no known allergies.  Family History  Problem Relation Age of Onset  . Stroke Father        age 41  . Other Mother        old age died 34   . Deep vein thrombosis Mother     SH:  Married, non smoker  Review of Systems  Genitourinary: Positive for  dysuria.       Urine odor Vaginal discomfort  All other systems reviewed and are negative.   PHYSICAL EXAMINATION:    BP 128/70 (BP Location: Right Arm, Patient Position: Sitting, Cuff Size: Normal)   Pulse 68   Temp (!) 96.6 F (35.9 C) (Temporal)   Ht 5' 4.75" (1.645 m)   Wt 138 lb (62.6 kg)   LMP 10/28/1995   BMI 23.14 kg/m     General appearance: alert, cooperative and appears stated age Lymph:  no inguinal LAD noted  Pelvic: External genitalia:  no lesions              Urethra:  normal appearing urethra with no masses, tenderness or lesions              Bartholins and Skenes: normal                 Vagina: normal appearing vagina with normal color and discharge, no lesions              Cervix: no lesions              Bimanual Exam:  Uterus:  normal size, contour,  position, consistency, mobility, non-tender              Adnexa: no mass, fullness, tenderness  Chaperone, Terence Lux, CMA, was present for exam.  Assessment: Urinary urgency Dark and malodorous urine H/o antibitiotic use Urgency with bowel movements Microscopic hematuria on dip u/a today  Plan: Urine micro and culture pending.  Bactrim DS BID x 3 days.  rx to pharmacy. Imodium use discussed Probiotic use with Align discussed

## 2020-02-07 NOTE — Telephone Encounter (Signed)
Spoke with pt. AEX 01/31/20 with Dr Sabra Heck. Hx of UTIs Jan and Oct /2020. Knee replacement surgery 10/2019.  Pt reports having UTI sx x 2 days of urine odor and discomfort. Denies vaginal discharge, frequency and urgency. Pt states  finishing Amoxicillin Rx 875 mg BID on 01/25/2020 with knee replacement surgery.  Pt requesting OV to be seen. Pt offered earlier appt, pt declines. Pt scheduled for OV 02/08/20 at 9 am with Dr Sabra Heck. Pt agreeable and verbalized understanding. CPS Neg.   Routing to Dr Sabra Heck for review.  Encounter closed.

## 2020-02-07 NOTE — Telephone Encounter (Signed)
Patient is calling regarding UTI symptoms. Patient stated that she has noticed an odor and slight discomfort when urinating. Patient stated that she just completed an 8 week round of antibiotics on 01/25/2020.

## 2020-02-08 ENCOUNTER — Ambulatory Visit: Payer: Medicare Other | Admitting: Obstetrics & Gynecology

## 2020-02-08 ENCOUNTER — Other Ambulatory Visit: Payer: Self-pay

## 2020-02-08 ENCOUNTER — Encounter: Payer: Self-pay | Admitting: Obstetrics & Gynecology

## 2020-02-08 VITALS — BP 128/70 | HR 68 | Temp 96.6°F | Ht 64.75 in | Wt 138.0 lb

## 2020-02-08 DIAGNOSIS — R3129 Other microscopic hematuria: Secondary | ICD-10-CM | POA: Diagnosis not present

## 2020-02-08 DIAGNOSIS — R3915 Urgency of urination: Secondary | ICD-10-CM | POA: Diagnosis not present

## 2020-02-08 DIAGNOSIS — N898 Other specified noninflammatory disorders of vagina: Secondary | ICD-10-CM

## 2020-02-08 DIAGNOSIS — R8290 Unspecified abnormal findings in urine: Secondary | ICD-10-CM | POA: Diagnosis not present

## 2020-02-08 LAB — POCT URINALYSIS DIPSTICK
Bilirubin, UA: NEGATIVE
Glucose, UA: NEGATIVE
Ketones, UA: NEGATIVE
Nitrite, UA: NEGATIVE
Odor: NORMAL
Protein, UA: NEGATIVE
Urobilinogen, UA: 0.2 E.U./dL
pH, UA: 5 (ref 5.0–8.0)

## 2020-02-08 MED ORDER — SULFAMETHOXAZOLE-TRIMETHOPRIM 800-160 MG PO TABS: 1.0000 | ORAL_TABLET | Freq: Two times a day (BID) | ORAL | 0 refills | Status: DC

## 2020-02-09 LAB — VAGINITIS/VAGINOSIS, DNA PROBE
Candida Species: NEGATIVE
Gardnerella vaginalis: NEGATIVE
Trichomonas vaginosis: NEGATIVE

## 2020-02-09 LAB — URINALYSIS, MICROSCOPIC ONLY
Bacteria, UA: NONE SEEN
Casts: NONE SEEN /lpf
RBC: NONE SEEN /hpf (ref 0–2)
WBC, UA: >30 /hpf — AB (ref 0–5)

## 2020-02-11 LAB — URINE CULTURE

## 2020-02-14 ENCOUNTER — Telehealth: Payer: Self-pay

## 2020-02-14 DIAGNOSIS — R3915 Urgency of urination: Secondary | ICD-10-CM

## 2020-02-14 DIAGNOSIS — R3129 Other microscopic hematuria: Secondary | ICD-10-CM

## 2020-02-14 NOTE — Telephone Encounter (Signed)
-----   Message from Megan Salon, MD sent at 02/13/2020 12:55 PM EDT ----- Please let pt know her urine culture grew both e coli and staph aureus.  The e coli had only intermediate sensitivity to the antibiotic that I gave her.  The staph aureus should have been treated.  If she is still having any symptoms, would recommend treatment with macrobid 100mg  id x 5 day.  If symptoms are gone, would recommend repeat urine culture in 7-10 days to make sure the two bacteria are indeed gone.  Does not need repeat micro as there was no blood in mcro on 02/08/2020.  Thanks.

## 2020-02-14 NOTE — Telephone Encounter (Signed)
Left message for pt to return call to triage RN. 

## 2020-02-22 NOTE — Telephone Encounter (Signed)
Spoke with pt. Pt given results and recommendations per Dr Sabra Heck from 02/14/20.  Pt agreeable and verbalized understanding.    Pt states was better, but started noticing slight pain again x 1 day. Pt states she is currently on vacation and would give a call back to office on Friday 4/30.  Does pt need Macrobid Rx as discussed in results?   Will review with Dr Sabra Heck and will inform pt when calls back on Friday 4/30  Routing to Dr Sabra Heck.

## 2020-02-23 ENCOUNTER — Other Ambulatory Visit: Payer: Self-pay | Admitting: Obstetrics & Gynecology

## 2020-02-23 MED ORDER — NITROFURANTOIN MONOHYD MACRO 100 MG PO CAPS: 100.0000 mg | ORAL_CAPSULE | Freq: Two times a day (BID) | ORAL | 0 refills | Status: DC

## 2020-02-23 NOTE — Telephone Encounter (Signed)
Left message for pt to return call to triage RN. 

## 2020-02-23 NOTE — Telephone Encounter (Signed)
Please let her know to start the macrobid 100mg  bid x 5 days when she gets back.  If she's willing, I'd love her to leave a urine sample before she starts the antibiotics.  If that's not possible, that is ok.  Thanks.

## 2020-02-26 ENCOUNTER — Encounter: Payer: Self-pay | Admitting: Obstetrics & Gynecology

## 2020-02-27 NOTE — Telephone Encounter (Signed)
Call placed to pt for follow up left message on 02/23/20. Spoke with pt. Pt states did not return home until late Friday night 02/24/20. Pt states sx have resolved. Pt to come and give urine sample 02/28/20 at 8:55 am per Dr Sabra Heck. Pt agreeable and verbalized understanding.   + UC on 02/08/20 New Rx for Macrobid not given.   Per result notes from Dr Sabra Heck on 02/13/20:  If symptoms are gone, would recommend repeat urine culture in 7-10 days to make sure the two bacteria are indeed gone.   Routing to Dr Sabra Heck for review.  Encounter closed.

## 2020-02-28 ENCOUNTER — Other Ambulatory Visit: Payer: Self-pay

## 2020-02-28 ENCOUNTER — Ambulatory Visit (INDEPENDENT_AMBULATORY_CARE_PROVIDER_SITE_OTHER): Payer: Medicare Other | Admitting: *Deleted

## 2020-02-28 DIAGNOSIS — R3129 Other microscopic hematuria: Secondary | ICD-10-CM

## 2020-02-28 DIAGNOSIS — R3915 Urgency of urination: Secondary | ICD-10-CM

## 2020-02-28 NOTE — Progress Notes (Signed)
Patient here for test of cure UC. Patient declines any urinary symptoms today. States she completed antibiotic as prescribed. Clean catch urine provided and sent for UC. Patient advised would be updated once results received and reviewed by Dr. Sabra Heck. Patient agreeable.   Routing to provider and will close encounter.

## 2020-03-01 LAB — URINE CULTURE: Organism ID, Bacteria: NO GROWTH

## 2020-04-23 DIAGNOSIS — D2271 Melanocytic nevi of right lower limb, including hip: Secondary | ICD-10-CM | POA: Diagnosis not present

## 2020-04-23 DIAGNOSIS — D225 Melanocytic nevi of trunk: Secondary | ICD-10-CM | POA: Diagnosis not present

## 2020-04-23 DIAGNOSIS — I788 Other diseases of capillaries: Secondary | ICD-10-CM | POA: Diagnosis not present

## 2020-04-23 DIAGNOSIS — L821 Other seborrheic keratosis: Secondary | ICD-10-CM | POA: Diagnosis not present

## 2020-04-23 DIAGNOSIS — D2262 Melanocytic nevi of left upper limb, including shoulder: Secondary | ICD-10-CM | POA: Diagnosis not present

## 2020-06-26 ENCOUNTER — Other Ambulatory Visit: Payer: Self-pay

## 2020-06-26 NOTE — Progress Notes (Signed)
Chief Complaint  Patient presents with  . Annual Exam    Doing well    HPI: Laura Curtis 74 y.o. comes in today for Preventive Medicare exam/ wellness visit .Since last visit.  Had gyne well women check 4 2021 declines mammo hearing assistance aids   tka  Right     January   And since recovery back to good activity level   Had antibiotic for 8 + weeks  For dental problem but had to delay procedure cause of tka.  Had bowel habit change  Loose and urgent getting better   Now daily  Nl and then loose at end  No blood  . Otherwise ok   Had uti in past year  Health Maintenance  Topic Date Due  . TETANUS/TDAP  01/13/2019  . INFLUENZA VACCINE  05/27/2020  . DEXA SCAN  Completed  . COVID-19 Vaccine  Completed  . Hepatitis C Screening  Completed  . PNA vac Low Risk Adult  Completed   Health Maintenance Review LIFESTYLE:  Exercise:  Active  Pickle ball  Tobacco/ETS:n  HH:2  Hearing: aids   Vision:  No limitations at present . Last eye check UTD  Safety:  Has smoke detector and wears seat belts.  No excess sun exposure. Sees dentist regularly.  Falls: n  Memory: Felt to be good  , no concern from her or her family.  Depression: No anhedonia unusual crying or depressive symptoms  Nutrition: Eats well balanced diet; adequate calcium and vitamin D. No swallowing chewing problems.  Other healthcare providers:  Reviewed today .  Preventive parameters: up-to-date  Reviewed   ADLS:   There are no problems or need for assistance  driving, feeding, obtaining food, dressing, toileting and bathing, managing money using phone. She is independent.    ROS:  GEN/ HEENT: No fever, significant weight changes sweats headaches vision problems hearing changes, CV/ PULM; No chest pain shortness of breath cough, syncope,edema  change in exercise tolerance. GI /GU: No adominal pain, vomiting, see above . No blood in the stool. No significant GU symptoms. SKIN/HEME: ,no acute skin  rashes suspicious lesions or bleeding. No lymphadenopathy, nodules, masses.  NEURO/ PSYCH:  No neurologic signs such as weakness numbness. No depression anxiety. IMM/ Allergy: No unusual infections.  Allergy .   REST of 12 system review negative except as per HPI   Past Medical History:  Diagnosis Date  . Biceps tendinopathy 01/14/2012  . 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 Chronic pain of right knee 04/15/2013   Injected April 15, 2013  . Hearing aid worn   . Hepatitis A   . Hip pain, right 12/31/2011   A mild amount of arthritis is probable with the limitation of rotation but the key finding today was weakness in abduction   . 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  [name] Hx of varicella   . HX: benign breast biopsy   . Left rotator cuff tear 01/14/2012   Supraspinatus tear noted on ultrasound. Provided instructions regarding nitroglycerin patches 1/4 patch daily to shoulder.   To continue circular motion exercises and range of motion. No over the head exercises.  No weight bearing on that side. She can continue to play tennis as tolerated.  FU in 2 weeks if no improvement, 4 weeks if improved.      . Shoulder pain, left 12/31/2011   History of rotator cuff tear on the right and  now has nontraumatic left shoulder pain   . Syncope    June, 2013  . Uterine prolapse     Family History  Problem Relation Age of Onset  . Stroke Father        age 62  . Other Mother        old age died 77   . Deep vein thrombosis Mother     Social History   Socioeconomic History  . Marital status: Married    Spouse name: Not on file  . Number of children: Not on file  . Years of education: Not on file  . Highest education level: Not on file  Occupational History  . Not on file  Tobacco Use  . Smoking status: Never Smoker  . Smokeless tobacco: Never Used  Vaping Use  . Vaping Use: Never used  Substance and Sexual Activity  . Alcohol use: Yes    Alcohol/week: 2.0 - 3.0 standard  drinks    Types: 2 - 3 Standard drinks or equivalent per week    Comment: occ  . Drug use: No  . Sexual activity: Not Currently    Birth control/protection: None, Surgical    Comment: BTL  Other Topics Concern  . 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 Sees dentist on a regular basis.    etoh 3 x per week                Social Determinants of Health   Financial Resource Strain:   . Difficulty of Paying Living Expenses: Not on file  Food Insecurity:   . Worried About Charity fundraiser in the Last Year: Not on file  . Ran Out of Food in the Last Year: Not on file  Transportation Needs:   . Lack of Transportation (Medical): Not on file  . Lack of Transportation (Non-Medical): Not on file  Physical Activity:   . Days of Exercise per Week: Not on file  . Minutes of Exercise per Session: Not on file  Stress:   . Feeling of Stress : Not on file  Social Connections:   . Frequency of Communication with Friends and Family: Not on file  . Frequency of Social Gatherings with Friends and Family: Not on file  . Attends Religious Services: Not on file  . Active Member of Clubs or Organizations: Not on file  . Attends Archivist Meetings: Not on file  . Marital Status: Not on file    Outpatient Encounter Medications as of 06/27/2020  Medication Sig  . estradiol (ESTRACE) 0.1 MG/GM vaginal cream 1 gram vaginally twice weekly   No facility-administered encounter medications on file as of 06/27/2020.    EXAM:  BP 118/76   Pulse 67   Temp 98.4 F (36.9 C) (Oral)   Ht 5' 3.75" (1.619 m)   Wt 138 lb 9.6 oz (62.9 kg)   LMP 10/28/1995   SpO2 97%   BMI 23.98 kg/m   Body mass index is 23.98 kg/m.  Physical Exam: Vital signs reviewed GLO:VFIE is a well-developed  well-nourished alert cooperative   who appears stated age in no acute distress.  HEENT: normocephalic atraumatic , Eyes: PERRL EOM's full, conjunctiva  clear, Nares: paten,t no deformity discharge or tenderness., Ears: no deformity EAC's 1+ wax right  TMs with normal landmarks. Mouth: masked  NECK: supple without masses, thyromegaly or bruits. CHEST/PULM:  Clear to auscultation and percussion breath sounds equal no wheeze , rales or rhonchi. No chest wall deformities or tenderness. CV: PMI is nondisplaced, S1 S2 no gallops, murmurs, rubs. Peripheral pulses are full without delay.No JVD .  ABDOMEN: Bowel sounds normal nontender  No guard or rebound, no hepato splenomegal no CVA tenderness.   Extremtities:  No clubbing cyanosis or edema, no acute joint swelling or redness no focal atrophy well healed  tka right  No edema  NEURO:  Oriented x3, cranial nerves 3-12 appear to be intact, no obvious focal weakness,gait within normal limits no abnormal reflexes or asymmetrical SKIN: No acute rashes normal turgor, color, no bruising or petechiae. Sun changes  PSYCH: Oriented, good eye contact, no obvious depression anxiety, cognition and judgment appear normal. LN: no cervical axillary inguinal adenopathy No noted deficits in memory, attention, and speech.   Lab Results  Component Value Date   WBC 7.3 07/14/2018   HGB 14.3 07/14/2018   HCT 41.7 07/14/2018   PLT 245.0 07/14/2018   GLUCOSE 91 07/14/2018   CHOL 222 (H) 07/14/2018   TRIG 88.0 07/14/2018   HDL 64.80 07/14/2018   LDLDIRECT 138.7 07/27/2012   LDLCALC 139 (H) 07/14/2018   ALT 18 07/14/2018   AST 19 07/14/2018   NA 137 07/14/2018   K 4.0 07/14/2018   CL 101 07/14/2018   CREATININE 0.65 07/14/2018   BUN 16 07/14/2018   CO2 31 07/14/2018   TSH 4.18 08/31/2018    ASSESSMENT AND PLAN:  Discussed the following assessment and plan:  Visit for preventive health examination  Medication management  Hearing aid worn  Elevated LDL  cholesterol level  History of total knee replacement, right Ok to wait and get dexa next year    If needed  Shared Decision Making no compelling need for blood work today  cologuard due 5 2022  If any concerns about  Gi will do gi evaluation  High dose flu vaccine    Reasonable to space about 2 weeks   For vaccines coming up  Patient Care Team: Burnis Medin, MD as PCP - General (Internal Medicine) Martinique, Amy, MD as Consulting Physician (Dermatology) Megan Salon, MD as Consulting Physician (Gynecology)  Patient Instructions  Jim Desanctis  You are doing well.  Continue lifestyle intervention healthy eating and exercise .  If concern about bowel  Changes after antibiotics we can have you see GI appt but suspect this should normalize .  Due for cologuard 5 2022. High  dose flu vaccine this fall .   Health Maintenance, Female Adopting a healthy lifestyle and getting preventive care are important in promoting health and wellness. Ask your health care provider about:  The right schedule for you to have regular tests and exams.  Things you can do on your own to prevent diseases and keep yourself healthy. What should I know about diet, weight, and exercise? Eat a healthy diet   Eat a diet that includes plenty of vegetables, fruits, low-fat dairy products, and lean protein.  Do not eat a lot of foods that are high in solid fats, added sugars, or sodium. Maintain a healthy weight Body mass index (BMI) is used to identify weight problems. It estimates body fat based on height and weight. Your health care provider can help determine your BMI  and help you achieve or maintain a healthy weight. Get regular exercise Get regular exercise. This is one of the most important things you can do for your health. Most adults should:  Exercise for at least 150 minutes each week. The exercise should increase your heart rate and make you sweat (moderate-intensity exercise).  Do strengthening exercises at  least twice a week. This is in addition to the moderate-intensity exercise.  Spend less time sitting. Even light physical activity can be beneficial. Watch cholesterol and blood lipids Have your blood tested for lipids and cholesterol at 76 years of age, then have this test every 5 years. Have your cholesterol levels checked more often if:  Your lipid or cholesterol levels are high.  You are older than 76 years of age.  You are at high risk for heart disease. What should I know about cancer screening? Depending on your health history and family history, you may need to have cancer screening at various ages. This may include screening for:  Breast cancer.  Cervical cancer.  Colorectal cancer.  Skin cancer.  Lung cancer. What should I know about heart disease, diabetes, and high blood pressure? Blood pressure and heart disease  High blood pressure causes heart disease and increases the risk of stroke. This is more likely to develop in people who have high blood pressure readings, are of African descent, or are overweight.  Have your blood pressure checked: ? Every 3-5 years if you are 60-72 years of age. ? Every year if you are 76 years old or older. Diabetes Have regular diabetes screenings. This checks your fasting blood sugar level. Have the screening done:  Once every three years after age 1 if you are at a normal weight and have a low risk for diabetes.  More often and at a younger age if you are overweight or have a high risk for diabetes. What should I know about preventing infection? Hepatitis B If you have a higher risk for hepatitis B, you should be screened for this virus. Talk with your health care provider to find out if you are at risk for hepatitis B infection. Hepatitis C Testing is recommended for:  Everyone born from [birth year] through 1967.  Anyone with known risk factors for hepatitis C. Sexually transmitted infections (STIs)  Get screened for STIs,  including gonorrhea and chlamydia, if: ? You are sexually active and are younger than 76 years of age. ? You are older than 76 years of age and your health care provider tells you that you are at risk for this type of infection. ? Your sexual activity has changed since you were last screened, and you are at increased risk for chlamydia or gonorrhea. Ask your health care provider if you are at risk.  Ask your health care provider about whether you are at high risk for HIV. Your health care provider may recommend a prescription medicine to help prevent HIV infection. If you choose to take medicine to prevent HIV, you should first get tested for HIV. You should then be tested every 3 months for as long as you are taking the medicine. Pregnancy  If you are about to stop having your period (premenopausal) and you may become pregnant, seek counseling before you get pregnant.  Take 400 to 800 micrograms (mcg) of folic acid every day if you become pregnant.  Ask for birth control (contraception) if you want to prevent pregnancy. Osteoporosis and menopause Osteoporosis is a disease in which the bones lose minerals  and strength with aging. This can result in bone fractures. If you are 45 years old or older, or if you are at risk for osteoporosis and fractures, ask your health care provider if you should:  Be screened for bone loss.  Take a calcium or vitamin D supplement to lower your risk of fractures.  Be given hormone replacement therapy (HRT) to treat symptoms of menopause. Follow these instructions at home: Lifestyle  Do not use any products that contain nicotine or tobacco, such as cigarettes, e-cigarettes, and chewing tobacco. If you need help quitting, ask your health care provider.  Do not use street drugs.  Do not share needles.  Ask your health care provider for help if you need support or information about quitting drugs. Alcohol use  Do not drink alcohol if: ? Your health care  provider tells you not to drink. ? You are pregnant, may be pregnant, or are planning to become pregnant.  If you drink alcohol: ? Limit how much you use to 0-1 drink a day. ? Limit intake if you are breastfeeding.  Be aware of how much alcohol is in your drink. In the [place] one drink equals one 12 oz bottle of beer (355 mL), one 5 oz glass of wine (148 mL), or one 1 oz glass of hard liquor (44 mL). General instructions  Schedule regular health, dental, and eye exams.  Stay current with your vaccines.  Tell your health care provider if: ? You often feel depressed. ? You have ever been abused or do not feel safe at home. Summary  Adopting a healthy lifestyle and getting preventive care are important in promoting health and wellness.  Follow your health care provider's instructions about healthy diet, exercising, and getting tested or screened for diseases.  Follow your health care provider's instructions on monitoring your cholesterol and blood pressure. This information is not intended to replace advice given to you by your health care provider. Make sure you discuss any questions you have with your health care provider. Document Revised: 10/06/2018 Document Reviewed: 10/06/2018 Elsevier Patient Education  2020 [place] Denika Krone M.D.

## 2020-06-27 ENCOUNTER — Encounter: Payer: Self-pay | Admitting: Internal Medicine

## 2020-06-27 ENCOUNTER — Ambulatory Visit (INDEPENDENT_AMBULATORY_CARE_PROVIDER_SITE_OTHER): Payer: Medicare Other | Admitting: Internal Medicine

## 2020-06-27 VITALS — BP 118/76 | HR 67 | Temp 98.4°F | Ht 63.75 in | Wt 138.6 lb

## 2020-06-27 DIAGNOSIS — E7800 Pure hypercholesterolemia, unspecified: Secondary | ICD-10-CM | POA: Diagnosis not present

## 2020-06-27 DIAGNOSIS — Z96651 Presence of right artificial knee joint: Secondary | ICD-10-CM

## 2020-06-27 DIAGNOSIS — Z0000 Encounter for general adult medical examination without abnormal findings: Secondary | ICD-10-CM

## 2020-06-27 DIAGNOSIS — Z974 Presence of external hearing-aid: Secondary | ICD-10-CM

## 2020-06-27 DIAGNOSIS — Z79899 Other long term (current) drug therapy: Secondary | ICD-10-CM

## 2020-06-27 NOTE — Patient Instructions (Addendum)
Glad  You are doing well.  Continue lifestyle intervention healthy eating and exercise .  If concern about bowel  Changes after antibiotics we can have you see GI appt but suspect this should normalize .  Due for cologuard 5 2022. High  dose flu vaccine this fall .   Health Maintenance, Female Adopting a healthy lifestyle and getting preventive care are important in promoting health and wellness. Ask your health care provider about:  The right schedule for you to have regular tests and exams.  Things you can do on your own to prevent diseases and keep yourself healthy. What should I know about diet, weight, and exercise? Eat a healthy diet   Eat a diet that includes plenty of vegetables, fruits, low-fat dairy products, and lean protein.  Do not eat a lot of foods that are high in solid fats, added sugars, or sodium. Maintain a healthy weight Body mass index (BMI) is used to identify weight problems. It estimates body fat based on height and weight. Your health care provider can help determine your BMI and help you achieve or maintain a healthy weight. Get regular exercise Get regular exercise. This is one of the most important things you can do for your health. Most adults should:  Exercise for at least 150 minutes each week. The exercise should increase your heart rate and make you sweat (moderate-intensity exercise).  Do strengthening exercises at least twice a week. This is in addition to the moderate-intensity exercise.  Spend less time sitting. Even light physical activity can be beneficial. Watch cholesterol and blood lipids Have your blood tested for lipids and cholesterol at 76 years of age, then have this test every 5 years. Have your cholesterol levels checked more often if:  Your lipid or cholesterol levels are high.  You are older than 76 years of age.  You are at high risk for heart disease. What should I know about cancer screening? Depending on your health  history and family history, you may need to have cancer screening at various ages. This may include screening for:  Breast cancer.  Cervical cancer.  Colorectal cancer.  Skin cancer.  Lung cancer. What should I know about heart disease, diabetes, and high blood pressure? Blood pressure and heart disease  High blood pressure causes heart disease and increases the risk of stroke. This is more likely to develop in people who have high blood pressure readings, are of African descent, or are overweight.  Have your blood pressure checked: ? Every 3-5 years if you are 21-44 years of age. ? Every year if you are 59 years old or older. Diabetes Have regular diabetes screenings. This checks your fasting blood sugar level. Have the screening done:  Once every three years after age 56 if you are at a normal weight and have a low risk for diabetes.  More often and at a younger age if you are overweight or have a high risk for diabetes. What should I know about preventing infection? Hepatitis B If you have a higher risk for hepatitis B, you should be screened for this virus. Talk with your health care provider to find out if you are at risk for hepatitis B infection. Hepatitis C Testing is recommended for:  Everyone born from [birth year] through 1967.  Anyone with known risk factors for hepatitis C. Sexually transmitted infections (STIs)  Get screened for STIs, including gonorrhea and chlamydia, if: ? You are sexually active and are younger than 76 years of age. ?  You are older than 76 years of age and your health care provider tells you that you are at risk for this type of infection. ? Your sexual activity has changed since you were last screened, and you are at increased risk for chlamydia or gonorrhea. Ask your health care provider if you are at risk.  Ask your health care provider about whether you are at high risk for HIV. Your health care provider may recommend a prescription medicine to  help prevent HIV infection. If you choose to take medicine to prevent HIV, you should first get tested for HIV. You should then be tested every 3 months for as long as you are taking the medicine. Pregnancy  If you are about to stop having your period (premenopausal) and you may become pregnant, seek counseling before you get pregnant.  Take 400 to 800 micrograms (mcg) of folic acid every day if you become pregnant.  Ask for birth control (contraception) if you want to prevent pregnancy. Osteoporosis and menopause Osteoporosis is a disease in which the bones lose minerals and strength with aging. This can result in bone fractures. If you are 23 years old or older, or if you are at risk for osteoporosis and fractures, ask your health care provider if you should:  Be screened for bone loss.  Take a calcium or vitamin D supplement to lower your risk of fractures.  Be given hormone replacement therapy (HRT) to treat symptoms of menopause. Follow these instructions at home: Lifestyle  Do not use any products that contain nicotine or tobacco, such as cigarettes, e-cigarettes, and chewing tobacco. If you need help quitting, ask your health care provider.  Do not use street drugs.  Do not share needles.  Ask your health care provider for help if you need support or information about quitting drugs. Alcohol use  Do not drink alcohol if: ? Your health care provider tells you not to drink. ? You are pregnant, may be pregnant, or are planning to become pregnant.  If you drink alcohol: ? Limit how much you use to 0-1 drink a day. ? Limit intake if you are breastfeeding.  Be aware of how much alcohol is in your drink. In the [place] one drink equals one 12 oz bottle of beer (355 mL), one 5 oz glass of wine (148 mL), or one 1 oz glass of hard liquor (44 mL). General instructions  Schedule regular health, dental, and eye exams.  Stay current with your vaccines.  Tell your health care  provider if: ? You often feel depressed. ? You have ever been abused or do not feel safe at home. Summary  Adopting a healthy lifestyle and getting preventive care are important in promoting health and wellness.  Follow your health care provider's instructions about healthy diet, exercising, and getting tested or screened for diseases.  Follow your health care provider's instructions on monitoring your cholesterol and blood pressure. This information is not intended to replace advice given to you by your health care provider. Make sure you discuss any questions you have with your health care provider. Document Revised: 10/06/2018 Document Reviewed: 10/06/2018 Elsevier Patient Education  2020 [employer].

## 2020-09-07 ENCOUNTER — Other Ambulatory Visit: Payer: Self-pay

## 2020-09-07 ENCOUNTER — Encounter: Payer: Self-pay | Admitting: Internal Medicine

## 2020-09-07 ENCOUNTER — Ambulatory Visit (INDEPENDENT_AMBULATORY_CARE_PROVIDER_SITE_OTHER): Payer: Medicare Other | Admitting: Internal Medicine

## 2020-09-07 VITALS — BP 110/60 | HR 56 | Temp 97.8°F | Ht 63.75 in | Wt 143.0 lb

## 2020-09-07 DIAGNOSIS — H6123 Impacted cerumen, bilateral: Secondary | ICD-10-CM | POA: Diagnosis not present

## 2020-09-07 DIAGNOSIS — H9193 Unspecified hearing loss, bilateral: Secondary | ICD-10-CM | POA: Diagnosis not present

## 2020-09-07 DIAGNOSIS — Z974 Presence of external hearing-aid: Secondary | ICD-10-CM

## 2020-09-07 NOTE — Progress Notes (Signed)
Chief Complaint  Patient presents with  . Other    went for hearing aid fitting was told had wax in ears.   Right ear    HPI: Laura Curtis 74 y.o. come in for help with ear cleaning.  She has hearing aids and her audiology team recommended her ears to be cleaned out for further testing.  No acute loss of hearing no Q-tips.  No ear pain.   Has some imbalance first thing in am   Takes a few minutes before feels steady .  ROS: See pertinent positives and negatives per HPI.  Past Medical History:  Diagnosis Date  . Biceps tendinopathy 01/14/2012  . 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 Chronic pain of right knee 04/15/2013   Injected April 15, 2013  . Hearing aid worn   . Hepatitis A   . Hip pain, right 12/31/2011   A mild amount of arthritis is probable with the limitation of rotation but the key finding today was weakness in abduction   . 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  [name] Hx of varicella   . HX: benign breast biopsy   . Left rotator cuff tear 01/14/2012   Supraspinatus tear noted on ultrasound. Provided instructions regarding nitroglycerin patches 1/4 patch daily to shoulder.   To continue circular motion exercises and range of motion. No over the head exercises.  No weight bearing on that side. She can continue to play tennis as tolerated.  FU in 2 weeks if no improvement, 4 weeks if improved.      . Shoulder pain, left 12/31/2011   History of rotator cuff tear on the right and now has nontraumatic left shoulder pain   . Syncope    June, 2013  . Uterine prolapse     Family History  Problem Relation Age of Onset  . Stroke Father        age 34  . Other Mother        old age died 87   . Deep vein thrombosis Mother     Social History   Socioeconomic History  . Marital status: Married    Spouse name: Not on file  . Number of children: Not on file  . Years of education: Not on file  . Highest education level: Not on file  Occupational History  . Not  on file  Tobacco Use  . Smoking status: Never Smoker  . Smokeless tobacco: Never Used  Vaping Use  . Vaping Use: Never used  Substance and Sexual Activity  . Alcohol use: Yes    Alcohol/week: 2.0 - 3.0 standard drinks    Types: 2 - 3 Standard drinks or equivalent per week    Comment: occ  . Drug use: No  . Sexual activity: Not Currently    Birth control/protection: None, Surgical    Comment: BTL  Other Topics Concern  . 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 Sees dentist on a regular basis.    etoh 3 x per week                Social Determinants of Health  Financial Resource Strain:   . Difficulty of Paying Living Expenses: Not on file  Food Insecurity:   . Worried About Charity fundraiser in the Last Year: Not on file  . Ran Out of Food in the Last Year: Not on file  Transportation Needs:   . Lack of Transportation (Medical): Not on file  . Lack of Transportation (Non-Medical): Not on file  Physical Activity:   . Days of Exercise per Week: Not on file  . Minutes of Exercise per Session: Not on file  Stress:   . Feeling of Stress : Not on file  Social Connections:   . Frequency of Communication with Friends and Family: Not on file  . Frequency of Social Gatherings with Friends and Family: Not on file  . Attends Religious Services: Not on file  . Active Member of Clubs or Organizations: Not on file  . Attends Archivist Meetings: Not on file  . Marital Status: Not on file    Outpatient Medications Prior to Visit  Medication Sig Dispense Refill  . estradiol (ESTRACE) 0.1 MG/GM vaginal cream 1 gram vaginally twice weekly 42.5 g 4   No facility-administered medications prior to visit.     EXAM:  BP 110/60   Pulse (!) 56   Temp 97.8 F (36.6 C) (Oral)    Ht 5' 3.75" (1.619 m)   Wt 143 lb (64.9 kg)   LMP 10/28/1995   SpO2 98%   BMI 24.74 kg/m   Body mass index is 24.74 kg/m.  GENERAL: vitals reviewed and listed above, alert, oriented, appears well hydrated and in no acute distress HEENT: atraumatic, conjunctiva  clear, no obvious abnormalities on inspection of external nose and ears  1 ++2  Wax in ear canca;  Tm visualized  r more than left  OP : masked both ears flushed out very small amount left and right ear moved out of the way with curette left EAC flushed TM is intact.  Tolerated well. PSYCH: pleasant and cooperative, no obvious depression or anxiety Lab Results  Component Value Date   WBC 7.3 07/14/2018   HGB 14.3 07/14/2018   HCT 41.7 07/14/2018   PLT 245.0 07/14/2018   GLUCOSE 91 07/14/2018   CHOL 222 (H) 07/14/2018   TRIG 88.0 07/14/2018   HDL 64.80 07/14/2018   LDLDIRECT 138.7 07/27/2012   LDLCALC 139 (H) 07/14/2018   ALT 18 07/14/2018   AST 19 07/14/2018   NA 137 07/14/2018   K 4.0 07/14/2018   CL 101 07/14/2018   CREATININE 0.65 07/14/2018   BUN 16 07/14/2018   CO2 31 07/14/2018   TSH 4.18 08/31/2018   BP Readings from Last 3 Encounters:  09/07/20 110/60  06/27/20 118/76  02/28/20 106/64    ASSESSMENT AND PLAN:  Discussed the following assessment and plan:  Bilateral impacted cerumen - flushed and curetted   Hearing aid worn  Bilateral hearing loss, unspecified hearing loss type  -Patient advised to return or notify health care team  if  new concerns arise.  There are no Patient Instructions on file for this visit.   Standley Brooking. Jennilyn Esteve M.D.

## 2020-09-26 ENCOUNTER — Telehealth: Payer: Self-pay | Admitting: Internal Medicine

## 2020-09-26 NOTE — Telephone Encounter (Signed)
Spoke with pt to schedule AWV  Pt declined can try next year

## 2020-10-22 NOTE — Progress Notes (Signed)
Virtual Visit via Video Note  I connected with@ on 10/23/20 at  3:00 PM EST by a video enabled telemedicine application and verified that I am speaking with the correct person using two identifiers. Location patient: home Location provider:work office Persons participating in the virtual visit: patient, provider  [place] national recommendations  regarding COVID 19 pandemic   video visit is advised over in office visit for this patient.  Patient aware  of the limitations of evaluation and management by telemedicine and  availability of in person appointments. and agreed to proceed.   HPI: Laura Curtis presents for video visit has been having a problem with hand arthritis for quite a while difficulty opening jars a time however over the last few weeks she is having increasing problems especially with the right hand right thumb thenar area no specific injury or falling.   Topical voltaren  For one day and some help . But now left foot   Around ankle  wakin gup at night   . Top of left foot without injury not related to motion more deep than that no history of same or injury. No systemic symptoms with this  ROS: See pertinent positives and negatives per HPI.  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   Hx of varicella    HX: benign breast biopsy    Left rotator cuff tear 01/14/2012   Supraspinatus tear noted on ultrasound. Provided instructions regarding nitroglycerin patches 1/4 patch daily to shoulder.   To continue circular motion exercises and range of motion. No over the head exercises.  No weight  bearing on that side. She can continue to play tennis as tolerated.  FU in 2 weeks if no improvement, 4 weeks if improved.       Shoulder pain, left 12/31/2011   History of rotator cuff tear on the right and now has nontraumatic left shoulder pain    Syncope    June, 2013   Uterine prolapse     Past Surgical History:  Procedure Laterality Date   [place]     for squamous cell ca left nose   OVARIAN CYST SURGERY  1968   REPLACEMENT TOTAL KNEE Right    ROTATOR CUFF REPAIR  2001   TONSILLECTOMY AND ADENOIDECTOMY     TUBAL LIGATION      Family History  Problem Relation Age of Onset   Stroke Father        age 38   Other Mother        old age died 52    Deep vein thrombosis Mother     Social History   Tobacco Use   Smoking status: Never Smoker   Smokeless tobacco: Never Used  Vaping Use   Vaping Use: Never used  Substance Use Topics   Alcohol use: Yes    Alcohol/week: 2.0 - 3.0 standard drinks    Types: 2 - 3 Standard drinks or equivalent per week  Comment: occ   Drug use: No      Current Outpatient Medications:    meloxicam (MOBIC) 7.5 MG tablet, Take 1 tablet (7.5 mg total) by mouth daily. May increase to twice a day  if needed, Disp: 30 tablet, Rfl: 1   estradiol (ESTRACE) 0.1 MG/GM vaginal cream, 1 gram vaginally twice weekly, Disp: 42.5 g, Rfl: 4  EXAM: BP Readings from Last 3 Encounters:  09/07/20 110/60  06/27/20 118/76  02/28/20 106/64    VITALS per patient if applicable:  GENERAL: alert, oriented, appears well and in no acute distress  HEENT: atraumatic, conjunttiva clear, no obvious abnormalities on inspection of external nose and ears  NECK: normal movements of the head and neck  Normal respirations no obvious cyanosis. MS: moves all visible extremities without noticeable abnormality some deformity fingers thumb possibly DJD joint changes left foot with some swelling on the top  of dorsum of her foot and anterior ankle.  But good range of motion.  No acute redness or warmth obvious  PSYCH/NEURO: pleasant and cooperative, no obvious depression or anxiety, speech and thought processing grossly intact Lab Results  Component Value Date   WBC 7.3 07/14/2018   HGB 14.3 07/14/2018   HCT 41.7 07/14/2018   PLT 245.0 07/14/2018   GLUCOSE 91 07/14/2018   CHOL 222 (H) 07/14/2018   TRIG 88.0 07/14/2018   HDL 64.80 07/14/2018   LDLDIRECT 138.7 07/27/2012   LDLCALC 139 (H) 07/14/2018   ALT 18 07/14/2018   AST 19 07/14/2018   NA 137 07/14/2018   K 4.0 07/14/2018   CL 101 07/14/2018   CREATININE 0.65 07/14/2018   BUN 16 07/14/2018   CO2 31 07/14/2018   TSH 4.18 08/31/2018    ASSESSMENT AND PLAN:  Discussed the following assessment and plan:    ICD-10-CM   1. 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 Foot pain, left  M79.672    Hand pain arthritis suspected osteo uncertain if other.  Underlying causes Foot pain without trauma or overuse obvious consider the same consider gout less likely Can continue topical Voltaren trial of 10 days or so of anti-inflammatory meloxicam We will check back with [place],   consider more evaluation rheumatologic or orthopedic depending. She has gotten some relief with the topical Voltaren on the hand at this time. Counseled.   Expectant management and discussion of plan and treatment with opportunity to ask questions and all were answered. The patient agreed with the plan and demonstrated an understanding of the instructions.  30 minute review plan and eval  Counsel  Advised to call back or seek an in-person evaluation if worsening  or having  further concerns . Return if symptoms worsen or fail to improve, for and update in 10 - 14 days .    Berniece Andreas, MD

## 2020-10-23 ENCOUNTER — Telehealth (INDEPENDENT_AMBULATORY_CARE_PROVIDER_SITE_OTHER): Payer: Medicare Other | Admitting: Internal Medicine

## 2020-10-23 ENCOUNTER — Encounter: Payer: Self-pay | Admitting: Internal Medicine

## 2020-10-23 DIAGNOSIS — M19049 Primary osteoarthritis, unspecified hand: Secondary | ICD-10-CM

## 2020-10-23 DIAGNOSIS — M79672 Pain in left foot: Secondary | ICD-10-CM | POA: Diagnosis not present

## 2020-10-23 MED ORDER — ESTRADIOL 0.1 MG/GM VA CREA: TOPICAL_CREAM | VAGINAL | 4 refills | Status: DC

## 2020-10-23 MED ORDER — MELOXICAM 7.5 MG PO TABS: 7.5000 mg | ORAL_TABLET | Freq: Every day | ORAL | 1 refills | Status: DC

## 2020-12-12 NOTE — Progress Notes (Signed)
Virtual Visit via Video Note  I connected with@ on2 17 22 at  8:30 AM EST by a video enabled telemedicine application and verified that I am speaking with the correct person using two identifiers. Location patient: home Location provider: home office Persons participating in the virtual visit: patient, provider  [place] national recommendations  regarding COVID 19 pandemic   video visit is advised over in office visit for this patient.  Patient aware  of the limitations of evaluation and management by telemedicine and  availability of in person appointments. and agreed to proceed.   HPI: Laura Curtis presents for video visit Following up for hand arthritis pain medication given Mobic.  Taking 7.5 a day had minimal help but when took 15 mg a day had significant relief of symptoms.  She is running out of medicine and some of her symptoms of come back No side effects of medicine asked if she could or should get refills on how to manage. No new symptoms.  No history of bleeding ulcers hypertension.     ROS: See pertinent positives and negatives per HPI.  Past Medical History:  Diagnosis Date  . Biceps tendinopathy 01/14/2012  . 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 Chronic pain of right knee 04/15/2013   Injected April 15, 2013  . Hearing aid worn   . Hepatitis A   . Hip pain, right 12/31/2011   A mild amount of arthritis is probable with the limitation of rotation but the key finding today was weakness in abduction   . 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  [name] Hx of varicella   . HX: benign breast biopsy   . Left rotator cuff tear 01/14/2012   Supraspinatus tear noted on ultrasound. Provided instructions regarding nitroglycerin patches 1/4 patch daily to shoulder.   To continue circular motion exercises and range of motion. No over the head exercises.  No weight bearing on that side. She can continue to play tennis as tolerated.  FU in 2 weeks if no improvement, 4 weeks if  improved.      . Shoulder pain, left 12/31/2011   History of rotator cuff tear on the right and now has nontraumatic left shoulder pain   . Syncope    June, 2013  . Uterine prolapse     Past Surgical History:  Procedure Laterality Date  . APPENDECTOMY  1968  . BREAST BIOPSY  1998  . MOHS SURGERY     for squamous cell ca left nose  . OVARIAN CYST SURGERY  1968  . REPLACEMENT TOTAL KNEE Right   . ROTATOR CUFF REPAIR  2001  . TONSILLECTOMY AND ADENOIDECTOMY    . TUBAL LIGATION      Family History  Problem Relation Age of Onset  . Stroke Father        age 38  . Other Mother        old age died 13   . Deep vein thrombosis Mother     Social History   Tobacco Use  . Smoking status: Never Smoker  . Smokeless tobacco: Never Used  Vaping Use  . Vaping Use: Never used  Substance Use Topics  . Alcohol use: Yes    Alcohol/week: 2.0 - 3.0 standard drinks    Types: 2 - 3 Standard drinks or equivalent per week    Comment: occ  . Drug use: No      Current Outpatient Medications:  .  estradiol (ESTRACE) 0.1 MG/GM vaginal cream, 1 gram vaginally twice weekly, Disp: 42.5 g, Rfl: 4 .  meloxicam (MOBIC) 7.5 MG tablet, Take 1-2 tablets (7.5-15 mg total) by mouth daily. As directed, Disp: 60 tablet, Rfl: 1  EXAM: BP Readings from Last 3 Encounters:  09/07/20 110/60  06/27/20 118/76  02/28/20 106/64    VITALS per patient if applicable:  GENERAL: alert, oriented, appears well and in no acute distress  HEENT: atraumatic, conjunttiva clear, no obvious abnormalities on inspection of external nose and ears  NECK: normal movements of the head and neck  LUNGS: on inspection no signs of respiratory distress, breathing rate appears normal, no obvious gross SOB, gasping or wheezing  CV: no obvious cyanosis  MS: moves all visible extremities without noticeable abnormality  PSYCH/NEURO: pleasant and cooperative, no obvious depression or anxiety, speech and thought processing grossly  intact Lab Results  Component Value Date   WBC 7.3 07/14/2018   HGB 14.3 07/14/2018   HCT 41.7 07/14/2018   PLT 245.0 07/14/2018   GLUCOSE 91 07/14/2018   CHOL 222 (H) 07/14/2018   TRIG 88.0 07/14/2018   HDL 64.80 07/14/2018   LDLDIRECT 138.7 07/27/2012   LDLCALC 139 (H) 07/14/2018   ALT 18 07/14/2018   AST 19 07/14/2018   NA 137 07/14/2018   K 4.0 07/14/2018   CL 101 07/14/2018   CREATININE 0.65 07/14/2018   BUN 16 07/14/2018   CO2 31 07/14/2018   TSH 4.18 08/31/2018    ASSESSMENT AND PLAN:  Discussed the following assessment and plan:    ICD-10-CM   1. 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 Medication management  Z79.899 Comprehensive metabolic panel    C-reactive protein    CBC with Differential/Platelet    Cyclic citrul peptide antibody, IgG    Rheumatoid Factor    ANA    Sedimentation rate   Reasonable to continue Mobic least amount needed can take higher dose to subside symptoms and then lower dose as suppression.  Potential risk benefit discussed.  Plan lab work to include renal function arthritis markers and hand x-ray.  In the next few weeks.  If ongoing or concerns we can consult with rheumatology but she is doing better at this time. Refilled medicine today.  To take 1 or 2 a day as needed for suppression. Counseled.   Expectant management and discussion of plan and treatment with opportunity to ask questions and all were answered. The patient agreed with the plan and demonstrated an understanding of the instructions.   Advised to call back or seek an in-person evaluation if worsening  or having  further concerns . Return for lab work and x ray   then plan fu every 4-6 mos depending.   Shanon Ace, MD

## 2020-12-13 ENCOUNTER — Telehealth (INDEPENDENT_AMBULATORY_CARE_PROVIDER_SITE_OTHER): Payer: Medicare Other | Admitting: Internal Medicine

## 2020-12-13 ENCOUNTER — Encounter: Payer: Self-pay | Admitting: Internal Medicine

## 2020-12-13 DIAGNOSIS — M19049 Primary osteoarthritis, unspecified hand: Secondary | ICD-10-CM

## 2020-12-13 DIAGNOSIS — Z79899 Other long term (current) drug therapy: Secondary | ICD-10-CM | POA: Diagnosis not present

## 2020-12-13 MED ORDER — MELOXICAM 7.5 MG PO TABS: 7.5000 mg | ORAL_TABLET | Freq: Every day | ORAL | 1 refills | Status: DC

## 2020-12-24 ENCOUNTER — Other Ambulatory Visit (INDEPENDENT_AMBULATORY_CARE_PROVIDER_SITE_OTHER): Payer: Medicare Other

## 2020-12-24 ENCOUNTER — Other Ambulatory Visit: Payer: Self-pay

## 2020-12-24 ENCOUNTER — Ambulatory Visit (INDEPENDENT_AMBULATORY_CARE_PROVIDER_SITE_OTHER): Payer: Medicare Other

## 2020-12-24 DIAGNOSIS — M19042 Primary osteoarthritis, left hand: Secondary | ICD-10-CM | POA: Diagnosis not present

## 2020-12-24 DIAGNOSIS — M11241 Other chondrocalcinosis, right hand: Secondary | ICD-10-CM | POA: Diagnosis not present

## 2020-12-24 DIAGNOSIS — M19049 Primary osteoarthritis, unspecified hand: Secondary | ICD-10-CM

## 2020-12-24 DIAGNOSIS — M11242 Other chondrocalcinosis, left hand: Secondary | ICD-10-CM | POA: Diagnosis not present

## 2020-12-24 DIAGNOSIS — M19041 Primary osteoarthritis, right hand: Secondary | ICD-10-CM | POA: Diagnosis not present

## 2020-12-24 DIAGNOSIS — M19032 Primary osteoarthritis, left wrist: Secondary | ICD-10-CM | POA: Diagnosis not present

## 2020-12-24 DIAGNOSIS — Z79899 Other long term (current) drug therapy: Secondary | ICD-10-CM | POA: Diagnosis not present

## 2020-12-24 DIAGNOSIS — M19031 Primary osteoarthritis, right wrist: Secondary | ICD-10-CM | POA: Diagnosis not present

## 2020-12-24 LAB — CBC WITH DIFFERENTIAL/PLATELET
Basophils Absolute: 0 10*3/uL (ref 0.0–0.1)
Basophils Relative: 0.7 % (ref 0.0–3.0)
Eosinophils Absolute: 0.1 10*3/uL (ref 0.0–0.7)
Eosinophils Relative: 0.9 % (ref 0.0–5.0)
HCT: 40 % (ref 36.0–46.0)
Hemoglobin: 14 g/dL (ref 12.0–15.0)
Lymphocytes Relative: 25.8 % (ref 12.0–46.0)
Lymphs Abs: 1.9 10*3/uL (ref 0.7–4.0)
MCHC: 35 g/dL (ref 30.0–36.0)
MCV: 86.1 fl (ref 78.0–100.0)
Monocytes Absolute: 0.5 10*3/uL (ref 0.1–1.0)
Monocytes Relative: 7.2 % (ref 3.0–12.0)
Neutro Abs: 4.8 10*3/uL (ref 1.4–7.7)
Neutrophils Relative %: 65.4 % (ref 43.0–77.0)
Platelets: 250 10*3/uL (ref 150.0–400.0)
RBC: 4.64 Mil/uL (ref 3.87–5.11)
RDW: 12.7 % (ref 11.5–15.5)
WBC: 7.3 10*3/uL (ref 4.0–10.5)

## 2020-12-24 LAB — COMPREHENSIVE METABOLIC PANEL
ALT: 18 U/L (ref 0–35)
AST: 15 U/L (ref 0–37)
Albumin: 4.2 g/dL (ref 3.5–5.2)
Alkaline Phosphatase: 90 U/L (ref 39–117)
BUN: 20 mg/dL (ref 6–23)
CO2: 23 mEq/L (ref 19–32)
Calcium: 9.1 mg/dL (ref 8.4–10.5)
Chloride: 107 mEq/L (ref 96–112)
Creatinine, Ser: 0.81 mg/dL (ref 0.40–1.20)
GFR: 70.2 mL/min (ref >60.00)
Glucose, Bld: 94 mg/dL (ref 70–99)
Potassium: 4.1 mEq/L (ref 3.5–5.1)
Sodium: 140 mEq/L (ref 135–145)
Total Bilirubin: 0.4 mg/dL (ref 0.2–1.2)
Total Protein: 6.5 g/dL (ref 6.0–8.3)

## 2020-12-24 LAB — C-REACTIVE PROTEIN: CRP: <1 mg/dL (ref 0.5–20.0)

## 2020-12-24 LAB — SEDIMENTATION RATE: Sed Rate: 5 mm/hr (ref 0–30)

## 2020-12-26 LAB — CYCLIC CITRUL PEPTIDE ANTIBODY, IGG: Cyclic Citrullin Peptide Ab: <16 UNITS

## 2020-12-26 LAB — RHEUMATOID FACTOR: Rheumatoid fact SerPl-aCnc: <14 IU/mL (ref <14)

## 2020-12-26 LAB — ANA: Anti Nuclear Antibody (ANA): NEGATIVE

## 2020-12-26 NOTE — Progress Notes (Signed)
Blood results for serology screening and inflammation are negative see comments under the x-ray report.

## 2020-12-26 NOTE — Progress Notes (Signed)
So inflammatory markers are all negative do not point to rheumatoid type problem.  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 We will continu with management that seems to work but if progressive and problem we can refer to rheumatology.

## 2020-12-27 ENCOUNTER — Telehealth (INDEPENDENT_AMBULATORY_CARE_PROVIDER_SITE_OTHER): Payer: Medicare Other | Admitting: Family Medicine

## 2020-12-27 DIAGNOSIS — R438 Other disturbances of smell and taste: Secondary | ICD-10-CM | POA: Diagnosis not present

## 2020-12-27 DIAGNOSIS — R0789 Other chest pain: Secondary | ICD-10-CM

## 2020-12-27 DIAGNOSIS — K1379 Other lesions of oral mucosa: Secondary | ICD-10-CM

## 2020-12-27 NOTE — Progress Notes (Signed)
Virtual Visit via Video Note  I connected with Laura Curtis  on 12/27/20 at  4:00 PM EST by a video enabled telemedicine application and verified that I am speaking with the correct person using two identifiers.  Location patient: home, [place] Location provider:work or home office Persons participating in the virtual visit: patient, provider  I discussed the limitations of evaluation and management by telemedicine and the availability of in person appointments. The patient expressed understanding and agreed to proceed.   HPI:  Acute telemedicine visit for : -Onset: 3 days ago -Symptoms include: brief episode of nausea and chest pain, R mouth discomfort and weird taste in her mouth that woke her up while she was sleeping 3 nights ago -symptoms resolved and has had none since -plays pickle ball, goes to gym 3x per week, does yoga several days per week and never has had any symptoms with this -has had zero symptoms this week with activities or at any other time -Denies: recurrent symptoms, CP, SOB or any other symptoms currently -Pertinent past medical history: none, see below  ROS: See pertinent positives and negatives per HPI.  Past Medical History:  Diagnosis Date  . Biceps tendinopathy 01/14/2012  . 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 Chronic pain of right knee 04/15/2013   Injected April 15, 2013  . Hearing aid worn   . Hepatitis A   . Hip pain, right 12/31/2011   A mild amount of arthritis is probable with the limitation of rotation but the key finding today was weakness in abduction   . 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  [name] Hx of varicella   . HX: benign breast biopsy   . Left rotator cuff tear 01/14/2012   Supraspinatus tear noted on ultrasound. Provided instructions regarding nitroglycerin patches 1/4 patch daily to shoulder.   To continue circular motion exercises and range of motion. No over the head exercises.  No weight bearing on that side. She can continue to  play tennis as tolerated.  FU in 2 weeks if no improvement, 4 weeks if improved.      . Shoulder pain, left 12/31/2011   History of rotator cuff tear on the right and now has nontraumatic left shoulder pain   . Syncope    June, 2013  . Uterine prolapse     Past Surgical History:  Procedure Laterality Date  . APPENDECTOMY  1968  . BREAST BIOPSY  1998  . MOHS SURGERY     for squamous cell ca left nose  . OVARIAN CYST SURGERY  1968  . REPLACEMENT TOTAL KNEE Right   . ROTATOR CUFF REPAIR  2001  . TONSILLECTOMY AND ADENOIDECTOMY    . TUBAL LIGATION       Current Outpatient Medications:  .  estradiol (ESTRACE) 0.1 MG/GM vaginal cream, 1 gram vaginally twice weekly, Disp: 42.5 g, Rfl: 4 .  meloxicam (MOBIC) 7.5 MG tablet, Take 1-2 tablets (7.5-15 mg total) by mouth daily. As directed, Disp: 60 tablet, Rfl: 1  EXAM:  VITALS per patient if applicable:  GENERAL: alert, oriented, appears well and in no acute distress  HEENT: atraumatic, conjunttiva clear, no obvious abnormalities on inspection of external nose and ears  NECK: normal movements of the head and neck  LUNGS: on inspection no signs of respiratory distress, breathing rate appears normal, no obvious gross SOB, gasping or wheezing  CV: no obvious cyanosis  MS: moves all visible extremities without noticeable abnormality  PSYCH/NEURO: pleasant and cooperative, no obvious depression or anxiety, speech and thought processing grossly intact  ASSESSMENT AND PLAN:  Discussed the following assessment and plan:  Bad taste in mouth  Chest discomfort  Mouth pain  -we discussed possible serious and likely etiologies, options for evaluation and workup, limitations of telemedicine visit vs in person visit, treatment, treatment risks and precautions. Pt prefers to treat via telemedicine empirically rather than in person at this moment.  Query acid reflux versus other.  Discussed treatments for acid reflux.  She was wondering if  this could be heart related.  Seems less likely given her level of fitness, no symptoms with activity even since this event and that this occurred at night while she was sleeping, however did advise that she schedule an in person visit with her primary care office or go to urgent care so that they can check her heart.  She agreed to set up a visit with her primary care office. Scheduled follow up with PCP offered: She plans to schedule follow-up with her PCP. Advised to seek prompt in person care if worsening, new symptoms arise, or if is not improving with treatment. Discussed options for inperson care if PCP office not available. Did let this patient know that I only do telemedicine on Tuesdays and Thursdays for [place]. Advised to schedule follow up visit with PCP or [facility] if any further questions or concerns to avoid delays in care.   I discussed the assessment and treatment plan with the patient. The patient was provided an opportunity to ask questions and all were answered. The patient agreed with the plan and demonstrated an understanding of the instructions.     Lucretia Kern, DO

## 2021-01-21 ENCOUNTER — Encounter: Payer: Self-pay | Admitting: Internal Medicine

## 2021-01-21 ENCOUNTER — Other Ambulatory Visit: Payer: Self-pay

## 2021-01-21 ENCOUNTER — Ambulatory Visit (INDEPENDENT_AMBULATORY_CARE_PROVIDER_SITE_OTHER): Payer: Medicare Other

## 2021-01-21 ENCOUNTER — Ambulatory Visit (INDEPENDENT_AMBULATORY_CARE_PROVIDER_SITE_OTHER): Payer: Medicare Other | Admitting: Internal Medicine

## 2021-01-21 VITALS — BP 116/66 | HR 58 | Temp 97.6°F | Ht 63.75 in | Wt 146.2 lb

## 2021-01-21 DIAGNOSIS — R079 Chest pain, unspecified: Secondary | ICD-10-CM

## 2021-01-21 NOTE — Progress Notes (Signed)
Chest x-ray shows no acute findings or areas of concern :normal

## 2021-01-21 NOTE — Patient Instructions (Addendum)
ekg is normal  No sure why you are having this but reminds me of chest wall nerve pain or from   Neck shoulder.  Get chest x ray  Fu if  persistent or progressive .

## 2021-01-21 NOTE — Progress Notes (Signed)
Chief Complaint  Patient presents with  . Chest Pain    Patient complains of chest pain on upper right side of chest while sitting in her car, started yesterday,     HPI: Laura Curtis 75 y.o. come in for new sx   SDA   C/o episode of piercing ru chest pain and axilla and arm  while driving back from [facility] as passenger   .Laura Curtis  Lasted 2 hours   And elbow since home. Not excrutiating.  No numbness or weakness   r handed. Feels fine now.  Well.   Lasted   30 minutes  Awoke  With it.  Around chin also The first time   And metal tase?    Had also 1 mos ago.  ROS: See pertinent positives and negatives per HPI. 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 Biceps tendinopathy 01/14/2012  . 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 Chronic pain of right knee 04/15/2013   Injected April 15, 2013  . Hearing aid worn   . Hepatitis A   . Hip pain, right 12/31/2011   A mild amount of arthritis is probable with the limitation of rotation but the key finding today was weakness in abduction   . 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  Laura Curtis Hx of varicella   . HX: benign breast biopsy   . Left rotator cuff tear 01/14/2012   Supraspinatus tear noted on ultrasound. Provided instructions regarding nitroglycerin patches 1/4 patch daily to shoulder.   To continue circular motion exercises and range of motion. No over the head exercises.  No weight bearing on that side. She can continue to play tennis as tolerated.  FU in 2 weeks if no improvement, 4 weeks if improved.      . Shoulder pain, left 12/31/2011   History of rotator cuff tear on the right and now has nontraumatic left shoulder pain   . Syncope    June, 2013  . Uterine prolapse     Family History   Problem Relation Age of Onset  . Stroke Father        age 75  . Other Mother        old age died 62   . Deep vein thrombosis Mother     Social History   Socioeconomic History  . Marital status: Married    Spouse name: Not on file  . Number of children: Not on file  . Years of education: Not on file  . Highest education level: Not on file  Occupational History  . Not on file  Tobacco Use  . Smoking status: Never Smoker  . Smokeless tobacco: Never Used  Vaping Use  . Vaping Use: Never used  Substance and Sexual Activity  . Alcohol use: Yes    Alcohol/week: 2.0 - 3.0 standard drinks    Types: 2 - 3 Standard drinks or equivalent per week    Comment: occ  . Drug use: No  . Sexual activity: Not Currently    Birth control/protection: None, Surgical    Comment: BTL  Other Topics Concern  . Not on file  Social History Narrative  hh of 2 married      retired Chief Financial Officer.   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 Sees dentist on a regular basis.    etoh 3 x per week                Social Determinants of Health   Financial Resource Strain: Not on file  Food Insecurity: Not on file  Transportation Needs: Not on file  Physical Activity: Not on file  Stress: Not on file  Social Connections: Not on file    Outpatient Medications Prior to Visit  Medication Sig Dispense Refill  . estradiol (ESTRACE) 0.1 MG/GM vaginal cream 1 gram vaginally twice weekly 42.5 g 4  . meloxicam (MOBIC) 7.5 MG tablet Take 1-2 tablets (7.5-15 mg total) by mouth daily. As directed 60 tablet 1   No facility-administered medications prior to visit.     EXAM:  BP 116/66 (BP Location: Left Arm, Patient Position: Sitting, Cuff Size: Normal)   Pulse (!) 58   Temp 97.6 F (36.4 C) (Oral)   Ht 5' 3.75" (1.619 m)   Wt 146 lb 3.2 oz (66.3 kg)   LMP 10/28/1995   SpO2 95%   BMI 25.29  kg/m   Body mass index is 25.29 kg/m.  GENERAL: vitals reviewed and listed above, alert, oriented, appears well hydrated and in no acute distress HEENT: atraumatic, conjunctiva  clear, no obvious abnormalities on inspection of external nose and ears OP : masked  NECK: no obvious masses on inspection palpation  LUNGS: clear to auscultation bilaterally, no wheezes, rales or rhonchi, good air movement mild tenderness  Right parasternal  t2-4 area no bruising   or deformity breast exam normal axilla no masses, some tenderness on the pectoral area CV: HRRR, no clubbing cyanosis or  peripheral edema nl cap refill  MS: moves all extremities without noticeable focal  Abnormality  r shoulder old scar ac area  PSYCH: pleasant and cooperative, no obvious depression or anxiety  BP Readings from Last 3 Encounters:  01/21/21 116/66  09/07/20 110/60  06/27/20 118/76   EKG shows normal sinus rhythm no acute findings rate about 55. Chest x-ray ordered. ASSESSMENT AND PLAN:  Discussed the following assessment and plan:  Chest pain, unspecified type - Plan: DG Chest 2 View, EKG 12-Lead Reminiscent of chest wall ms pain    Follow  ekg nl and no assoc sx   No gi sx  meloxicam helps her  Hand films Does not sound like GERD or GI cause At this time since she is doing well with normal findings we will watch carefully and follow-up if recurring.  I do not think that is cardiovascular or a catastrophic cause at this time.  -Patient advised to return or notify health care team  if  new concerns arise.  Patient Instructions  ekg is normal  No sure why you are having this but reminds me of chest wall nerve pain or from   Neck shoulder.  Get chest x ray  Fu if  persistent or progressive .  Standley Brooking. Bauer Ausborn M.D.

## 2021-02-04 IMAGING — DX DG SHOULDER 2+V*L*
3 series · 3 of 3 positions shown · non-contrast
Comparison: None.

CLINICAL DATA: Bilateral shoulder pain.

EXAM:
LEFT SHOULDER - 2+ VIEW

[dg shoulder left (1 of 3)]
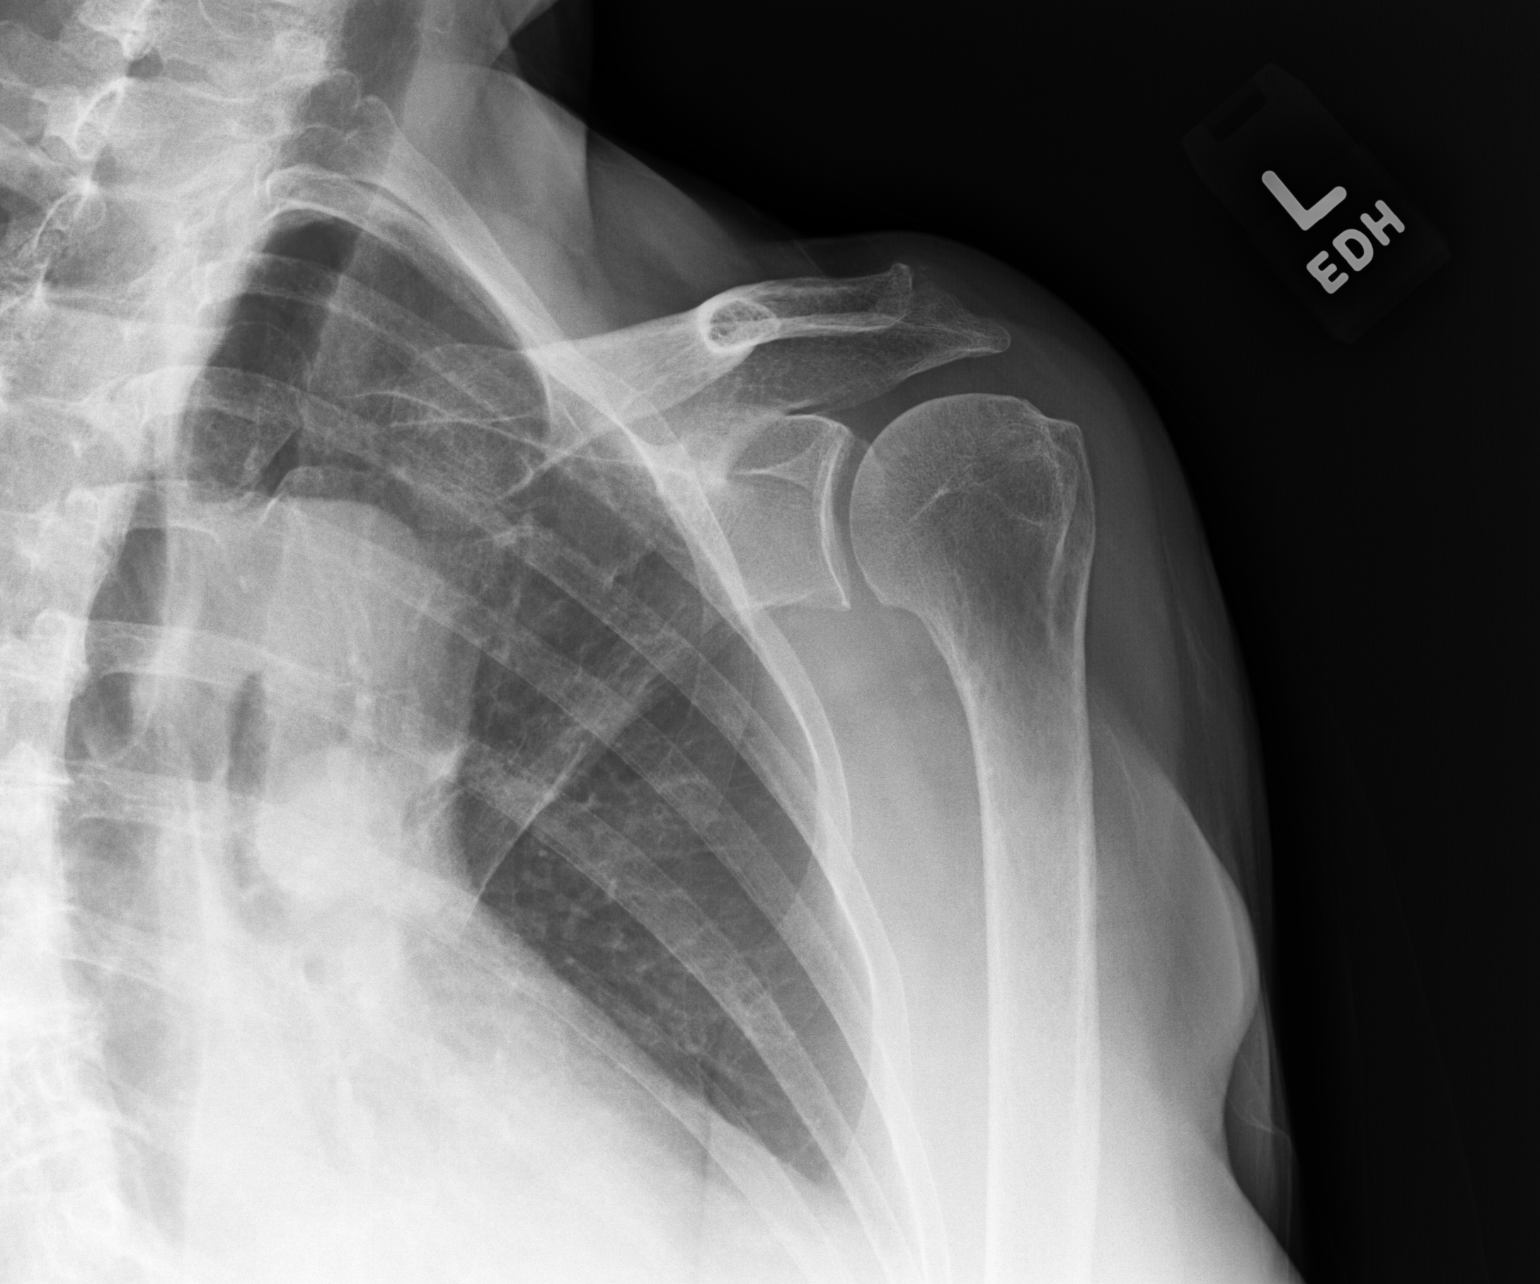

[dg shoulder left (2 of 3)]
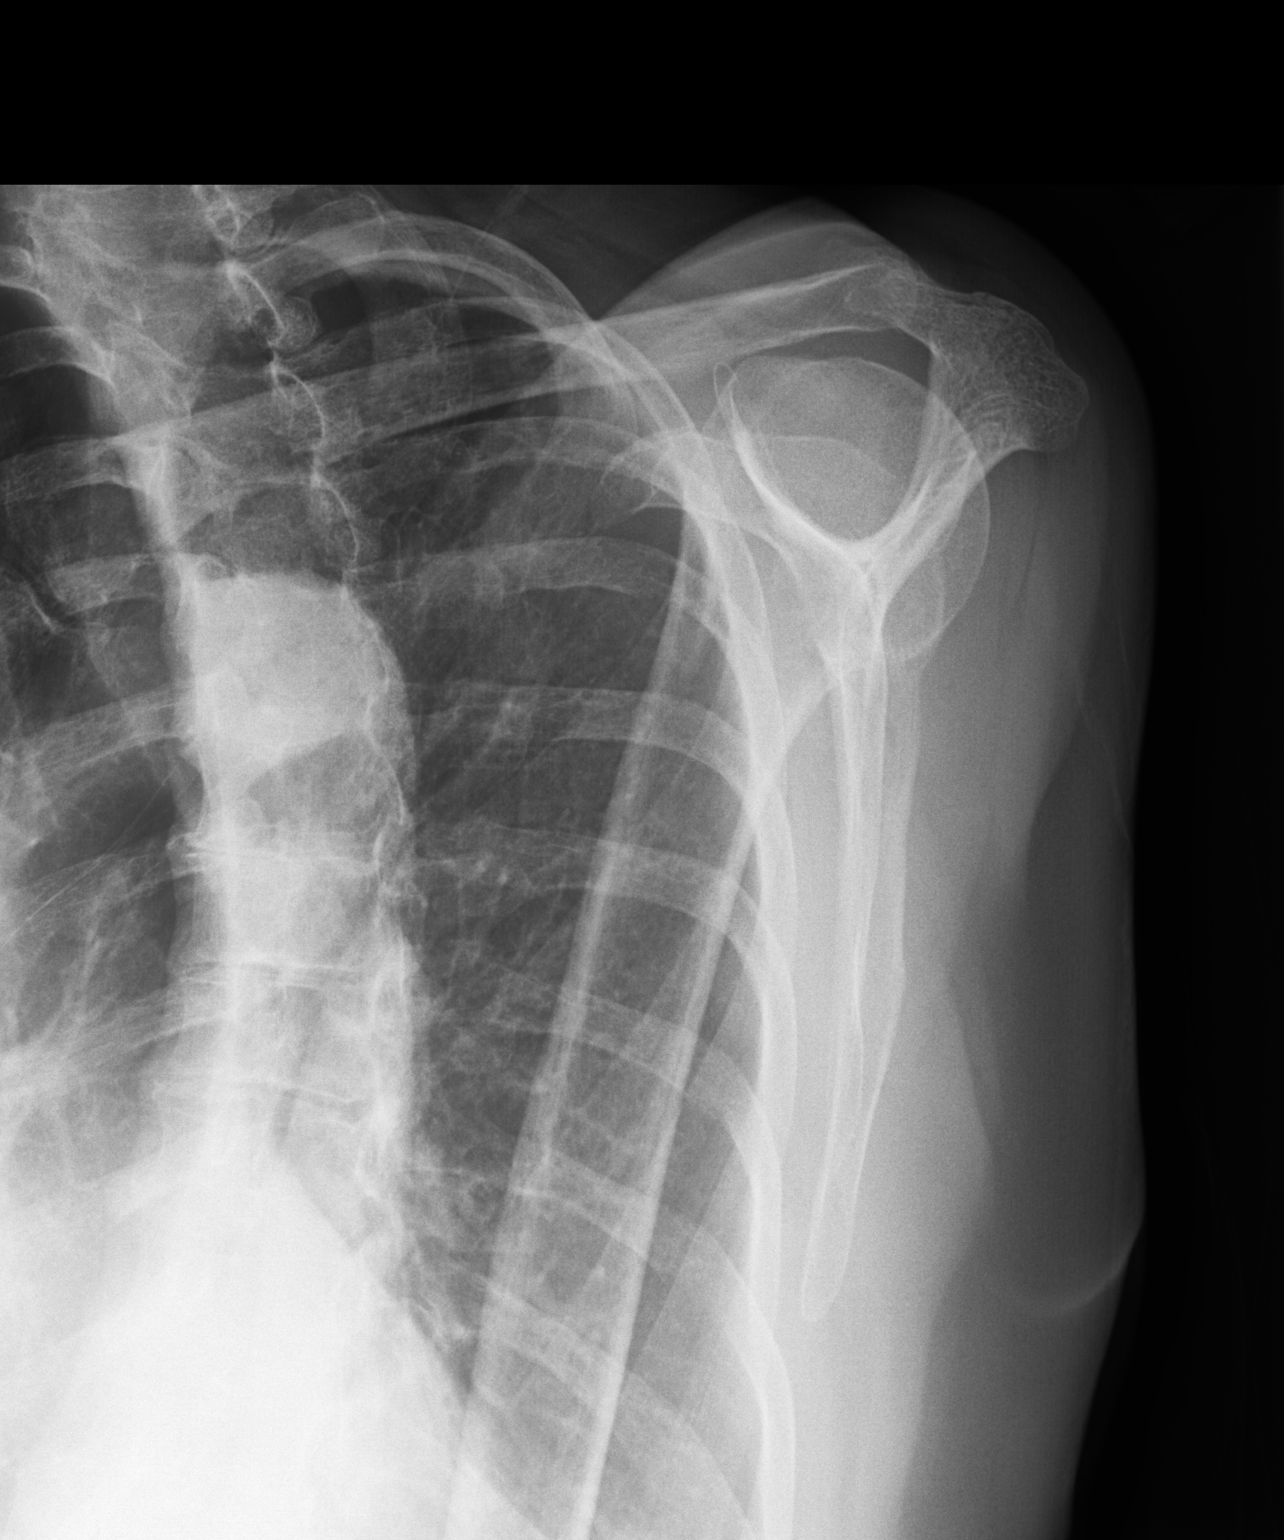

[dg shoulder left (3 of 3)]
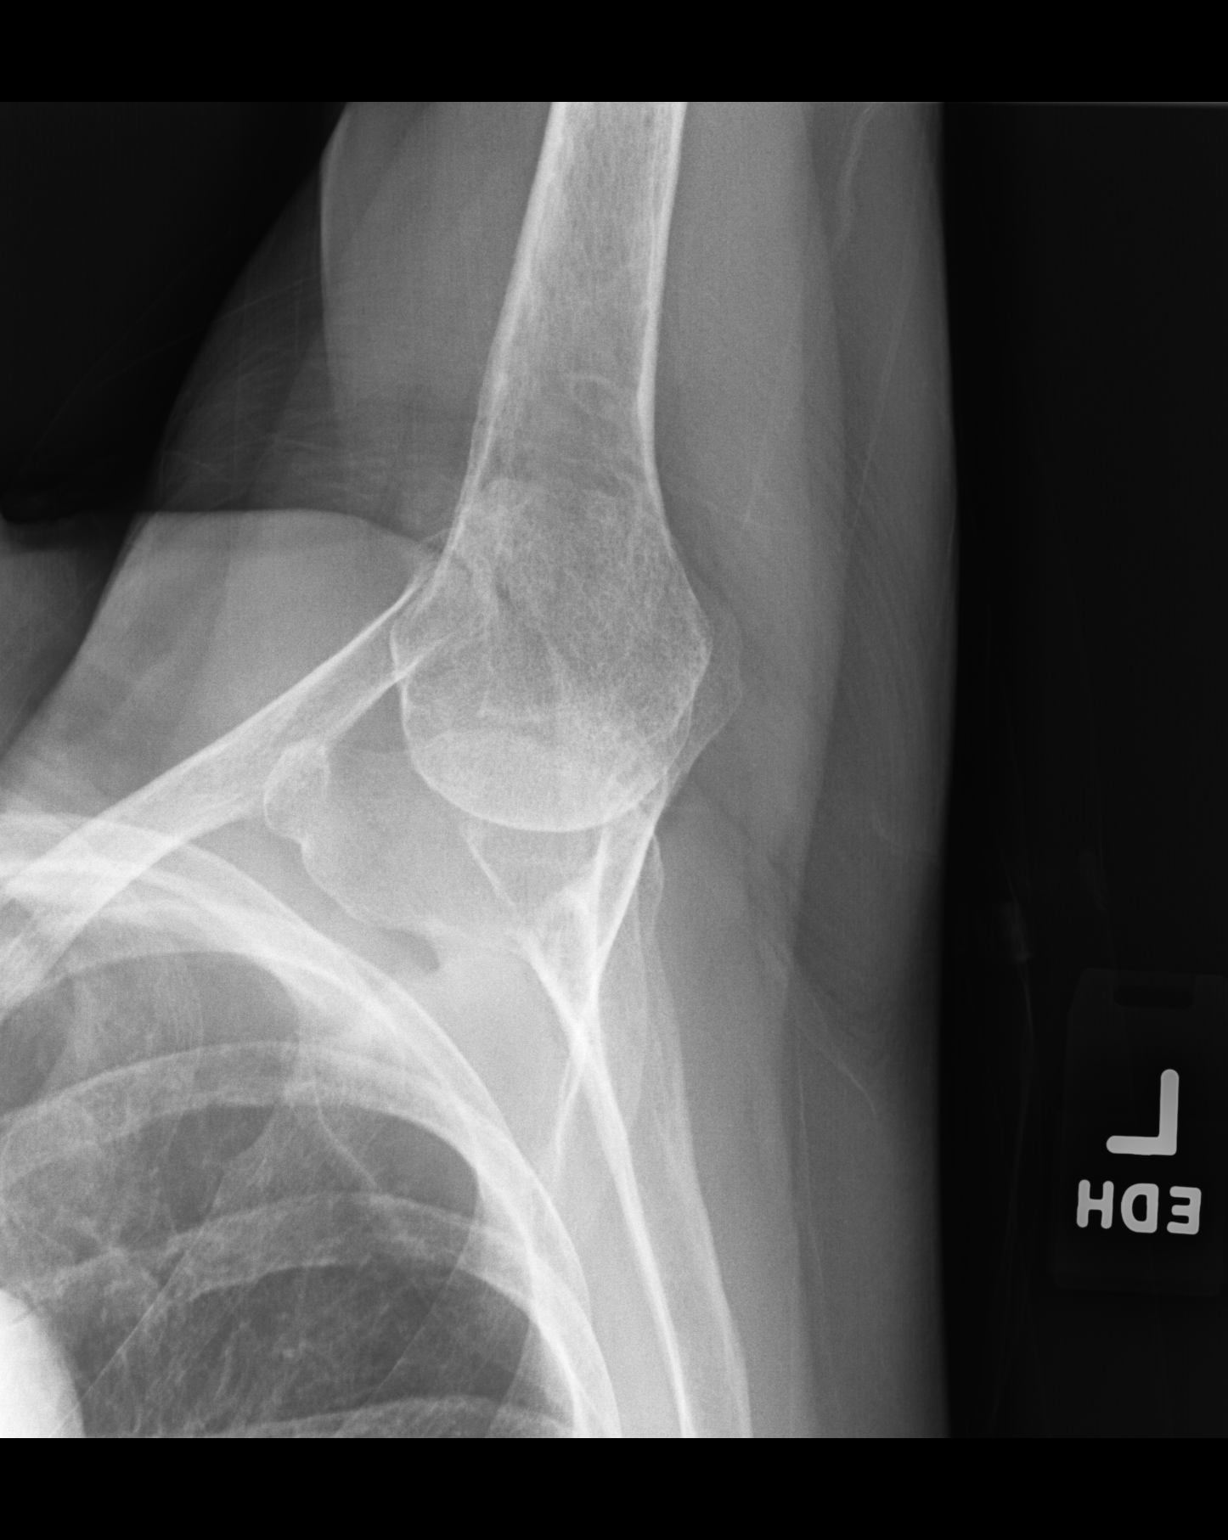

[3 of 3 positions shown; findings below may reference images not displayed]

FINDINGS: There is no evidence of fracture or dislocation. There is no
evidence of arthropathy or other focal bone abnormality. Soft
tissues are unremarkable.
IMPRESSION: Negative.

## 2021-02-04 IMAGING — DX DG SHOULDER 2+V*R*
3 series · 3 of 3 positions shown · non-contrast
Comparison: None.

CLINICAL DATA: Bilateral shoulder pain for 5 months.

EXAM:
RIGHT SHOULDER - 2+ VIEW

[dg shoulder right (1 of 3)]
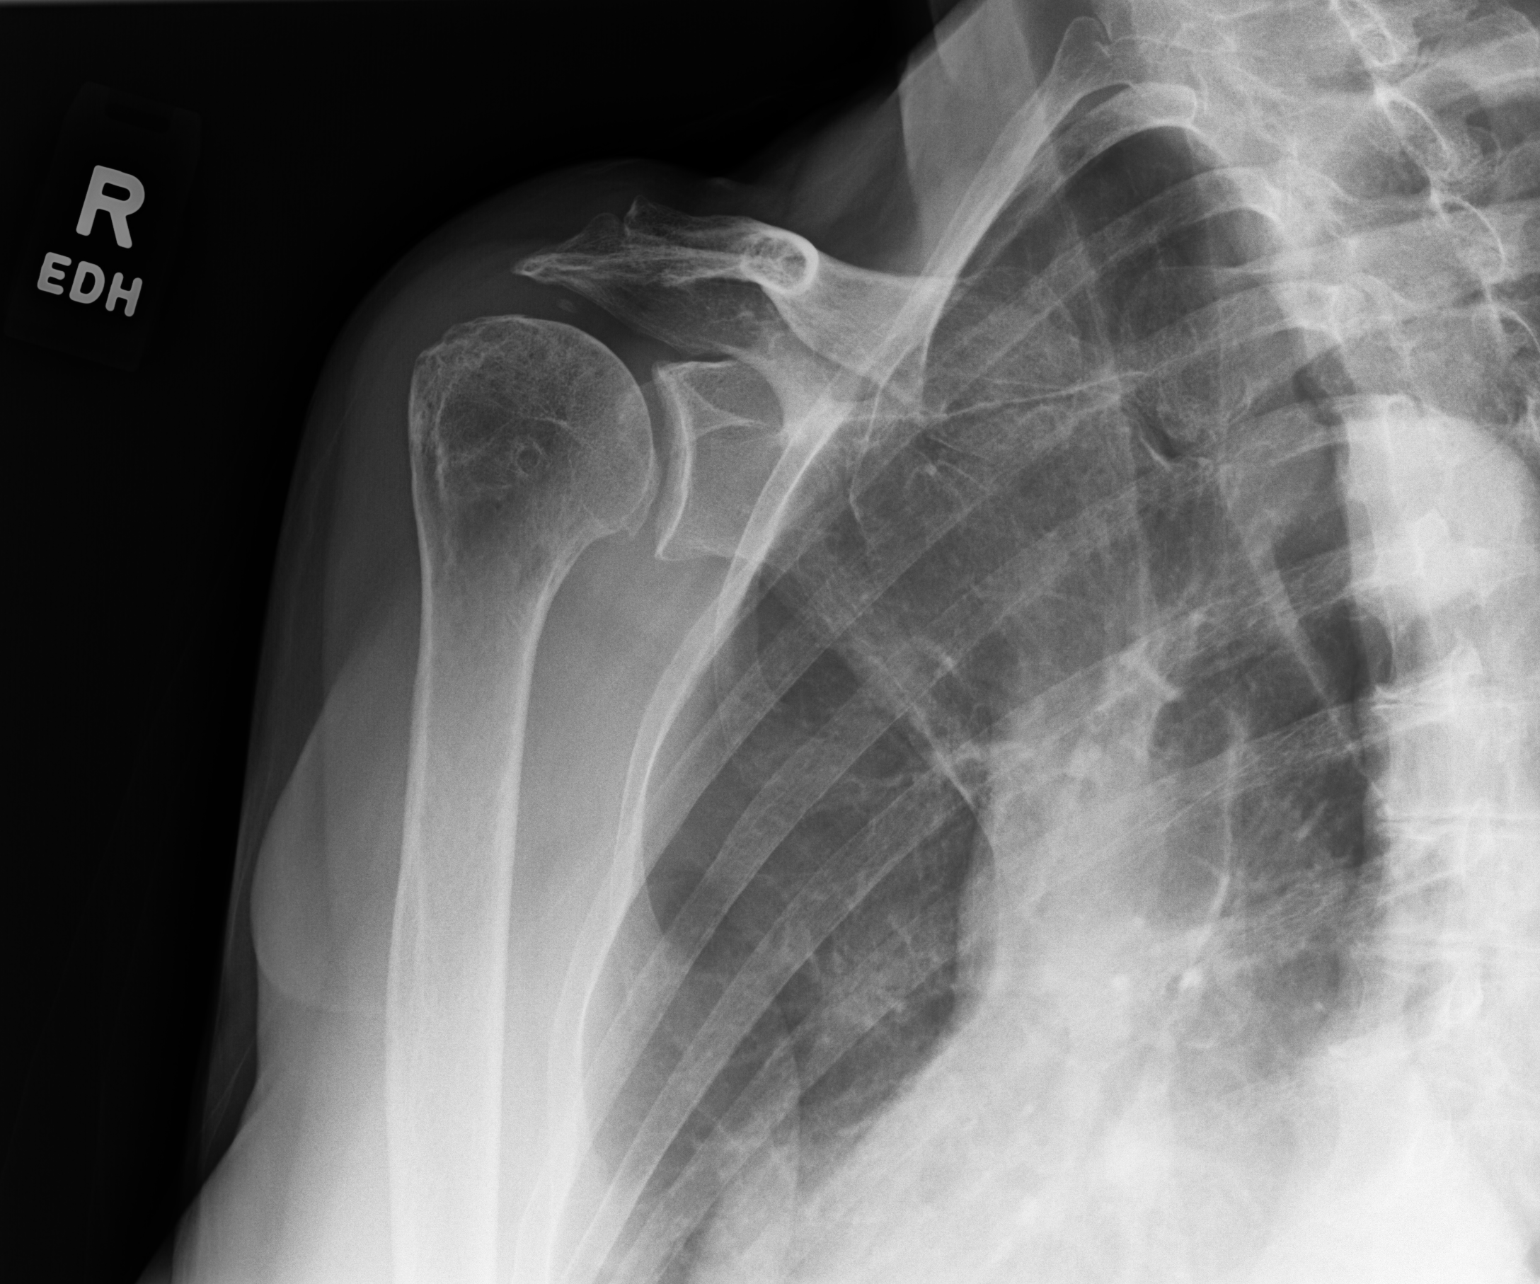

[dg shoulder right (2 of 3)]
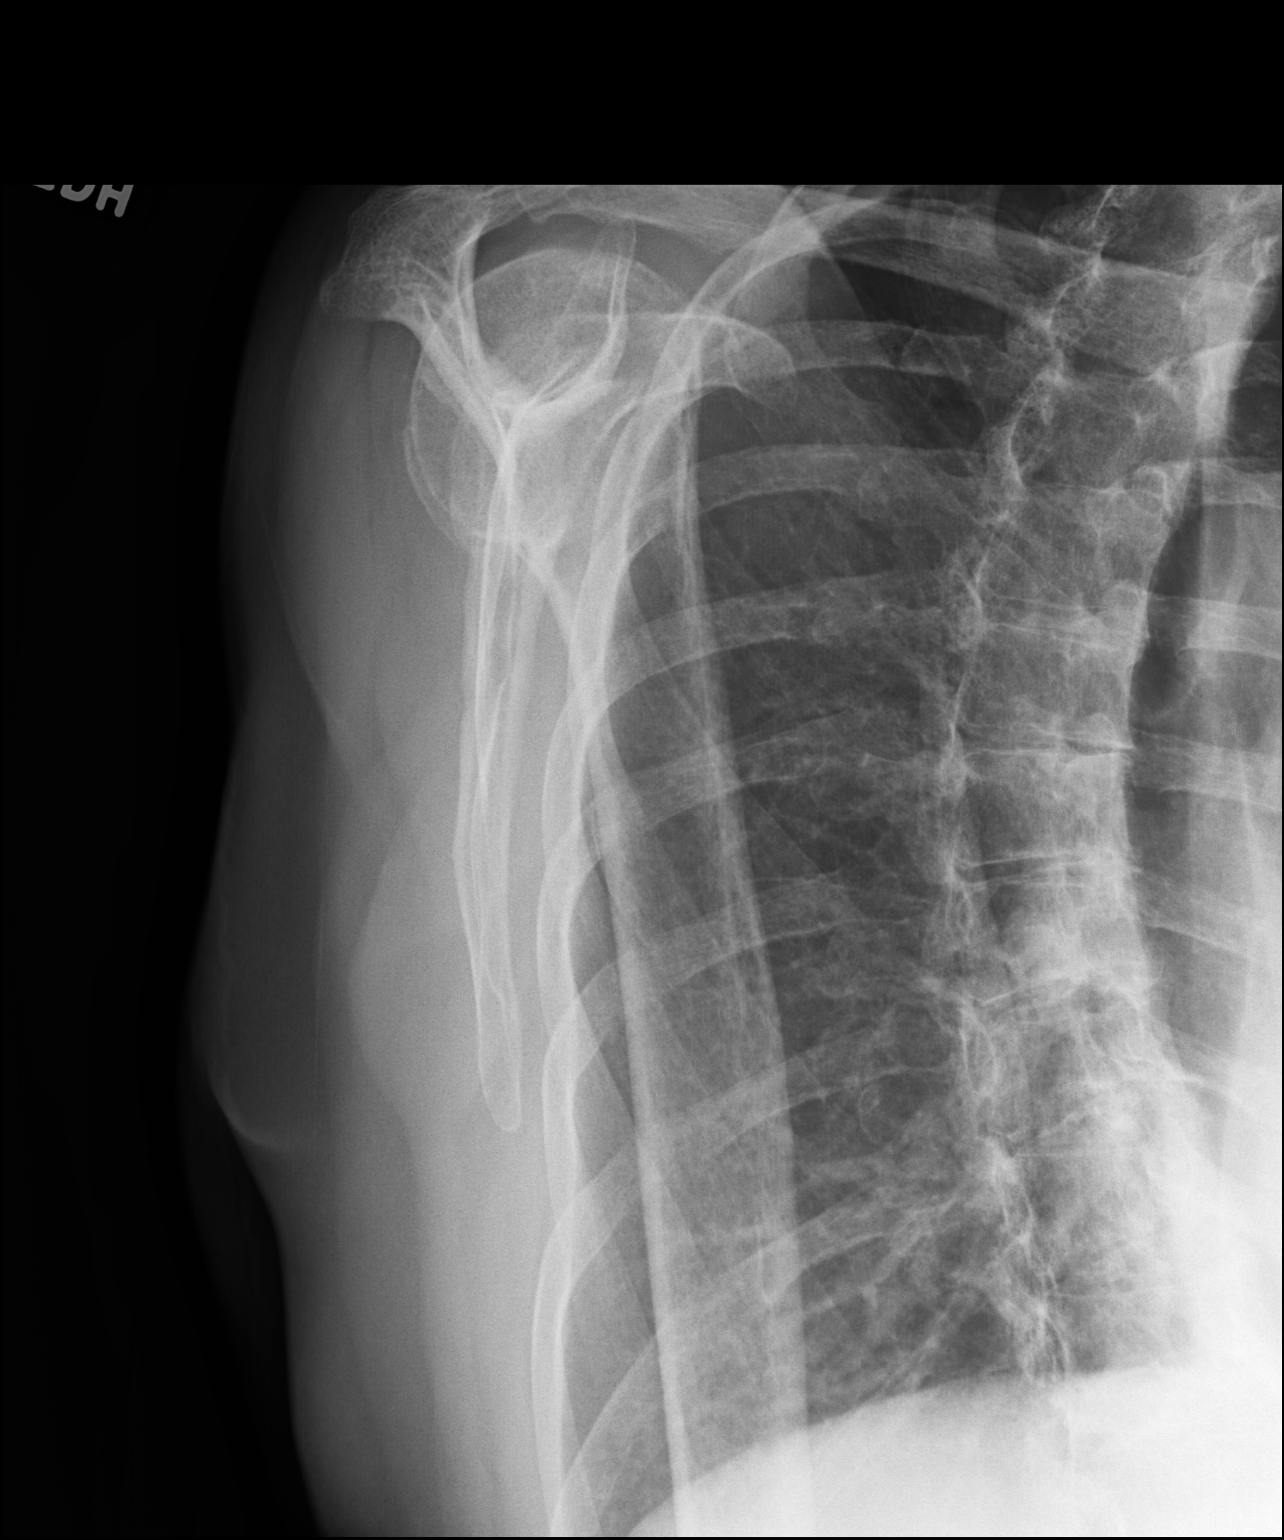

[dg shoulder right (3 of 3)]
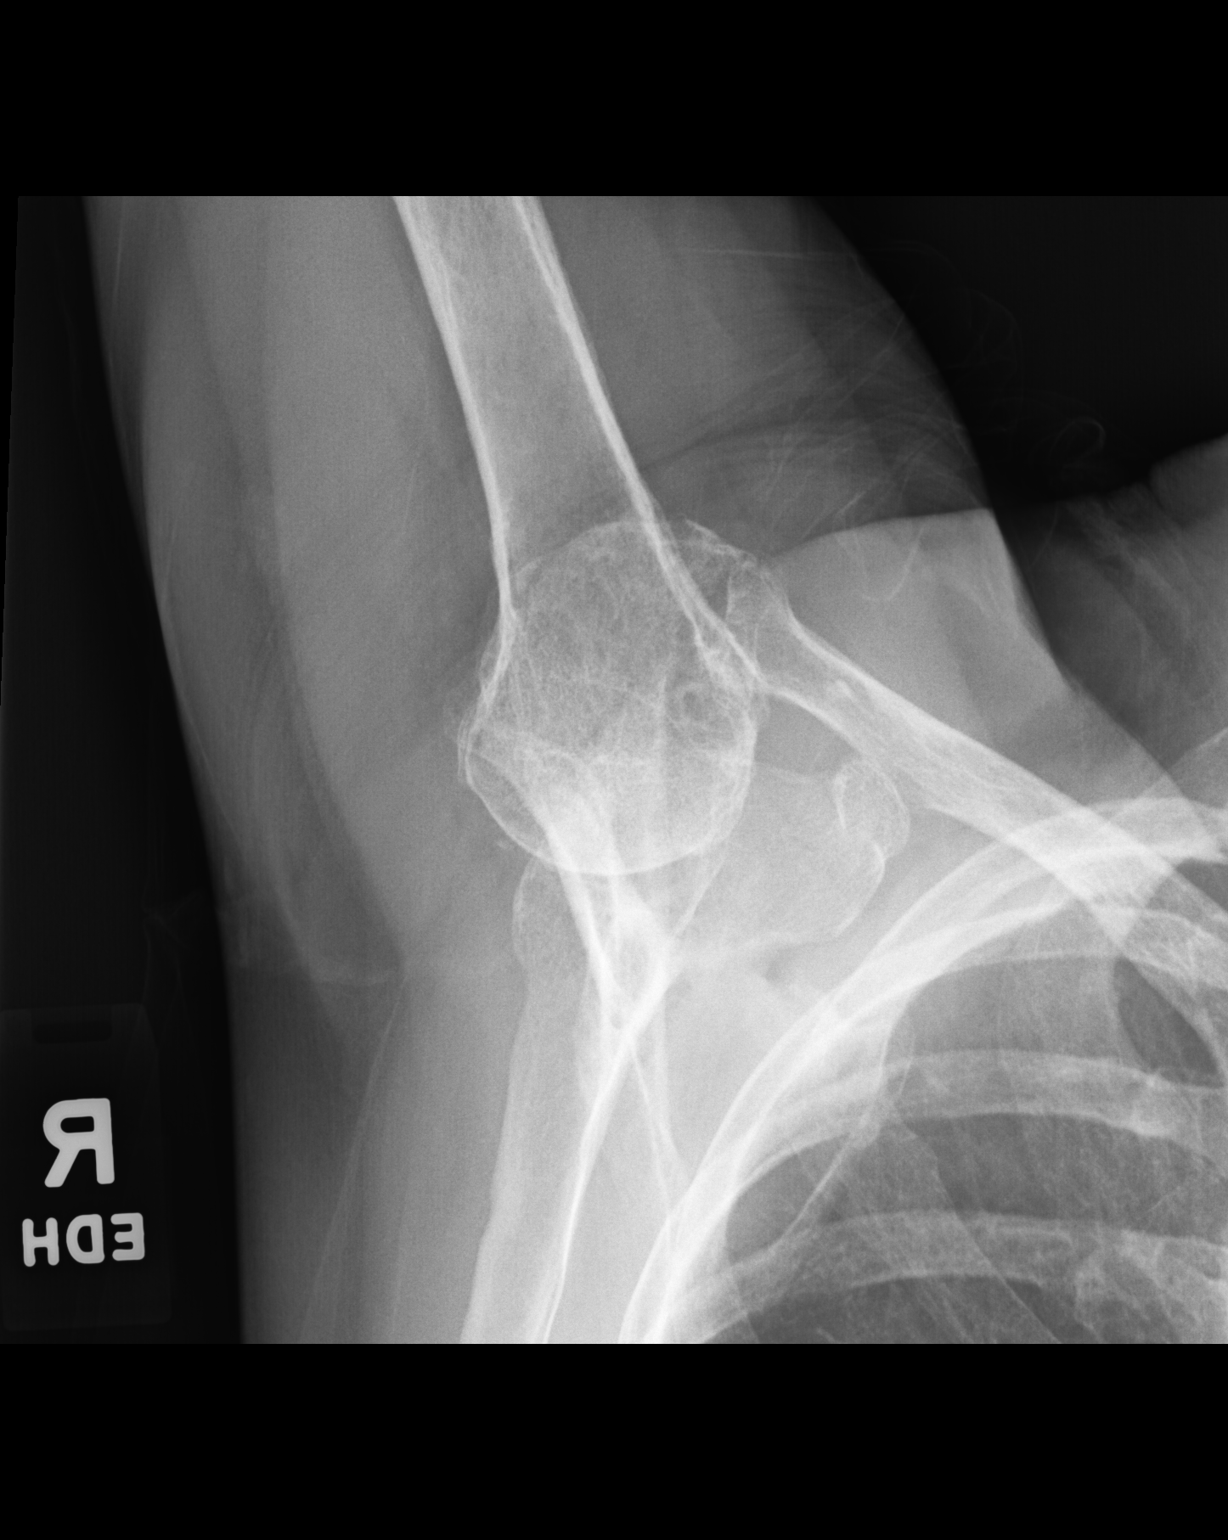

[3 of 3 positions shown; findings below may reference images not displayed]

FINDINGS: There is no evidence of fracture or dislocation. Mild to moderate
osteoarthritic changes of the right glenohumeral joint with joint
space narrowing and subchondral sclerosis. Soft tissues are
unremarkable.
IMPRESSION: Mild to moderate osteoarthritic changes of the right glenohumeral
joint.

## 2021-02-11 ENCOUNTER — Encounter (HOSPITAL_BASED_OUTPATIENT_CLINIC_OR_DEPARTMENT_OTHER): Payer: Self-pay | Admitting: Obstetrics & Gynecology

## 2021-02-11 ENCOUNTER — Ambulatory Visit: Payer: Medicare Other

## 2021-02-11 ENCOUNTER — Other Ambulatory Visit: Payer: Self-pay

## 2021-02-11 ENCOUNTER — Ambulatory Visit: Payer: Medicare Other | Admitting: Nurse Practitioner

## 2021-02-11 ENCOUNTER — Ambulatory Visit (INDEPENDENT_AMBULATORY_CARE_PROVIDER_SITE_OTHER): Payer: Medicare Other | Admitting: Obstetrics & Gynecology

## 2021-02-11 VITALS — BP 120/82 | HR 63 | Resp 18 | Ht 63.5 in | Wt 144.4 lb

## 2021-02-11 DIAGNOSIS — N816 Rectocele: Secondary | ICD-10-CM

## 2021-02-11 DIAGNOSIS — Z780 Asymptomatic menopausal state: Secondary | ICD-10-CM | POA: Diagnosis not present

## 2021-02-11 DIAGNOSIS — Z1211 Encounter for screening for malignant neoplasm of colon: Secondary | ICD-10-CM | POA: Diagnosis not present

## 2021-02-11 DIAGNOSIS — L900 Lichen sclerosus et atrophicus: Secondary | ICD-10-CM

## 2021-02-11 DIAGNOSIS — Z01419 Encounter for gynecological examination (general) (routine) without abnormal findings: Secondary | ICD-10-CM

## 2021-02-11 DIAGNOSIS — N952 Postmenopausal atrophic vaginitis: Secondary | ICD-10-CM

## 2021-02-11 DIAGNOSIS — E2839 Other primary ovarian failure: Secondary | ICD-10-CM | POA: Diagnosis not present

## 2021-02-11 DIAGNOSIS — Z8349 Family history of other endocrine, nutritional and metabolic diseases: Secondary | ICD-10-CM

## 2021-02-11 MED ORDER — ESTRADIOL 0.1 MG/GM VA CREA: TOPICAL_CREAM | VAGINAL | 3 refills | Status: DC

## 2021-02-11 NOTE — Progress Notes (Signed)
75 y.o. [id number] [name] female here for breast and pelvic exam.  Doing well.  Denies vaginal bleeding.  Planning on going to [place] this year.  Does need refill for estradiol cream.   Patient's last menstrual period was 10/28/1995.          Sexually active: No.  H/O STD:  no  Health Maintenance: PCP:  Dr. Regis Bill.    Vaccines are up to date:  No, Tdap is due. Colonoscopy:  cologuard 2019 MMG:  declines BMD:  Will order Last pap smear: 2019, normal H/o abnormal pap smear:  no   reports that she has never smoked. She has never used smokeless tobacco. She reports current alcohol use of about 2.0 - 3.0 standard drinks of alcohol per week. She reports that she does not use drugs.  Past Medical History:  Diagnosis Date  . Biceps tendinopathy 01/14/2012  . 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 Chronic pain of right knee 04/15/2013   Injected April 15, 2013  . Hearing aid worn   . Hepatitis A   . Hip pain, right 12/31/2011   A mild amount of arthritis is probable with the limitation of rotation but the key finding today was weakness in abduction   . 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  [name] Hx of varicella   . HX: benign breast biopsy   . Left rotator cuff tear 01/14/2012   Supraspinatus tear noted on ultrasound. Provided instructions regarding nitroglycerin patches 1/4 patch daily to shoulder.   To continue circular motion exercises and range of motion. No over the head exercises.  No weight bearing on that side. She can continue to play tennis as tolerated.  FU in 2 weeks if no improvement, 4 weeks if improved.      . Shoulder pain, left 12/31/2011   History of rotator cuff tear on the right and now has nontraumatic left shoulder pain   . Syncope    June, 2013  . Uterine prolapse     Past Surgical History:  Procedure Laterality Date  . APPENDECTOMY  1968  . BREAST BIOPSY  1998  . MOHS SURGERY     for squamous cell ca left nose  . OVARIAN CYST SURGERY   1968  . REPLACEMENT TOTAL KNEE Right   . ROTATOR CUFF REPAIR  2001  . TONSILLECTOMY AND ADENOIDECTOMY    . TUBAL LIGATION      Current Outpatient Medications  Medication Sig Dispense Refill  . estradiol (ESTRACE) 0.1 MG/GM vaginal cream 1 gram vaginally twice weekly 42.5 g 4  . meloxicam (MOBIC) 7.5 MG tablet Take 1-2 tablets (7.5-15 mg total) by mouth daily. As directed 60 tablet 1   No current facility-administered medications for this visit.    Family History  Problem Relation Age of Onset  . Stroke Father        age 41  . Other Mother        old age died 17   . Deep vein thrombosis Mother     Review of Systems  Constitutional: Negative.   Gastrointestinal: Negative.   Genitourinary: Negative.     Exam:   BP 120/82   Pulse 63   Resp 18   Ht 5' 3.5" (1.613 m)   Wt 144 lb 6.4 oz (65.5 kg)   LMP 10/28/1995   BMI 25.18 kg/m   Height: 5' 3.5" (161.3 cm)  General appearance: alert, cooperative and appears  stated age Breasts: normal appearance, no masses or tenderness Abdomen: soft, non-tender; bowel sounds normal; no masses,  no organomegaly Lymph nodes: Cervical, supraclavicular, and axillary nodes normal.  No abnormal inguinal nodes palpated Neurologic: Grossly normal  Pelvic: External genitalia:  no lesions              Urethra:  normal appearing urethra with no masses, tenderness or lesions              Bartholins and Skenes: normal                 Vagina: normal appearing vagina with atrophic changes and no discharge, no lesions              Cervix: no lesions              Pap taken: No. Bimanual Exam:  Uterus:  normal size, contour, position, consistency, mobility, non-tender              Adnexa: normal adnexa and no mass, fullness, tenderness               Rectovaginal: Confirms               Anus:  normal sphincter tone, no lesions  Chaperone, Shela Nevin, RN, was present for exam.  Assessment/Plan: 1. Encntr for gyn exam (general) (routine) w/o abn  findings - last pap smear 2019.  Guidelines reviewed.  Have stopped screening unless has new symptom/change on exam - has declined MMGs for 20 years so no planning on having now - cologuard ordered - BMD ordered - vaccines reviewed - blood work done with Dr. Regis Bill   2. Hypoestrogenism - DG BONE DENSITY (DXA); Future  3. Postmenopausal - no HRT  4. Lichen sclerosus - no abnormal skin changes on exam today  5. Rectocele - has used a pessary  6. Family history of hemochromatosis - ferritin was normal last year.  Reviewed with hematology.  Consider hemochromatosis DNA if sister is a carrier.  Discussed with pt repeating ferritin as well in the future.  7. Vaginal atrophy - RF for estrace vaginal cream 1 gram pv twice weekly.  #42.5 gram tube/3RF

## 2021-02-18 ENCOUNTER — Other Ambulatory Visit: Payer: Self-pay

## 2021-02-18 ENCOUNTER — Ambulatory Visit (HOSPITAL_BASED_OUTPATIENT_CLINIC_OR_DEPARTMENT_OTHER)
Admission: RE | Admit: 2021-02-18 | Discharge: 2021-02-18 | Disposition: A | Discharge status: Home / Self Care | Payer: Medicare Other | Source: Ambulatory Visit | Attending: Obstetrics & Gynecology | Admitting: Obstetrics & Gynecology

## 2021-02-18 DIAGNOSIS — M85851 Other specified disorders of bone density and structure, right thigh: Secondary | ICD-10-CM | POA: Diagnosis not present

## 2021-02-18 DIAGNOSIS — Z780 Asymptomatic menopausal state: Secondary | ICD-10-CM | POA: Diagnosis not present

## 2021-02-18 DIAGNOSIS — E2839 Other primary ovarian failure: Secondary | ICD-10-CM | POA: Insufficient documentation

## 2021-04-15 ENCOUNTER — Telehealth: Payer: Self-pay | Admitting: Internal Medicine

## 2021-04-15 MED ORDER — MELOXICAM 7.5 MG PO TABS: 7.5000 mg | ORAL_TABLET | Freq: Every day | ORAL | 1 refills | Status: DC

## 2021-04-15 NOTE — Telephone Encounter (Signed)
Pt is calling in stating that she needs a refill on Rx meloxicam (MOBIC) 7.5 MG Pharm:  [facility]  Pt would like to have a call once it has been called in to the pharmacy.

## 2021-04-15 NOTE — Telephone Encounter (Signed)
Rx sent and pt informed

## 2021-04-24 ENCOUNTER — Telehealth: Payer: Self-pay | Admitting: Internal Medicine

## 2021-04-24 NOTE — Telephone Encounter (Signed)
Left message for patient to call back and schedule Medicare Annual Wellness Visit (AWV) either virtually or in office.   Last AWV 05/26/16  please schedule at anytime with [facility] Nurse Health Advisor 1 or 2   This should be a 45 minute visit.

## 2021-04-25 DIAGNOSIS — D692 Other nonthrombocytopenic purpura: Secondary | ICD-10-CM | POA: Diagnosis not present

## 2021-04-25 DIAGNOSIS — L820 Inflamed seborrheic keratosis: Secondary | ICD-10-CM | POA: Diagnosis not present

## 2021-04-25 DIAGNOSIS — L720 Epidermal cyst: Secondary | ICD-10-CM | POA: Diagnosis not present

## 2021-04-25 DIAGNOSIS — L814 Other melanin hyperpigmentation: Secondary | ICD-10-CM | POA: Diagnosis not present

## 2021-04-25 DIAGNOSIS — D225 Melanocytic nevi of trunk: Secondary | ICD-10-CM | POA: Diagnosis not present

## 2021-04-25 DIAGNOSIS — L821 Other seborrheic keratosis: Secondary | ICD-10-CM | POA: Diagnosis not present

## 2021-05-01 ENCOUNTER — Encounter: Payer: Self-pay | Admitting: Internal Medicine

## 2021-05-06 ENCOUNTER — Other Ambulatory Visit: Payer: Self-pay

## 2021-05-06 ENCOUNTER — Encounter (HOSPITAL_BASED_OUTPATIENT_CLINIC_OR_DEPARTMENT_OTHER): Payer: Self-pay | Admitting: Obstetrics & Gynecology

## 2021-05-06 ENCOUNTER — Ambulatory Visit (HOSPITAL_BASED_OUTPATIENT_CLINIC_OR_DEPARTMENT_OTHER): Payer: Medicare Other | Admitting: Obstetrics & Gynecology

## 2021-05-06 ENCOUNTER — Other Ambulatory Visit (HOSPITAL_COMMUNITY)
Admission: RE | Admit: 2021-05-06 | Discharge: 2021-05-06 | Disposition: A | Discharge status: Home / Self Care | Payer: Medicare Other | Source: Ambulatory Visit | Attending: Obstetrics & Gynecology | Admitting: Obstetrics & Gynecology

## 2021-05-06 ENCOUNTER — Other Ambulatory Visit (HOSPITAL_BASED_OUTPATIENT_CLINIC_OR_DEPARTMENT_OTHER)
Admission: RE | Admit: 2021-05-06 | Discharge: 2021-05-06 | Disposition: A | Discharge status: Home / Self Care | Payer: Medicare Other | Source: Ambulatory Visit | Attending: Obstetrics & Gynecology | Admitting: Obstetrics & Gynecology

## 2021-05-06 ENCOUNTER — Other Ambulatory Visit (HOSPITAL_BASED_OUTPATIENT_CLINIC_OR_DEPARTMENT_OTHER): Payer: Medicare Other

## 2021-05-06 VITALS — BP 110/61 | HR 59 | Wt 143.0 lb

## 2021-05-06 DIAGNOSIS — R300 Dysuria: Secondary | ICD-10-CM | POA: Insufficient documentation

## 2021-05-06 DIAGNOSIS — N898 Other specified noninflammatory disorders of vagina: Secondary | ICD-10-CM | POA: Insufficient documentation

## 2021-05-06 MED ORDER — FLUCONAZOLE 150 MG PO TABS: 150.0000 mg | ORAL_TABLET | Freq: Once | ORAL | 0 refills | Status: AC

## 2021-05-06 NOTE — Patient Instructions (Signed)
Ok to use any over the counter external yeast cream.  You should be able to find a miconazole prescription.

## 2021-05-06 NOTE — Progress Notes (Signed)
GYNECOLOGY  VISIT  CC:   vaginal irritation  HPI: 75 y.o. [id number] [name] female here for complaint of vaginal irritation and discharge that has been present over the weekend.  She has started having a little dysuria today.  She denies vaginal bleeding.  She is leaving for [place] in two weeks.  GYNECOLOGIC HISTORY: Patient's last menstrual period was 10/28/1995. 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   Hx of varicella    HX: benign breast biopsy    Left rotator cuff tear 01/14/2012   Supraspinatus tear noted on ultrasound. Provided instructions regarding nitroglycerin patches 1/4 patch daily to shoulder.   To continue circular motion exercises and range of motion. No over the head exercises.  No weight bearing on that side. She can continue to play tennis as tolerated.  FU in 2 weeks if no improvement, 4 weeks if improved.       Shoulder pain, left 12/31/2011   History of rotator cuff tear on the right and now has nontraumatic left shoulder pain    Syncope    June, 2013   Uterine prolapse     Past Surgical History:  Procedure Laterality Date   [place]     for squamous cell ca left nose   OVARIAN CYST SURGERY  1968   REPLACEMENT TOTAL KNEE Right    ROTATOR CUFF REPAIR  2001   TONSILLECTOMY AND ADENOIDECTOMY     TUBAL LIGATION      MEDS:   Current Outpatient Medications on File Prior to Visit  Medication Sig Dispense Refill   estradiol (ESTRACE) 0.1 MG/GM vaginal cream 1 gram vaginally twice weekly 42.5 g 3   meloxicam (MOBIC) 7.5 MG tablet Take 1-2 tablets (7.5-15 mg total) by mouth daily. As directed 60 tablet 1   No current facility-administered medications on file prior to visit.    ALLERGIES: Patient has no known allergies.  Family History  Problem Relation Age of Onset   Stroke Father        age 26   Other Mother        old age died 110    Deep vein thrombosis Mother    SH:  married, non smoker  Review of Systems  Constitutional: Negative.   Genitourinary:  Positive for vaginal discharge.   PHYSICAL EXAMINATION:    BP 110/61   Pulse (!) 59   Wt 143 lb (64.9 kg)   LMP 10/28/1995   BMI 24.93  kg/m     General appearance: alert, cooperative and appears stated age Lymph:  no inguinal LAD noted  Pelvic: External genitalia:  no lesions but erythematous skin changes present              Urethra:  normal appearing urethra with no masses, tenderness or lesions              Bartholins and Skenes: normal                 Vagina: normal appearing vagina with normal color, yellowish discharge present              Cervix: no lesions              Bimanual Exam:  Uterus:  normal size, contour, position, consistency, mobility, non-tender              Adnexa: no mass, fullness, tenderness  Chaperone, Octaviano Batty, CMA, was present for exam.  Assessment/Plan: 1. Vaginal discharge - rx for diflucan 150mg  po x 1, repeat 72 hrs.  OTC miconazole for topical relief also recommended - Cervicovaginal ancillary only( [place])  2. Dysuria - Urine Culture

## 2021-05-06 NOTE — Addendum Note (Signed)
Addended by: Octaviano Batty B on: 05/06/2021 10:45 AM   Modules accepted: Orders

## 2021-05-08 ENCOUNTER — Encounter (HOSPITAL_BASED_OUTPATIENT_CLINIC_OR_DEPARTMENT_OTHER): Payer: Self-pay

## 2021-05-08 LAB — URINE CULTURE: Culture: <10000 — AB

## 2021-05-08 LAB — CERVICOVAGINAL ANCILLARY ONLY
Bacterial Vaginitis (gardnerella): NEGATIVE
Candida Glabrata: NEGATIVE
Candida Vaginitis: POSITIVE — AB
Comment: NEGATIVE
Comment: NEGATIVE
Comment: NEGATIVE

## 2021-05-10 ENCOUNTER — Encounter (HOSPITAL_BASED_OUTPATIENT_CLINIC_OR_DEPARTMENT_OTHER): Payer: Self-pay | Admitting: *Deleted

## 2021-05-10 NOTE — Progress Notes (Signed)
Received notification that patient is overdue for Cologuard testing. Pt was seen in April 2022 and test was ordered.

## 2021-05-14 ENCOUNTER — Other Ambulatory Visit: Payer: Self-pay

## 2021-05-15 ENCOUNTER — Ambulatory Visit (INDEPENDENT_AMBULATORY_CARE_PROVIDER_SITE_OTHER): Payer: Medicare Other | Admitting: Family Medicine

## 2021-05-15 ENCOUNTER — Encounter: Payer: Self-pay | Admitting: Family Medicine

## 2021-05-15 VITALS — BP 112/72 | HR 55 | Temp 98.2°F | Ht 63.5 in | Wt 144.4 lb

## 2021-05-15 DIAGNOSIS — H00015 Hordeolum externum left lower eyelid: Secondary | ICD-10-CM

## 2021-05-15 MED ORDER — ERYTHROMYCIN 5 MG/GM OP OINT: 1.0000 "application " | TOPICAL_OINTMENT | Freq: Three times a day (TID) | OPHTHALMIC | 0 refills | Status: DC

## 2021-05-15 NOTE — Progress Notes (Signed)
   Subjective:    Patient ID: Laura Curtis, female    DOB: 1946/02/06, 75 y.o.   MRN: [id number]  HPI Here for one week of a tender lump on the left lower eyelid. She has never had this before. She has applied warm compresses with no relief.    Review of Systems  Constitutional: Negative.   HENT: Negative.    Eyes:  Negative for pain, discharge, redness and visual disturbance.  Respiratory: Negative.    Cardiovascular: Negative.       Objective:   Physical Exam Constitutional:      Appearance: Normal appearance.  HENT:     Right Ear: Tympanic membrane, ear canal and external ear normal.     Left Ear: Tympanic membrane, ear canal and external ear normal.     Nose: Nose normal.     Mouth/Throat:     Pharynx: Oropharynx is clear.  Eyes:     Conjunctiva/sclera: Conjunctivae normal.     Pupils: Pupils are equal, round, and reactive to light.     Comments: There is a small stye along the lash line in the left lower lid   Cardiovascular:     Rate and Rhythm: Normal rate and regular rhythm.     Pulses: Normal pulses.     Heart sounds: Normal heart sounds.  Pulmonary:     Effort: Pulmonary effort is normal.     Breath sounds: Normal breath sounds.  Neurological:     Mental Status: She is alert.          Assessment & Plan:  Stye, treat with Erythromycin ointment TID.  Alysia Penna, MD

## 2021-05-28 ENCOUNTER — Telehealth: Payer: Self-pay | Admitting: Internal Medicine

## 2021-05-28 NOTE — Telephone Encounter (Signed)
Left message for patient to call back and schedule Medicare Annual Wellness Visit (AWV) either virtually or in office.   Last AWV 05/26/16  please schedule at anytime with [facility] Nurse Health Advisor 1 or 2   This should be a 45 minute visit.

## 2021-06-12 ENCOUNTER — Telehealth (INDEPENDENT_AMBULATORY_CARE_PROVIDER_SITE_OTHER): Payer: Medicare Other | Admitting: Internal Medicine

## 2021-06-12 ENCOUNTER — Telehealth: Payer: Self-pay

## 2021-06-12 ENCOUNTER — Encounter: Payer: Self-pay | Admitting: Internal Medicine

## 2021-06-12 VITALS — HR 95 | Ht 64.0 in | Wt 144.4 lb

## 2021-06-12 DIAGNOSIS — U071 COVID-19: Secondary | ICD-10-CM | POA: Diagnosis not present

## 2021-06-12 DIAGNOSIS — J988 Other specified respiratory disorders: Secondary | ICD-10-CM | POA: Diagnosis not present

## 2021-06-12 NOTE — Telephone Encounter (Signed)
Triage nurse called stating pt had Covid 10 days ago and is having difficulty with breathing the triage nurse advised pt to go to the ED but pt refused. I tried calling pt but no answer.

## 2021-06-12 NOTE — Telephone Encounter (Signed)
I spoke with the pt and she reported shortness of breath, fatigue and sore throat x2 weeks, Pt stated that she tested positive for Covid 10 days ago. Pt reported that she tested her O2 levels this morning and it was at 95%. Iadvised the pt to visit Urgent care if symptoms worsen or she develop any chest pressure, tightness, or syncope. Please advise.

## 2021-06-12 NOTE — Telephone Encounter (Signed)
Video visit scheduled for 08/17 with PCP.

## 2021-06-12 NOTE — Progress Notes (Signed)
Virtual Visit via Video Note  I connected withNAME@ on 06/12/21 at  3:30 PM EDT by a video enabled telemedicine application and verified that I am speaking with the correct person using two identifiers. Location patient: home Location provider:work office Persons participating in the virtual visit: patient, provider  WIth national recommendations  regarding COVID 19 pandemic   video visit is advised over in office visit for this patient.  Patient aware  of the limitations of evaluation and management by telemedicine and  availability of in person appointments. and agreed to proceed.   HPI: Laura Curtis presents for video visit because of ongoing symptoms with a positive COVID test.  She was in [place] had some minor symptoms Sunday the 14th came back positive COVID test on the 16th sore throat that was quite severe some laryngitis then a minor upper cough no current fever but feeling like she is short of breath described as upper airway without stridor that cannot cough a pulse ox was done neighbors 95%.  She has had her vaccinations. No vomiting or diarrhea that would cause dehydration. Husband tested positive days later but is not as sick as she is.   ROS: See pertinent positives and negatives per HPI.  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   Hx of varicella    HX: benign breast biopsy    Left rotator cuff tear 01/14/2012   Supraspinatus tear noted on ultrasound. Provided instructions regarding nitroglycerin patches 1/4 patch daily to shoulder.   To continue circular motion exercises and range of motion. No  over the head exercises.  No weight bearing on that side. She can continue to play tennis as tolerated.  FU in 2 weeks if no improvement, 4 weeks if improved.       Shoulder pain, left 12/31/2011   History of rotator cuff tear on the right and now has nontraumatic left shoulder pain    Syncope    June, 2013   Uterine prolapse     Past Surgical History:  Procedure Laterality Date   [place]     for squamous cell ca left nose   OVARIAN CYST SURGERY  1968   REPLACEMENT TOTAL KNEE Right    ROTATOR CUFF REPAIR  2001   TONSILLECTOMY AND ADENOIDECTOMY     TUBAL LIGATION      Family History  Problem Relation Age of Onset   Stroke Father        age 47   Other Mother        old age died 31    Deep vein thrombosis Mother     Social History   Tobacco Use   Smoking status: Never   Smokeless tobacco: Never  Vaping Use   Vaping Use: Never used  Substance Use Topics   Alcohol use: Yes    Alcohol/week: 2.0 - 3.0 standard drinks    Types: 2 - 3 Standard drinks or equivalent per week  Comment: occ   Drug use: No      Current Outpatient Medications:    estradiol (ESTRACE) 0.1 MG/GM vaginal cream, 1 gram vaginally twice weekly, Disp: 42.5 g, Rfl: 3   meloxicam (MOBIC) 7.5 MG tablet, Take 1-2 tablets (7.5-15 mg total) by mouth daily. As directed, Disp: 60 tablet, Rfl: 1   erythromycin ophthalmic ointment, Place 1 application into the left eye 3 (three) times daily., Disp: 3.5 g, Rfl: 0  EXAM: BP Readings from Last 3 Encounters:  05/15/21 112/72  05/06/21 110/61  02/11/21 120/82    VITALS per patient if applicable:  GENERAL: alert, oriented, appears well and in no acute distress nontoxic looks mildly ill no significant coughing or stridor during the exam mildly hoarse  HEENT: atraumatic, conjunttiva clear, no obvious abnormalities on inspection of external nose and ears  NECK: normal movements of the head and neck  LUNGS: on  inspection no signs of respiratory distress, breathing rate appears normal, no obvious gross SOB, gasping or wheezing  CV: no obvious cyanosis  MS: moves all visible extremities without noticeable abnormality  PSYCH/NEURO: pleasant and cooperative, no obvious depression or anxiety, speech and thought processing grossly intact Lab Results  Component Value Date   WBC 7.3 12/24/2020   HGB 14.0 12/24/2020   HCT 40.0 12/24/2020   PLT 250.0 12/24/2020   GLUCOSE 94 12/24/2020   CHOL 222 (H) 07/14/2018   TRIG 88.0 07/14/2018   HDL 64.80 07/14/2018   LDLDIRECT 138.7 07/27/2012   LDLCALC 139 (H) 07/14/2018   ALT 18 12/24/2020   AST 15 12/24/2020   NA 140 12/24/2020   K 4.1 12/24/2020   CL 107 12/24/2020   CREATININE 0.81 12/24/2020   BUN 20 12/24/2020   CO2 23 12/24/2020   TSH 4.18 08/31/2018    ASSESSMENT AND PLAN:  Discussed the following assessment and plan:    ICD-10-CM   1. Respiratory tract infection due to COVID-19 virus  U07.1    J98.8      Resp sx the feeling of dyspnea sound like upper large airway and not air space disease  or wheezing   has had nl pulse ox  sx rx warms moist inhalation   mucinex ok . Rest fluids and observation out of the window for  antivirals but  not sure would have helped .  Is observation for decompensation and emergent evaluation for alarm symptoms. She can let us know if cough medicine for suppression comfort is needed. Counseled.   Expectant management and discussion of plan and treatment with opportunity to ask questions and all were answered. The patient agreed with the plan and demonstrated an understanding of the instructions.   Advised to call back or seek an in-person evaluation if worsening  or having  further concerns . Return if symptoms worsen or fail to improve as expected.    Shanon Ace, MD

## 2021-06-12 NOTE — Telephone Encounter (Signed)
Ok to make virtual visit ( I see that there is a visit with Dr Maudie Mercury tomorrow  but I can do a virtual  also  330 today opened up.

## 2021-06-13 ENCOUNTER — Telehealth: Payer: Medicare Other | Admitting: Family Medicine

## 2021-06-18 DIAGNOSIS — H00016 Hordeolum externum left eye, unspecified eyelid: Secondary | ICD-10-CM | POA: Diagnosis not present

## 2021-06-18 DIAGNOSIS — H5212 Myopia, left eye: Secondary | ICD-10-CM | POA: Diagnosis not present

## 2021-06-24 ENCOUNTER — Telehealth (HOSPITAL_BASED_OUTPATIENT_CLINIC_OR_DEPARTMENT_OTHER): Payer: Self-pay | Admitting: Obstetrics & Gynecology

## 2021-06-24 ENCOUNTER — Ambulatory Visit (HOSPITAL_BASED_OUTPATIENT_CLINIC_OR_DEPARTMENT_OTHER): Payer: Medicare Other | Admitting: Obstetrics & Gynecology

## 2021-06-24 ENCOUNTER — Ambulatory Visit: Payer: Medicare Other | Admitting: Podiatry

## 2021-06-24 ENCOUNTER — Encounter (HOSPITAL_BASED_OUTPATIENT_CLINIC_OR_DEPARTMENT_OTHER): Payer: Self-pay | Admitting: Obstetrics & Gynecology

## 2021-06-24 ENCOUNTER — Other Ambulatory Visit (HOSPITAL_COMMUNITY)
Admission: RE | Admit: 2021-06-24 | Discharge: 2021-06-24 | Disposition: A | Discharge status: Home / Self Care | Payer: Medicare Other | Source: Ambulatory Visit | Attending: Obstetrics & Gynecology | Admitting: Obstetrics & Gynecology

## 2021-06-24 ENCOUNTER — Encounter (HOSPITAL_BASED_OUTPATIENT_CLINIC_OR_DEPARTMENT_OTHER): Payer: Self-pay

## 2021-06-24 ENCOUNTER — Other Ambulatory Visit: Payer: Self-pay

## 2021-06-24 VITALS — BP 124/61 | HR 73 | Ht 64.0 in | Wt 146.0 lb

## 2021-06-24 DIAGNOSIS — L292 Pruritus vulvae: Secondary | ICD-10-CM

## 2021-06-24 DIAGNOSIS — L600 Ingrowing nail: Secondary | ICD-10-CM

## 2021-06-24 DIAGNOSIS — N898 Other specified noninflammatory disorders of vagina: Secondary | ICD-10-CM | POA: Insufficient documentation

## 2021-06-24 MED ORDER — TRIAMCINOLONE ACETONIDE 0.5 % EX OINT: 1.0000 "application " | TOPICAL_OINTMENT | Freq: Two times a day (BID) | CUTANEOUS | 0 refills | Status: DC

## 2021-06-24 NOTE — Telephone Encounter (Signed)
Stop the cortisone  babay shmpoos ok  Can she come in at 4:00 tomorrow?

## 2021-06-24 NOTE — Telephone Encounter (Signed)
Called patient and left message to please call the office to set up the appointment .

## 2021-06-24 NOTE — Progress Notes (Signed)
GYNECOLOGY  VISIT  CC:   vaginal discharge, labial irritation  HPI: 75 y.o. Laura Curtis [name] female here for complaint of of vaginal discharge and a lot of labia irritation.  She denies urinary symptoms or vaginal bleeding.  Pt has rx for vaginal estrogen cream.  She decided to restart this but then stopped knowing she was coming for an appointment.  Itching is all external.  Denis urinary symptoms.   Did get to [place] this year but got Covid during the traveling and was int the bed for a week.  This was about 1/3 of the trip so this was disappointing.  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   Hx of varicella    HX: benign breast biopsy    Left rotator cuff tear 01/14/2012   Supraspinatus tear noted on ultrasound. Provided instructions regarding nitroglycerin patches 1/4 patch daily to shoulder.   To continue circular motion exercises and range of motion. No over the head exercises.  No weight bearing on that side. She can continue to play tennis as tolerated.  FU in 2 weeks if no improvement, 4 weeks if improved.       Shoulder pain, left 12/31/2011   History of  rotator cuff tear on the right and now has nontraumatic left shoulder pain    Syncope    June, 2013   Uterine prolapse     Past Surgical History:  Procedure Laterality Date   [place]     for squamous cell ca left nose   OVARIAN CYST SURGERY  1968   REPLACEMENT TOTAL KNEE Right    ROTATOR CUFF REPAIR  2001   TONSILLECTOMY AND ADENOIDECTOMY     TUBAL LIGATION      MEDS:   Current Outpatient Medications on File Prior to Visit  Medication Sig Dispense Refill   estradiol (ESTRACE) 0.1 MG/GM vaginal cream 1 gram vaginally twice weekly 42.5 g 3   meloxicam (MOBIC) 7.5 MG tablet Take 1-2 tablets (7.5-15 mg total) by mouth daily. As directed 60 tablet 1   No current facility-administered medications on file prior to visit.    ALLERGIES: Patient has no known allergies.  Family History  Problem Relation Age of Onset   Stroke Father        age 47   Other Mother        old age died 24    Deep vein thrombosis Mother  SH:  married, non smoker  Review of Systems  Constitutional: Negative.   Genitourinary:  Positive for vaginal discharge.       Vulvar itching   PHYSICAL EXAMINATION:    BP 124/61   Pulse 73   Ht '5\' 4"'$  (1.626 m)   Wt 146 lb (66.2 kg)   LMP 10/28/1995   BMI 25.06 kg/m     General appearance: alert, cooperative and appears stated age Lymph:  no inguinal LAD noted  Pelvic: External genitalia:  no lesions but external labia majora is erythematous on the right side (consistent with location of itching)              Urethra:  normal appearing urethra with no masses, tenderness or lesions              Bartholins and Skenes: normal                 Vagina: normal appearing vagina with atrophic vaginal tissue, whitish discharge present discharge, no lesions              Cervix: no lesions              Anus:  no lesions  Chaperone, Laura [place], CMA, was present for exam.  Assessment/Plan: 1. Vaginal  discharge - Cervicovaginal ancillary only( [place]) - will treated based on results - if negative, will recommend continued vaginal estrogen cream   2. Vulvar itching - triamcinolone ointment (KENALOG) 0.5 %; Apply 1 application topically 2 (two) times daily.  Dispense: 30 g; Refill: 0

## 2021-06-25 ENCOUNTER — Encounter: Payer: Self-pay | Admitting: Internal Medicine

## 2021-06-25 ENCOUNTER — Ambulatory Visit (INDEPENDENT_AMBULATORY_CARE_PROVIDER_SITE_OTHER): Payer: Medicare Other | Admitting: Internal Medicine

## 2021-06-25 VITALS — BP 114/62 | HR 67 | Temp 98.0°F | Ht 64.0 in | Wt 144.2 lb

## 2021-06-25 DIAGNOSIS — H5789 Other specified disorders of eye and adnexa: Secondary | ICD-10-CM | POA: Diagnosis not present

## 2021-06-25 DIAGNOSIS — U071 COVID-19: Secondary | ICD-10-CM

## 2021-06-25 DIAGNOSIS — J988 Other specified respiratory disorders: Secondary | ICD-10-CM | POA: Diagnosis not present

## 2021-06-25 LAB — CERVICOVAGINAL ANCILLARY ONLY
Bacterial Vaginitis (gardnerella): NEGATIVE
Candida Glabrata: NEGATIVE
Candida Vaginitis: NEGATIVE
Comment: NEGATIVE
Comment: NEGATIVE
Comment: NEGATIVE

## 2021-06-25 MED ORDER — ERYTHROMYCIN 5 MG/GM OP OINT: 1.0000 "application " | TOPICAL_OINTMENT | Freq: Two times a day (BID) | OPHTHALMIC | 1 refills | Status: DC

## 2021-06-25 NOTE — Patient Instructions (Addendum)
Would have you use warm compresses and then place the  eye ointment in  and use 7-10 days .  Continue  baby shampoo cleansing  also  If ongoing after that  then see eye doc  .  At this time I dont think  oral antibiotic is indicated .

## 2021-06-25 NOTE — Progress Notes (Signed)
Chief Complaint  Patient presents with   Eye Problem    Patient complains of eye irritation, x1 month, Patient tried Baby shampoo, Erethromycin with little relief    HPI: Laura Curtis 75 y.o. come in for problem with lateral right eye irritated and dc in am and sometimes itchy .   Tried left over erythromycin for 3 days no help . Hx of syles not today  Using baby  shampoo Actually saw her optometrist Dr Sabra Heck recently and not addressed but rx for stye issues.   No vision change fever   Had covid 8 14  had visit on 17  much better still tired but will play pickel ball and walk some  rest .  Doing much better  ROS: See pertinent positives and negatives per HPI.  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   Hx of varicella    HX: benign breast biopsy    Left rotator cuff tear 01/14/2012   Supraspinatus tear noted on ultrasound. Provided instructions regarding nitroglycerin patches 1/4 patch daily to shoulder.   To continue circular motion exercises and range of motion. No over the head exercises.  No weight bearing on that side. She can continue to play tennis as tolerated.  FU in 2 weeks if no improvement, 4 weeks if improved.       Shoulder pain, left 12/31/2011   History of rotator cuff tear on the right and now has nontraumatic left shoulder pain    Syncope    June, 2013   Uterine prolapse     Family History  Problem Relation Age of Onset   Stroke Father        age 77   Other Mother        old age died 77    Deep vein thrombosis Mother     Social History   Socioeconomic History    Marital status: Married    Spouse name: Not on file   Number of children: Not on file   Years of education: Not on file   Highest education level: Not on file  Occupational History   Not on file  Tobacco Use   Smoking status: Never   Smokeless tobacco: Never  Vaping Use   Vaping Use: Never used  Substance and Sexual Activity   Alcohol use: Yes    Alcohol/week: 2.0 - 3.0 standard drinks    Types: 2 - 3 Standard drinks or equivalent per week    Comment: occ   Drug use: No   Sexual activity: Not Currently    Birth control/protection: None, Surgical    Comment: BTL  Other Topics Concern   Not on file  Social History Narrative   hh of 2 married      retired radiation oncology asrt.   [place] 26 [place]years    From [place]   G4G3   Active  Heavy gardening exercise tennis    Wears seat belts , no firearms , ets, tanning  beds . Sees dentist on a regular basis.    etoh 3 x per week                Social Determinants of Health   Financial Resource Strain: Not on file  Food Insecurity: Not on file  Transportation Needs: Not on file  Physical Activity: Not on file  Stress: Not on file  Social Connections: Not on file    Outpatient Medications Prior to Visit  Medication Sig Dispense Refill   estradiol (ESTRACE) 0.1 MG/GM vaginal cream 1 gram vaginally twice weekly 42.5 g 3   meloxicam (MOBIC) 7.5 MG tablet Take 1-2 tablets (7.5-15 mg total) by mouth daily. As directed 60 tablet 1   triamcinolone ointment (KENALOG) 0.5 % Apply 1 application topically 2 (two) times daily. 30 g 0   No facility-administered medications prior to visit.     EXAM:  BP 114/62 (BP Location: Left Arm, Patient Position: Sitting, Cuff Size: Normal)   Pulse 67   Temp 98 F (36.7 C) (Oral)   Ht '5\' 4"'$  (1.626 m)   Wt 144 lb 3.2 oz (65.4 kg)   LMP 10/28/1995   SpO2 96%   BMI 24.75 kg/m   Body mass index is 24.75 kg/m.  GENERAL: vitals reviewed and listed above, alert, oriented, appears well  hydrated and in no acute distress HEENT: atraumatic, conjunctiva lateral right min redness and irritration no dc seen and lid no edema ( but hx of same)   perrl eoms nl  no edema  or mass no obvious abnormalities on inspection of external nose and ears OP : masked  NECK: no obvious masses on inspection palpation  LUNGS: clear to auscultation bilaterally, no wheezes, rales or rhonchi, good air movement CV: HRRR, no clubbing cyanosis or  peripheral edema nl cap refill  MS: moves all extremities without noticeable focal  abnormality PSYCH: pleasant and cooperative, no obvious depression or anxiety Lab Results  Component Value Date   WBC 7.3 12/24/2020   HGB 14.0 12/24/2020   HCT 40.0 12/24/2020   PLT 250.0 12/24/2020   GLUCOSE 94 12/24/2020   CHOL 222 (H) 07/14/2018   TRIG 88.0 07/14/2018   HDL 64.80 07/14/2018   LDLDIRECT 138.7 07/27/2012   LDLCALC 139 (H) 07/14/2018   ALT 18 12/24/2020   AST 15 12/24/2020   NA 140 12/24/2020   K 4.1 12/24/2020   CL 107 12/24/2020   CREATININE 0.81 12/24/2020   BUN 20 12/24/2020   CO2 23 12/24/2020   TSH 4.18 08/31/2018   BP Readings from Last 3 Encounters:  06/25/21 114/62  06/24/21 124/61  05/15/21 112/72    ASSESSMENT AND PLAN:  Discussed the following assessment and plan:  Irritation of right eye - hx of styles   could be lac gland inflammation or  early blepharaitis?  . no alarm sx 7- 10 days of ery oint bid and if no better see eye team   Respiratory tract infection due to COVID-19 virus - 2 weeks out much better   -Patient advised to return or notify health care team  if  new concerns arise.  Patient Instructions  Would have you use warm compresses and then place the  eye ointment in  and use 7-10 days .  Continue  baby shampoo cleansing  also  If ongoing after that  then see eye doc  .  At this time I dont think  oral antibiotic is indicated .    Standley Brooking. Saquoia Sianez M.D.

## 2021-06-28 ENCOUNTER — Ambulatory Visit: Payer: Medicare Other

## 2021-06-28 ENCOUNTER — Encounter: Payer: Medicare Other | Admitting: Internal Medicine

## 2021-07-03 ENCOUNTER — Other Ambulatory Visit: Payer: Self-pay

## 2021-07-03 ENCOUNTER — Ambulatory Visit (INDEPENDENT_AMBULATORY_CARE_PROVIDER_SITE_OTHER): Payer: Medicare Other | Admitting: Internal Medicine

## 2021-07-03 ENCOUNTER — Encounter: Payer: Self-pay | Admitting: Internal Medicine

## 2021-07-03 VITALS — BP 104/60 | HR 73 | Temp 98.4°F | Ht 64.5 in | Wt 143.4 lb

## 2021-07-03 DIAGNOSIS — E7800 Pure hypercholesterolemia, unspecified: Secondary | ICD-10-CM

## 2021-07-03 DIAGNOSIS — Z8616 Personal history of COVID-19: Secondary | ICD-10-CM

## 2021-07-03 DIAGNOSIS — Z0000 Encounter for general adult medical examination without abnormal findings: Secondary | ICD-10-CM | POA: Diagnosis not present

## 2021-07-03 DIAGNOSIS — M19049 Primary osteoarthritis, unspecified hand: Secondary | ICD-10-CM

## 2021-07-03 DIAGNOSIS — Z79899 Other long term (current) drug therapy: Secondary | ICD-10-CM

## 2021-07-03 DIAGNOSIS — Z8349 Family history of other endocrine, nutritional and metabolic diseases: Secondary | ICD-10-CM

## 2021-07-03 NOTE — Progress Notes (Signed)
Chief Complaint  Patient presents with   Annual Exam    HPI: Patient  Laura Curtis  75 y.o. comes in today for Preventive Health Care visit   Sister  hemachormatosis    ? But is a drinker.   Patient had a normal ferritin level a few years ago. Eye irritation got better with 1 dose of a cream given by Dr. Sabra Heck for vaginal problem.  With think it was [facility] no vision change doing well She is fully recovered from the COVID-19 infection feels well. [name] Up-to-date on immunizations except tetanus.  2010.  We will get a flu vaccine in October or thereabouts.  Not high risk for colon cancer last Cologuard 2019 negative  Health Maintenance  Topic Date Due   TETANUS/TDAP  01/13/2019   Zoster Vaccines- Shingrix (2 of 2) 05/21/2021   INFLUENZA VACCINE  05/27/2021   COVID-19 Vaccine (4 - Booster for Pfizer series) 11/15/2021 (Originally 11/27/2020)   DEXA SCAN  Completed   Hepatitis C Screening  Completed   PNA vac Low Risk Adult  Completed   HPV VACCINES  Aged Out   Negative TD exercise regularly normal sleep.   ROS:  REST of 12 system review negative except as per HPI no unusual skin problems GI GU that has not been addressed.   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   Hx of varicella    HX: benign breast biopsy    Left rotator cuff tear 01/14/2012   Supraspinatus tear noted on ultrasound. Provided instructions regarding nitroglycerin patches 1/4 patch daily to shoulder.   To continue circular motion exercises and range of motion. No over the head exercises.  No weight bearing on that side.  She can continue to play tennis as tolerated.  FU in 2 weeks if no improvement, 4 weeks if improved.       Shoulder pain, left 12/31/2011   History of rotator cuff tear on the right and now has nontraumatic left shoulder pain    Syncope    June, 2013   Uterine prolapse     Past Surgical History:  Procedure Laterality Date   [place]     for squamous cell ca left nose   OVARIAN CYST SURGERY  1968   REPLACEMENT TOTAL KNEE Right    ROTATOR CUFF REPAIR  2001   TONSILLECTOMY AND ADENOIDECTOMY     TUBAL LIGATION      Family History  Problem Relation Age of Onset   Stroke Father        age 71   Other Mother        old age died 32    Deep vein thrombosis Mother     Social History   Socioeconomic History   Marital status: Married    Spouse name: Not on file   Number of children: Not on file   Years of education: Not on file  Highest education level: Not on file  Occupational History   Not on file  Tobacco Use   Smoking status: Never   Smokeless tobacco: Never  Vaping Use   Vaping Use: Never used  Substance and Sexual Activity   Alcohol use: Yes    Alcohol/week: 2.0 - 3.0 standard drinks    Types: 2 - 3 Standard drinks or equivalent per week    Comment: occ   Drug use: No   Sexual activity: Not Currently    Birth control/protection: None, Surgical    Comment: BTL  Other Topics Concern   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 Sees dentist on a regular basis.    etoh 3 x per week                Social Determinants of Health   Financial Resource Strain: Not on file  Food Insecurity: Not on file  Transportation Needs: Not on file  Physical Activity: Not on file  Stress: Not on file  Social Connections: Not on file     Outpatient Medications Prior to Visit  Medication Sig Dispense Refill   estradiol (ESTRACE) 0.1 MG/GM vaginal cream 1 gram vaginally twice weekly 42.5 g 3   meloxicam (MOBIC) 7.5 MG tablet Take 1-2 tablets (7.5-15 mg total) by mouth daily. As directed 60 tablet 1   triamcinolone ointment (KENALOG) 0.5 % Apply 1 application topically 2 (two) times daily. 30 g 0   erythromycin ophthalmic ointment Place 1 application into the right eye 2 (two) times daily. After warm compresses. 3.5 g 1   No facility-administered medications prior to visit.     EXAM:  BP 104/60 (BP Location: Left Arm, Patient Position: Sitting, Cuff Size: Normal)   Pulse 73   Temp 98.4 F (36.9 C) (Oral)   Ht 5' 4.5" (1.638 m)   Wt 143 lb 6.4 oz (65 kg)   LMP 10/28/1995   SpO2 94%   BMI 24.23 kg/m   Body mass index is 24.23 kg/m. Wt Readings from Last 3 Encounters:  07/03/21 143 lb 6.4 oz (65 kg)  06/25/21 144 lb 3.2 oz (65.4 kg)  06/24/21 146 lb (66.2 kg)    Physical Exam: Vital signs reviewed [id number] is a well-developed well-nourished alert cooperative    who appearsr stated age in no acute distress.  HEENT: normocephalic atraumatic , Eyes: PERRL EOM's full, conjunctiva clear, Nares: paten,t no deformity discharge or tenderness., Ears: Hearing aids no deformity EAC's clear TMs with normal landmarks. Mouth: Masked NECK: supple without masses, thyromegaly or bruits. CHEST/PULM:  Clear to auscultation and percussion breath sounds equal no wheeze , rales or rhonchi. No chest wall deformities or tenderness. Breast: Per GYN CV: PMI is nondisplaced, S1 S2 no gallops, murmurs, rubs. Peripheral pulses are full without delay.No JVD .  ABDOMEN: Bowel sounds normal nontender  No guard or rebound, no hepato splenomegal no CVA tenderness.   Extremtities:  No clubbing cyanosis or edema, no acute joint swelling or redness no focal atrophy NEURO:  Oriented x3, cranial nerves 3-12 appear to be intact, no obvious focal  weakness,gait within normal limits no abnormal reflexes or asymmetrical SKIN: No acute rashes normal turgor, color, no bruising or  petechiae. PSYCH: Oriented, good eye contact, no obvious depression anxiety, cognition and judgment appear normal. LN: no cervical axillary inguinal adenopathy  Lab Results  Component Value Date   WBC 7.3 12/24/2020   HGB 14.0 12/24/2020   HCT 40.0 12/24/2020   PLT 250.0 12/24/2020   GLUCOSE 94 12/24/2020   CHOL 222 (H) 07/14/2018   TRIG 88.0 07/14/2018   HDL 64.80 07/14/2018   LDLDIRECT 138.7 07/27/2012   LDLCALC 139 (H) 07/14/2018   ALT 18 12/24/2020   AST 15 12/24/2020   NA 140 12/24/2020   K 4.1 12/24/2020   CL 107 12/24/2020   CREATININE 0.81 12/24/2020   BUN 20 12/24/2020   CO2 23 12/24/2020   TSH 4.18 08/31/2018    BP Readings from Last 3 Encounters:  07/03/21 104/60  06/25/21 114/62  06/24/21 124/61    Lab plan reviewed with patient   ASSESSMENT AND PLAN:  Discussed the following assessment and plan:    ICD-10-CM   1. Visit for preventive health examination  Z00.00     2. Elevated cholesterol  E78.00     3. Family history of hemochromatosis  Z83.49     4. 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 History of COVID-19  Z86.16     She will make a fasting lab appointment and get a flu shot at the same time in the near future.  Her cholesterol has been in creased although healthy lifestyle will monitor. Glad that her eye symptoms are improved and follow-up with her eye specialist. Because she is so healthy she could consider a repeat Cologuard if covered by insurance. Return for fasting lab   appt and flu injection  then yearly check .  Patient Care Team: Zaccai Chavarin, Standley Brooking, MD as PCP - General (Internal Medicine) [place], Amy, MD as Consulting Physician (Dermatology) Megan Salon, MD as Consulting Physician (Gynecology) Patient Instructions  Glad you are doing well today .    Get  fasting lab appt and   flu shot appt in future  .  Will check cholesterol and  iron levels.   Continue lifestyle intervention healthy eating and exercise .   If all ok then yearly check .    Standley Brooking. Kyston Gonce M.D.

## 2021-07-03 NOTE — Patient Instructions (Signed)
Glad you are doing well today .    Get fasting lab appt and   flu shot appt in future  .  Will check cholesterol and  iron levels.   Continue lifestyle intervention healthy eating and exercise .   If all ok then yearly check .

## 2021-07-08 NOTE — Progress Notes (Signed)
   HPI: 75 y.o. female presenting today for concern regarding an ingrown toenail to the left second toe.  She says that she is unable to get the ingrown toenail out.  She mention she had the procedure performed in 2020.  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   Hx of varicella    HX: benign breast biopsy    Left rotator cuff tear 01/14/2012   Supraspinatus tear noted on ultrasound. Provided instructions regarding nitroglycerin patches 1/4 patch daily to shoulder.   To continue circular motion exercises and range of motion. No over the head exercises.  No weight bearing on that side. She can continue to play tennis as tolerated.  FU in 2 weeks if no improvement, 4 weeks if improved.       Shoulder pain, left 12/31/2011   History of rotator cuff tear on the right and now has nontraumatic left shoulder pain    Syncope    June, 2013   Uterine prolapse      Physical Exam: General: The patient is alert and oriented x3 in no acute distress.  Dermatology: Skin is warm, dry and supple bilateral lower extremities. Negative for open lesions or macerations.  There is some flare of the nail plate to the left second toe which appears to be intruding to the medial nail fold.  Associated tenderness to palpation  Vascular: Palpable pedal pulses bilaterally. No edema or erythema noted. Capillary refill within normal limits.  Neurological: Epicritic and protective threshold grossly intact bilaterally.   Musculoskeletal Exam: No pedal deformity  noted   Assessment: 1.  Mild ingrowing toenail left second digit medial border   Plan of Care:  1. Patient evaluated. 2.  Today we discussed different treatment options including nail matricectomy versus nail avulsion versus simple debridement of the nail plate.  The patient opts for simple debridement.  The offending border of the nail was debrided away using nail nippers without incident or bleeding.  The patient felt immediate relief. 3.  Return to clinic as needed      Edrick Kins, DPM [facility]  Dr. Edrick Kins, DPM    [address], [place] [address]                Office [phone]  Fax [phone]

## 2021-07-23 ENCOUNTER — Ambulatory Visit: Payer: Medicare Other | Admitting: Sports Medicine

## 2021-07-23 ENCOUNTER — Encounter: Payer: Self-pay | Admitting: Sports Medicine

## 2021-07-23 DIAGNOSIS — M19079 Primary osteoarthritis, unspecified ankle and foot: Secondary | ICD-10-CM | POA: Diagnosis not present

## 2021-07-23 NOTE — Assessment & Plan Note (Addendum)
Patient exhibits collapse of metatarsal plate in setting of midfoot arthritis with bony changes evident on gross examination. Patient will likely need custom orthotics for arch support and to correct lateral preference during gait. Today patient received shoe inserts with padding for the longitudinal arch of her left foot, heel padding and support for lateral aspect of her left foot Plan to follow up in 1 month after playing pickleball and daily walking

## 2021-07-23 NOTE — Progress Notes (Signed)
   PCP: Burnis Medin, MD  Subjective:   HPI: Patient is a 75 y.o. female here for left toe changes.  Patient reports that she noticed that her second and third digits of her left foot appeared to be directed more laterally in the last year.  She also reports that on the second toe she has an ingrown toenail that she has been evaluated for by podiatry.  This ingrown toenail continues to cause her significant pain and she is also noticed that it is red.  She did have a procedure to cut part of the toenail but states that she continues to have pain.  Regards to her foot, she reports that she often wakes with symptoms of left tar fasciitis.  Patient reports that she walks 4 miles per day and states the pain gets better however she continues to have pain in the morning.  She reports that she wears inserts in his athletic shoes and uses arch support. She also reports have a history of arthritis in her left foot.      Objective:  Physical Exam: Ht 5' 4.5" (1.638 m)   Wt 143 lb (64.9 kg)   LMP 10/28/1995   BMI 24.17 kg/m  [place] Adult Exercise 07/23/2021  Frequency of aerobic exercise (# of days/week) 6  Average time in minutes 60  Frequency of strengthening activities (# of days/week) 2    Gen: awake, alert, NAD, comfortable in exam room Pulm: breathing unlabored  Foot: Left TMT bossing and spurring involving TMT 1 and 2 Navicular drop with pronation at midfoot Subluxation of 5th MTP so that small toe rotates laterally Loss of transverse arch with flattening over toes 2 - 4 and MTPs  Ankle Full  ROM of the ankle, patient reports some stiffness with left ankle ROM vs right ankle . Strength: 5/5 strength ankle in all planes Neurovascular: N/V intact distally in the lower extremity Special tests: Negative anterior drawer. Negative squeeze. normal midfoot flexibility    Assessment & Plan:    Arthritis of midfoot, left Patient exhibits collapse of metatarsal plate  in setting of midfoot arthritis with bony changes evident on gross examination. Patient will likely need custom orthotics for arch support and to correct lateral preference during gait. Today patient received shoe inserts with padding for the longitudinal arch of her left foot, heel padding and support for lateral aspect of her left foot Plan to follow up in 1 month after playing pickleball and daily walking    Loss of longitudinal and transverse arch  Once we placed her in sports insoles with scaphoid pad/ lateral column post and lateral heel wedge her gait looked improved   Eulis Foster, MD [place], PGY-3 07/23/2021 12:33 PM  I observed and examined the patient with the resident and agree with assessment and plan.  Note reviewed and modified by me. Ila Mcgill, MD

## 2021-07-29 ENCOUNTER — Ambulatory Visit: Payer: Medicare Other

## 2021-07-29 ENCOUNTER — Other Ambulatory Visit (INDEPENDENT_AMBULATORY_CARE_PROVIDER_SITE_OTHER): Payer: Medicare Other

## 2021-07-29 ENCOUNTER — Other Ambulatory Visit: Payer: Self-pay

## 2021-07-29 DIAGNOSIS — E7800 Pure hypercholesterolemia, unspecified: Secondary | ICD-10-CM

## 2021-08-13 ENCOUNTER — Ambulatory Visit: Payer: Medicare Other | Admitting: Sports Medicine

## 2021-08-13 ENCOUNTER — Ambulatory Visit: Payer: Self-pay | Admitting: Sports Medicine

## 2021-08-13 ENCOUNTER — Ambulatory Visit (INDEPENDENT_AMBULATORY_CARE_PROVIDER_SITE_OTHER): Payer: Medicare Other

## 2021-08-13 ENCOUNTER — Other Ambulatory Visit: Payer: Self-pay

## 2021-08-13 ENCOUNTER — Ambulatory Visit: Payer: Medicare Other

## 2021-08-13 DIAGNOSIS — Z23 Encounter for immunization: Secondary | ICD-10-CM | POA: Diagnosis not present

## 2021-08-19 ENCOUNTER — Ambulatory Visit: Payer: Medicare Other | Admitting: Podiatry

## 2021-08-19 ENCOUNTER — Other Ambulatory Visit: Payer: Self-pay

## 2021-08-19 DIAGNOSIS — L600 Ingrowing nail: Secondary | ICD-10-CM | POA: Diagnosis not present

## 2021-08-19 NOTE — Progress Notes (Signed)
   HPI: 75 y.o. female presenting today for concern regarding an ingrown toenail to the left second toe.  She says that she is unable to get the ingrown toenail out.  She mention she had the procedure performed in 2020.  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   Hx of varicella    HX: benign breast biopsy    Left rotator cuff tear 01/14/2012   Supraspinatus tear noted on ultrasound. Provided instructions regarding nitroglycerin patches 1/4 patch daily to shoulder.   To continue circular motion exercises and range of motion. No over the head exercises.  No weight bearing on that side. She can continue to play tennis as tolerated.  FU in 2 weeks if no improvement, 4 weeks if improved.       Shoulder pain, left 12/31/2011   History of rotator cuff tear on the right and now has nontraumatic left shoulder pain    Syncope    June, 2013   Uterine prolapse      Physical Exam: General: The patient is alert and oriented x3 in no acute distress.  Dermatology: Skin is warm, dry and supple bilateral lower extremities. Negative for open lesions or macerations.  There is some flare of the nail plate to the left second toe which appears to be intruding to the medial nail fold.  Associated tenderness to palpation  Vascular: Palpable pedal pulses bilaterally. No edema or erythema noted. Capillary refill within normal limits.  Neurological: Epicritic and protective threshold grossly intact bilaterally.   Musculoskeletal Exam: No pedal deformity  noted   Assessment: 1.  Mild ingrowing toenail left second digit medial border   Plan of Care:  1. Patient evaluated. 2.  Today we discussed different treatment options including nail matricectomy versus nail avulsion versus simple debridement of the nail plate.  The patient opts for simple debridement.  The offending border of the nail was debrided away using nail nippers without incident or bleeding.  The patient felt immediate relief. 3.  Recommend OTC urea 50% nail gel available on [employer] 4. Return to clinic as needed      Edrick Kins, DPM [facility]  Dr. Edrick Kins, DPM    [address], [place] [address]                Office [phone]  Fax [phone]

## 2021-08-27 ENCOUNTER — Other Ambulatory Visit: Payer: Self-pay

## 2021-08-27 ENCOUNTER — Ambulatory Visit: Payer: Medicare Other | Admitting: Sports Medicine

## 2021-08-27 VITALS — BP 110/62 | Ht 65.0 in | Wt 133.0 lb

## 2021-08-27 DIAGNOSIS — R269 Unspecified abnormalities of gait and mobility: Secondary | ICD-10-CM | POA: Diagnosis not present

## 2021-08-27 DIAGNOSIS — M19079 Primary osteoarthritis, unspecified ankle and foot: Secondary | ICD-10-CM | POA: Diagnosis not present

## 2021-08-27 NOTE — Assessment & Plan Note (Signed)
Doing well with sports insoles Lateral post Lateral wedge  Scaphoid counter pad  Will plan long term use of these corrections

## 2021-08-27 NOTE — Progress Notes (Signed)
PCP: Burnis Medin, MD  Subjective:   HPI: Patient is a 75 y.o. female here for follow-up of left foot pain.  In her last visit, she was placed in new green sports insoles with additional padding and wedges.  She found this modification very helpful.  She has been able to walk and play pickle ball without pain in her feet.  She is no longer waking with pain in the left plantar fascia.  She denies any new injuries. Overall, she feels that the green insoles very comfortable.  She is inquiring about an additional pair today to be made for her pickleball shoes.  She is aware that she can switch these in terms out between shoes. Otherwise, she is doing well.  No Known Allergies  BP 110/62   Ht 5\' 5"  (1.651 m)   Wt 133 lb (60.3 kg)   LMP 10/28/1995   BMI 22.13 kg/m   [place] Adult Exercise 07/23/2021 08/27/2021  Frequency of aerobic exercise (# of days/week) 6 6  Average time in minutes 60 60  Frequency of strengthening activities (# of days/week) 2 2    No flowsheet data found.      Objective:  Physical Exam:  Gen: Well-appearing, in no acute distress; non-toxic CV: Regular Rate. Well-perfused. Warm.  Resp: Breathing unlabored on room air; no wheezing. Psych: Fluid speech in conversation; appropriate affect; normal thought process Neuro: Sensation intact throughout. No gross coordination deficits.  MSK:    - Left foot: Preserved longitudinal arch with sitting; there is loss of the transverse arch with flattening over toes 2 through 4 upon standing. + Splay side between 2-3 toes.  There is no erythema, ecchymosis or swelling.  There is notable TMT bossing involving TMT 1 and 2.  Navicular drop present with pronation at the midfoot.  Neurovascular intact.  Full range of motion and 5/5 strength in all directions  - Gait: Patient's supination is improved with the inserts and lateral heel wedge/fifth ray post.  Appropriate heel to forefoot transfer with some pronation,  although excessive pronation stopped with scaphoid pad.  Nonantalgic.  Patient's gait felt very comfortable with shoe/insert modification.    Assessment & Plan:  1. Arthritis, midfoot left 2. Over-supination of gait, gait abnormality  Patient's pain has resolved with shoe insert and shoe modification.  She was requesting additional insoles with modification so that she could put these into her pickleball shoes.  We provided her with additional green sports insoles with the left insert with scaphoid pad, lateral 5th ray post, lateral heel wedge.  She found these comfortable.  Patient was instructed that if these begin breaking down, she does not need appointment but can drop off the inserts at the office and we can repeat make these as needed.   Elba Barman, DO PGY-4, Sports Medicine Fellow [place]  I observed and examined the patient with the [facility] resident and agree with assessment and plan.  Note reviewed and modified by me. Ila Mcgill, MD

## 2021-08-30 ENCOUNTER — Telehealth: Payer: Self-pay | Admitting: Internal Medicine

## 2021-08-30 NOTE — Telephone Encounter (Signed)
Left message for patient to call back and schedule Medicare Annual Wellness Visit (AWV) either virtually or in office. Left  my Herbie Drape number [phone]   Last AWV ;05/26/16   please schedule at anytime with [facility] Nurse Health Advisor 1 or 2   This should be a 45 minute visit.

## 2021-09-04 ENCOUNTER — Encounter: Payer: Self-pay | Admitting: Internal Medicine

## 2021-09-04 ENCOUNTER — Telehealth (INDEPENDENT_AMBULATORY_CARE_PROVIDER_SITE_OTHER): Payer: Medicare Other | Admitting: Internal Medicine

## 2021-09-04 ENCOUNTER — Telehealth: Payer: Medicare Other | Admitting: Internal Medicine

## 2021-09-04 VITALS — Ht 65.0 in | Wt 133.0 lb

## 2021-09-04 DIAGNOSIS — R194 Change in bowel habit: Secondary | ICD-10-CM

## 2021-09-04 DIAGNOSIS — R195 Other fecal abnormalities: Secondary | ICD-10-CM | POA: Diagnosis not present

## 2021-09-04 DIAGNOSIS — R159 Full incontinence of feces: Secondary | ICD-10-CM | POA: Diagnosis not present

## 2021-09-04 NOTE — Progress Notes (Signed)
Virtual Visit via Video Note  I connected withNAME@ on 09/04/21 at 12:30 PM EST by a video enabled telemedicine application and verified that I am speaking with the correct person using two identifiers. Location patient: home Location provider:work office Persons participating in the virtual visit: patient, provider  [place] national recommendations  regarding COVID 19 pandemic   video visit is advised over in office visit for this patient.  Patient aware  of the limitations of evaluation and management by telemedicine and  availability of in person appointments. and agreed to proceed.   HPI: Laura Curtis presents for video visit because of worsening of a change in bowel habit most recently. Normal bowel habits before knee surgery in 2020.  At which time she developed a dental abscess that would needed to be treated without surgical intervention to avoid knee infection.  Thus she was on antibiotics we believe amoxicillin for 3 months.  Ever since then her bowel habits had changed with frequency intermittent looseness but no blood no current water however recently she has had 2 "accidents" has to look for bathrooms when she goes for a walk avoid problems. Some could be related to diet she is tried probiotics without help.  There are no cramps or serious abdominal pain or fevers. She has never had a colonoscopy but did have a colon guard that was negative in the past.  No vomiting unintended weight loss fever cramps Regard to feet pain and gait , Dr. Oneida Alar evaluation has been quite helpful of been quite helpful for the foot pain with shoe modification.  ROS: See pertinent positives and negatives per HPI.  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   Hx of varicella    HX: benign breast biopsy    Left rotator cuff tear 01/14/2012   Supraspinatus tear noted on ultrasound. Provided instructions regarding nitroglycerin patches 1/4 patch daily to shoulder.   To continue circular motion exercises and range of motion. No over the head exercises.  No weight bearing on that side. She can continue to play tennis as tolerated.  FU in 2 weeks if no improvement, 4 weeks if improved.       Shoulder pain, left 12/31/2011   History of rotator cuff tear on the right and now has nontraumatic left shoulder pain    Syncope    June, 2013   Uterine prolapse     Past Surgical History:  Procedure Laterality Date   [place]     for squamous cell ca left nose   OVARIAN CYST SURGERY  1968   REPLACEMENT TOTAL KNEE Right    ROTATOR CUFF REPAIR  2001   TONSILLECTOMY AND ADENOIDECTOMY     TUBAL LIGATION      Family History  Problem Relation Age of Onset   Stroke Father        age 17   Other Mother  old age died 9    Deep vein thrombosis Mother     Social History   Tobacco Use   Smoking status: Never   Smokeless tobacco: Never  Vaping Use   Vaping Use: Never used  Substance Use Topics   Alcohol use: Yes    Alcohol/week: 2.0 - 3.0 standard drinks    Types: 2 - 3 Standard drinks or equivalent per week    Comment: occ   Drug use: No      Current Outpatient Medications:    estradiol (ESTRACE) 0.1 MG/GM vaginal cream, 1 gram vaginally twice weekly, Disp: 42.5 g, Rfl: 3   meloxicam (MOBIC) 7.5 MG tablet, Take 1-2 tablets (7.5-15 mg total) by mouth daily. As directed, Disp: 60 tablet, Rfl: 1   triamcinolone ointment (KENALOG) 0.5 %, Apply 1 application topically 2 (two) times daily., Disp: 30 g, Rfl: 0  EXAM: BP Readings from Last 3  Encounters:  08/27/21 110/62  07/03/21 104/60  06/25/21 114/62    VITALS per patient if applicable:  GENERAL: alert, oriented, appears well and in no acute distress  HEENT: atraumatic, conjunttiva clear, no obvious abnormalities on inspection of external nose and ears  NECK: normal movements of the head and neck  LUNGS: on inspection no signs of respiratory distress, breathing rate appears normal, no obvious gross SOB, gasping or wheezing  CV: no obvious cyanosis  MS: moves all visible extremities without noticeable abnormality  PSYCH/NEURO: pleasant and cooperative, no obvious depression or anxiety, speech and thought processing grossly intact Lab Results  Component Value Date   WBC 7.3 12/24/2020   HGB 14.0 12/24/2020   HCT 40.0 12/24/2020   PLT 250.0 12/24/2020   GLUCOSE 94 12/24/2020   CHOL 222 (H) 07/14/2018   TRIG 88.0 07/14/2018   HDL 64.80 07/14/2018   LDLDIRECT 138.7 07/27/2012   LDLCALC 139 (H) 07/14/2018   ALT 18 12/24/2020   AST 15 12/24/2020   NA 140 12/24/2020   K 4.1 12/24/2020   CL 107 12/24/2020   CREATININE 0.81 12/24/2020   BUN 20 12/24/2020   CO2 23 12/24/2020   TSH 4.18 08/31/2018    ASSESSMENT AND PLAN:  Discussed the following assessment and plan:    ICD-10-CM   1. Change in bowel habits  R19.4    began after 3 months of amoxicilling given for dental infection 2020. sx getting worse    2. Incontinence of feces, unspecified fecal incontinence type  R15.9    epsode    3. Loose stools  R19.5       Counseled.  Plan GI referral.  May need colonscopy.  Other eval ex  etc.  Expectant management and discussion of plan and treatment with opportunity to ask questions and all were answered. The patient agreed with the plan and demonstrated an understanding of the instructions.   Advised to call back or seek an in-person evaluation if worsening  or having  further concerns  in interim.  Never got blood done as tech unable to easily obtain  blood sample so  she   did not proceed.  Return for when planned.    Shanon Ace, MD

## 2021-09-24 ENCOUNTER — Encounter: Payer: Self-pay | Admitting: Nurse Practitioner

## 2021-09-30 ENCOUNTER — Telehealth: Payer: Self-pay | Admitting: Internal Medicine

## 2021-09-30 ENCOUNTER — Encounter: Payer: Self-pay | Admitting: Internal Medicine

## 2021-09-30 NOTE — Telephone Encounter (Signed)
Spoke with patient to schedule Medicare Annual Wellness Visit (AWV) either virtually or in office.   Patient wanted a call back 05/2022  Last AWV 05/26/16  please schedule at anytime with [facility] Nurse Health Advisor 1 or 2   This should be a 45 minute visit.

## 2021-10-04 ENCOUNTER — Ambulatory Visit: Payer: Medicare Other | Admitting: Nurse Practitioner

## 2021-10-04 ENCOUNTER — Ambulatory Visit
Admission: RE | Admit: 2021-10-04 | Discharge: 2021-10-04 | Disposition: A | Discharge status: Home / Self Care | Payer: Medicare Other | Source: Ambulatory Visit | Attending: Family Medicine | Admitting: Family Medicine

## 2021-10-04 ENCOUNTER — Ambulatory Visit: Payer: Medicare Other | Admitting: Family Medicine

## 2021-10-04 VITALS — BP 108/70 | Ht 64.5 in | Wt 136.0 lb

## 2021-10-04 DIAGNOSIS — M180 Bilateral primary osteoarthritis of first carpometacarpal joints: Secondary | ICD-10-CM

## 2021-10-04 DIAGNOSIS — M19031 Primary osteoarthritis, right wrist: Secondary | ICD-10-CM | POA: Diagnosis not present

## 2021-10-04 DIAGNOSIS — M19041 Primary osteoarthritis, right hand: Secondary | ICD-10-CM | POA: Diagnosis not present

## 2021-10-04 DIAGNOSIS — M47812 Spondylosis without myelopathy or radiculopathy, cervical region: Secondary | ICD-10-CM

## 2021-10-04 DIAGNOSIS — M79645 Pain in left finger(s): Secondary | ICD-10-CM | POA: Diagnosis not present

## 2021-10-04 DIAGNOSIS — G5631 Lesion of radial nerve, right upper limb: Secondary | ICD-10-CM

## 2021-10-04 DIAGNOSIS — R531 Weakness: Secondary | ICD-10-CM | POA: Diagnosis not present

## 2021-10-04 MED ORDER — GABAPENTIN 100 MG PO CAPS: 100.0000 mg | ORAL_CAPSULE | Freq: Every day | ORAL | 1 refills | Status: DC

## 2021-10-04 NOTE — Progress Notes (Signed)
Laura Curtis is a 75 y.o. female who presents to [facility] today for the following:  R>L Hand Pain 3 months of worsening chronic b/l hand pain Pain is radial She is right-hand dominant Reports that the dorsal aspect of her first 2 digits experience pins-and-needles Also reports that she has noticed that her right thumb is in an adducted position No specific injury that she is aware of Does not think that she is dropping things, but does overall feel weaker Also reports when she plays pickle ball she is noticed decrease in her accuracy States that she has difficulty undoing jars States that 4 days ago she was playing pickle ball and had to stop because the tingling was so bad and her accuracy was not good, she has not played pickle ball since then, but regularly plays every day She has taken meloxicam daily for the last 18 months but does not think that this is helping her pain   PMH reviewed.  ROS as above. Medications reviewed.  Exam:  BP 108/70   Ht 5' 4.5" (1.638 m)   Wt 136 lb (61.7 kg)   LMP 10/28/1995   BMI 22.98 kg/m  Gen: Well NAD MSK:  Bilateral Hands:  Inspection: Prominent right 1st [place] with thumb adducted.  No swelling, erythema or bruising b/l Palpation: TTP over right 1st CMC and along radial nerve distribution, no significant TTP of left hand ROM: Full ROM of the digits and wrist b/l aside from decreased flexion and extension of right thumb by 10 degrees in both directions Strength: 5/5 strength in the shoulder, forearm, wrist and interosseus muscles b/l.  There is slight weakness of thumb extension on right. OK sign intact Neurovascular: NV intact b/l, 2+ DTR biceps, triceps, brachioradialis b/l Special tests: Negative finkelstein's, negative tinel's at the carpal tunnel  Neck/Back: - Inspection: no gross deformity or asymmetry, swelling or ecchymosis - Palpation: No TTP spinous process - ROM: full active ROM of the cervical spine with neck extension,  rotation, flexion without pain - Strength: per above - Neuro: per above - Special testing: positive slump test, negative spurling's  Limited Ultrasound of the right dorsal wrist: CMC: significant arthritic changes of 1st CMC with effusion noted Compartment 1: Normal-appearing abductor pollicis longus and extensor pollicis brevis without tenosynovitis Notable TTP and reproduction of symptoms with tracing of radial nerve dynamically on [place] Left CMC also with significant arthritic changes, but minimal effusion  Impression: 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   Assessment and Plan: 1) Primary osteoarthritis of both first carpometacarpal joints There is significant arthritis of the bilateral CMC's, right worse than left noted on ultrasound today, will obtain x-rays to further assess.  It is possible that the swelling of her right first [facility] could be causing compressive neuropathy of the radial nerve, as it seems that her symptoms are worsened by palpation of this radial nerve and symptoms do seem to be neurogenic.  Less likely to be related to her neck, however will obtain x-rays of her neck as well.  Given unclear cause of her neurogenic symptoms, we will order an EMG as well.  We will start her on gabapentin and titrate up to total 600 mg daily.  Follow-up in 1 month.  If EMG does not reveal any cause of her symptoms, would consider an MRI to further evaluate.  Discussed with her that if this does seem to  be related to her [place] arthritis, could consider ultrasound-guided injection and if this is not helpful could also consider hand surgery referral.   [place] Constable, D.O.  PGY-4 [facility]  10/04/2021 12:00 PM

## 2021-10-04 NOTE — Patient Instructions (Addendum)
[facility] [address], [place], [place] [address] Phone: [phone]  They will call you to schedule an appt. Please let us know if you don't get a call from them in 1 week.  We will also start you on gabapentin.  Take 100mg  once a day (at night) for a week, then increase to 300mg  once a day for two weeks.  After this, if you are tolerating the medicine well, you can increase to 300mg  twice a day.   I have placed an order for x-rays of your hands and your neck.  Please go to [facility] to have this completed.  You do not need an appointment.  We will contact you with your results afterwards.   Come back to see [place] in about 1 month.

## 2021-10-04 NOTE — Assessment & Plan Note (Signed)
There is significant arthritis of the bilateral CMC's, right worse than left noted on ultrasound today, will obtain x-rays to further assess.  It is possible that the swelling of her right first [facility] could be causing compressive neuropathy of the radial nerve, as it seems that her symptoms are worsened by palpation of this radial nerve and symptoms do seem to be neurogenic.  Less likely to be related to her neck, however will obtain x-rays of her neck as well.  Given unclear cause of her neurogenic symptoms, we will order an EMG as well.  We will start her on gabapentin and titrate up to total 600 mg daily.  Follow-up in 1 month.  If EMG does not reveal any cause of her symptoms, would consider an MRI to further evaluate.  Discussed with her that if this does seem to be related to her [facility] arthritis, could consider ultrasound-guided injection and if this is not helpful could also consider hand surgery referral.

## 2021-10-07 ENCOUNTER — Encounter: Payer: Self-pay | Admitting: Family Medicine

## 2021-10-07 NOTE — Progress Notes (Signed)
[facility]: Attending Note: I have reviewed the chart, discussed wit the Sports Medicine Fellow. I agree with assessment and treatment plan as detailed in the [place] note. Very interesting case.  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  I am not 100% sure that the wasting is from the [facility] arthritis and subsequent lack of movement of the joint, given her extreme sensitivity to very mild palpation with the ultrasound probe over her radial nerve, I think we need to investigate with nerve conduction studies.  If it is just [facility] arthritis, could consider corticosteroid injection and/or referral to hand surgery.  I did briefly discussed with her joint replacement for CMC arthritis.  She is amenable to this plan.  We will set up nerve conduction studies.  We will also get some general x-rays of bilateral thumbs to give [place] an idea of where the arthritis is and its progression.

## 2021-10-31 ENCOUNTER — Ambulatory Visit: Payer: Medicare Other | Admitting: Sports Medicine

## 2021-10-31 VITALS — BP 114/64 | Ht 64.5 in | Wt 138.0 lb

## 2021-10-31 DIAGNOSIS — M47812 Spondylosis without myelopathy or radiculopathy, cervical region: Secondary | ICD-10-CM

## 2021-10-31 MED ORDER — AMITRIPTYLINE HCL 25 MG PO TABS: 25.0000 mg | ORAL_TABLET | Freq: Every day | ORAL | 1 refills | Status: DC

## 2021-10-31 MED ORDER — MELOXICAM 7.5 MG PO TABS: 7.5000 mg | ORAL_TABLET | Freq: Every day | ORAL | 0 refills | Status: DC

## 2021-10-31 NOTE — Progress Notes (Signed)
° °  Laura Curtis is a 76 y.o. female who presents to [facility] today for the following:  Right hand pain Patient last seen for the same on 12/9 At that time an ultrasound was performed and symptoms seem to be related to radial nerve on the right X-rays performed that show right advanced first CMC arthritis and left moderate first CMC arthritis Cervical spine x-rays show advanced degenerative changes through C6-C7 She states that she was unable to tolerate gabapentin She was sent for EMG, however they never called and she did not have this performed States that she continues to have pins-and-needles mostly in the dorsal aspect of her first 2 digits and also into the tip of her thumb States that the pain worsens significantly at nighttime while she is sleeping If she sits upright the pain gets better Sometimes the pain also radiates into the forearm No injuries   PMH reviewed.  ROS as above. Medications reviewed.  Exam:  BP 114/64    Ht 5' 4.5" (1.638 m)    Wt 138 lb (62.6 kg)    LMP 10/28/1995    BMI 23.32 kg/m  Gen: Well NAD MSK:  Right hand:  Inspection: Prominent first [place] with thumb abducted and some slight thenar atrophy Palpation: There is some mild tenderness palpation over the first [facility], however the pain is significantly worse moving just proximal to this area along the radial nerve distribution Neurovascularly intact distally   Assessment and Plan: 1) Cervical spine arthritis X-rays and examination are consistent with a C5-C6 radiculopathy.  This is supported by the fact that when she is lying down at night her pain worsens and when she is able to have a neutral spine her pain improves.  Given that she did not tolerate gabapentin, will trial amitriptyline 25 mg nightly and also advised to take vitamin B6 and use Arnica gel as this is vasoactive and may be more helpful for nerve healing.  Would hold off on EMGs at this time given it would not currently change management.  We will  have her follow-up in 4 to 6 weeks to ensure that she is improving.  While she does have significant for CMC arthritis, this does not seem to be the source of her pain.   [place] Constable, D.O.  PGY-4 [facility]  10/31/2021 3:26 PM  I observed and examined the patient with the [facility] resident and agree with assessment and plan.  Note reviewed and modified by me. Ila Mcgill, MD

## 2021-10-31 NOTE — Patient Instructions (Signed)
Thank you for coming to see me today. It was a pleasure. Today we talked about:   We will start you on amitriptyline 25 mg at night.  This can help with nerve pain.  You can also pick up vitamin B6 supplementation over-the-counter and Arnica gel to use topically on the area that is painful to you.  In the meantime, we will hold off on your nerve conduction study.  Watch how your neck is positioned by your sleep and try to keep your neck from pinching on the right.  Please follow-up with [place] in 4-6 weeks.  If you have any questions or concerns, please do not hesitate to call the office at [phone].  Best,   Arizona Constable, DO [place]

## 2021-10-31 NOTE — Assessment & Plan Note (Signed)
X-rays and examination are consistent with a C5-C6 radiculopathy.  This is supported by the fact that when she is lying down at night her pain worsens and when she is able to have a neutral spine her pain improves.  Given that she did not tolerate gabapentin, will trial amitriptyline 25 mg nightly and also advised to take vitamin B6 and use Arnica gel as this is vasoactive and may be more helpful for nerve healing.  Would hold off on EMGs at this time given it would not currently change management.  We will have her follow-up in 4 to 6 weeks to ensure that she is improving.  While she does have significant for CMC arthritis, this does not seem to be the source of her pain.

## 2021-12-10 ENCOUNTER — Ambulatory Visit: Payer: Medicare Other | Admitting: Sports Medicine

## 2021-12-18 DIAGNOSIS — G5601 Carpal tunnel syndrome, right upper limb: Secondary | ICD-10-CM | POA: Diagnosis not present

## 2021-12-18 DIAGNOSIS — M13831 Other specified arthritis, right wrist: Secondary | ICD-10-CM | POA: Diagnosis not present

## 2021-12-18 DIAGNOSIS — M13841 Other specified arthritis, right hand: Secondary | ICD-10-CM | POA: Diagnosis not present

## 2021-12-23 ENCOUNTER — Other Ambulatory Visit: Payer: Self-pay

## 2021-12-23 ENCOUNTER — Ambulatory Visit: Payer: Medicare Other | Admitting: Podiatry

## 2021-12-23 DIAGNOSIS — L600 Ingrowing nail: Secondary | ICD-10-CM | POA: Diagnosis not present

## 2021-12-23 MED ORDER — GENTAMICIN SULFATE 0.1 % EX CREA: 1.0000 "application " | TOPICAL_CREAM | Freq: Two times a day (BID) | CUTANEOUS | 1 refills | Status: DC

## 2021-12-23 NOTE — Progress Notes (Signed)
Subjective: Patient presents today for evaluation of pain to the medial border left second toe. Patient is concerned for possible ingrown nail.  It is very sensitive to touch.  Patient presents today for further treatment and evaluation.  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   Hx of varicella    HX: benign breast biopsy    Left rotator cuff tear 01/14/2012   Supraspinatus tear noted on ultrasound. Provided instructions regarding nitroglycerin patches 1/4 patch daily to shoulder.   To continue circular motion exercises and range of motion. No over the head exercises.  No weight bearing on that side. She can continue to play tennis as tolerated.  FU in 2 weeks if no improvement, 4 weeks if improved.       Shoulder pain, left 12/31/2011   History of rotator cuff tear on the right and now has nontraumatic left shoulder pain    Syncope    June, 2013   Uterine prolapse     Objective:  General: Well developed, nourished, in no acute distress, alert and oriented x3   Dermatology: Skin is warm, dry and supple bilateral.  Medial border left second toe appears to be erythematous with evidence of an ingrowing nail. Pain on palpation noted to the border of the nail fold. The remaining nails appear unremarkable at this time. There are no open sores, lesions.  Vascular: Dorsalis Pedis artery and Posterior Tibial artery pedal pulses palpable. No lower extremity edema noted.   Neruologic: Grossly intact via light touch bilateral.  Musculoskeletal: Muscular strength within normal limits in all groups  bilateral. Normal range of motion noted to all pedal and ankle joints.   Assesement: #1 Paronychia with ingrowing nail medial border left second toe #2 Pain in toe  Plan of Care:  1. Patient evaluated.  2. Discussed treatment alternatives and plan of care. Explained nail avulsion procedure and post procedure course to patient. 3. Patient opted for permanent partial nail avulsion of the ingrown portion of the nail.  4. Prior to procedure, local anesthesia infiltration utilized using 3 ml of a 50:50 mixture of 2% plain lidocaine and 0.5% plain marcaine in a normal hallux block fashion and a betadine prep performed.  5. Partial permanent nail avulsion with chemical matrixectomy performed using [id number] applications of phenol followed by alcohol flush.  6. Light dressing applied.  Post care instructions provided 7.  Prescription for gentamicin 2% cream  8.  Return to clinic 2 weeks.  Edrick Kins, DPM [facility]  Dr. Edrick Kins, DPM    [address], [place] [address]                Office [phone]  Fax [phone]

## 2022-01-08 DIAGNOSIS — M13841 Other specified arthritis, right hand: Secondary | ICD-10-CM | POA: Diagnosis not present

## 2022-01-08 DIAGNOSIS — G5601 Carpal tunnel syndrome, right upper limb: Secondary | ICD-10-CM | POA: Diagnosis not present

## 2022-01-08 DIAGNOSIS — M13831 Other specified arthritis, right wrist: Secondary | ICD-10-CM | POA: Diagnosis not present

## 2022-01-22 DIAGNOSIS — G5601 Carpal tunnel syndrome, right upper limb: Secondary | ICD-10-CM | POA: Diagnosis not present

## 2022-03-04 ENCOUNTER — Encounter (HOSPITAL_BASED_OUTPATIENT_CLINIC_OR_DEPARTMENT_OTHER): Payer: Self-pay | Admitting: Obstetrics & Gynecology

## 2022-03-04 ENCOUNTER — Ambulatory Visit (HOSPITAL_BASED_OUTPATIENT_CLINIC_OR_DEPARTMENT_OTHER): Payer: Medicare Other | Admitting: Obstetrics & Gynecology

## 2022-03-04 VITALS — BP 109/53 | HR 65 | Ht 64.5 in | Wt 141.4 lb

## 2022-03-04 DIAGNOSIS — N812 Incomplete uterovaginal prolapse: Secondary | ICD-10-CM

## 2022-03-04 DIAGNOSIS — N952 Postmenopausal atrophic vaginitis: Secondary | ICD-10-CM

## 2022-03-04 DIAGNOSIS — Z4689 Encounter for fitting and adjustment of other specified devices: Secondary | ICD-10-CM

## 2022-03-04 NOTE — Progress Notes (Signed)
GYNECOLOGY  VISIT ? ?CC:   vaginal discharge ? ?HPI: ?76 y.o. [id number] [name] female here for complaint of vaginal discharge that started since the early part of the year.  She has a pessary that she typically removes and replaces.  She has left the pessary out for three weeks and the discharge resolved.  However, she put the pessary back in two days ago and now there is now significant discharge.  She does have some changes with emptying her bladder.  Sometimes it takes a little while to get the stream started.  She denies dysuria.  Does feel like she empties completely.   ? ? ?Patient Active Problem List  ? Diagnosis Date Noted  ? Primary osteoarthritis of both first carpometacarpal joints 10/04/2021  ? Radial nerve compression, right 10/04/2021  ? Cervical spine arthritis 10/04/2021  ? Arthritis of midfoot, left 07/23/2021  ? S/P TKR (total knee replacement) using cement, right 12/07/2019  ? Vaginal atrophy 10/06/2019  ? Incomplete uterine prolapse 10/06/2019  ? Hearing aid worn   ? Hx of syncope 07/21/2012  ? Counseling on health promotion and disease prevention 07/21/2012  ? Hx of nonmelanoma skin cancer 07/21/2012  ? History of positive PPD   ? HX: benign breast biopsy   ? ? ?Past Medical History:  ?Diagnosis Date  ? Biceps tendinopathy 01/14/2012  ? Cancer [facility])   ? 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 Chronic pain of right knee 04/15/2013  ? Injected April 15, 2013  ? COVID-19   ? Hearing aid worn   ? Hepatitis A   ? Hip pain, right 12/31/2011  ? A mild amount of arthritis is probable with the limitation of rotation but the key finding today was weakness in abduction   ? History of positive PPD   ? felt secondary to bcg?  ? Hx of varicella   ? HX: benign breast biopsy   ? Left rotator cuff tear 01/14/2012  ? Supraspinatus tear noted on ultrasound. Provided instructions regarding nitroglycerin patches 1/4 patch daily to shoulder.   To continue circular motion exercises and range of motion. No over the  head exercises.  No weight bearing on that side. She can continue to play tennis as tolerated.  FU in 2 weeks if no improvement, 4 weeks if improved.      ? Shoulder pain, left 12/31/2011  ? History of rotator cuff tear on the right and now has nontraumatic left shoulder pain   ? Syncope   ? June, 2013  ? Uterine prolapse   ? ? ?Past Surgical History:  ?Procedure Laterality Date  ? APPENDECTOMY  1968  ? BREAST BIOPSY  1998  ? MOHS SURGERY    ? for squamous cell ca left nose  ? OVARIAN CYST SURGERY  1968  ? REPLACEMENT TOTAL KNEE Right   ? ROTATOR CUFF REPAIR  2001  ? TONSILLECTOMY AND ADENOIDECTOMY    ? TUBAL LIGATION    ? ? ?MEDS:   ?Current Outpatient Medications on File Prior to Visit  ?Medication Sig Dispense Refill  ? amitriptyline (ELAVIL) 25 MG tablet Take 1 tablet (25 mg total) by mouth at bedtime. 30 tablet 1  ? estradiol (ESTRACE) 0.1 MG/GM vaginal cream 1 gram vaginally twice weekly 42.5 g 3  ? gentamicin cream (GARAMYCIN) 0.1 % Apply 1 application topically 2 (two) times daily. 30 g 1  ? triamcinolone ointment (KENALOG) 0.5 % Apply 1 application topically 2 (two) times daily. 30 g 0  ? ?  No current facility-administered medications on file prior to visit.  ? ? ?ALLERGIES: Patient has no known allergies. ? ?Family History  ?Problem Relation Age of Onset  ? Stroke Father   ?     age 21  ? Other Mother   ?     old age died 57   ? Deep vein thrombosis Mother   ? ? ?SH:  married, non smoker ? ?Review of Systems  ?Genitourinary:   ?     Vaginal discharge  ? ?PHYSICAL EXAMINATION:   ? ?BP (!) 109/53 (BP Location: Left Arm, Patient Position: Sitting, Cuff Size: Normal)   Pulse 65   Ht 5' 4.5" (1.638 m) Comment: Reported  Wt 141 lb 6.4 oz (64.1 kg)   LMP 10/28/1995   BMI 23.90 kg/m?     ?General appearance: alert, cooperative and appears stated age ?Lymph:  no inguinal LAD noted ? ?Pelvic: External genitalia:  no lesions ?             Urethra:  normal appearing urethra with no masses, tenderness or lesions ?              Bartholins and Skenes: normal    ?             Vagina: normal appearing vagina with normal color and discharge, no lesions ?             Cervix: no lesions ?           ?Chaperone, Ezekiel Ina, RN, was present for exam. ? ?Assessment/Plan: ?1. Pessary maintenance ?- as discharge seems to occur after pessary is placed and pt has not had a replacement for her pessary in many, many years, feel this may be contributing.  She does have arthritis so needs a very flexible pessary.  #3 incontinence ring with support given to update pessary she currently has.  Placement done today with good fit.  Advised pt if discharge returns, she may benefit from using a size down as well.  She will monitor and let me know.   ? ?2. Incomplete uterine prolapse ?- no changes on exam today ? ?3. Vaginal atrophy ?- using a small amount of vaginal estradiol cream twice weekly.  Recommended using more cream to help with tissue integrity.  Can increase up to 1 gram twice weekly.  Does not need RF. ? ? ?

## 2022-03-07 DIAGNOSIS — L570 Actinic keratosis: Secondary | ICD-10-CM | POA: Diagnosis not present

## 2022-03-12 DIAGNOSIS — M13831 Other specified arthritis, right wrist: Secondary | ICD-10-CM | POA: Diagnosis not present

## 2022-03-12 DIAGNOSIS — M19039 Primary osteoarthritis, unspecified wrist: Secondary | ICD-10-CM | POA: Diagnosis not present

## 2022-03-12 DIAGNOSIS — M13841 Other specified arthritis, right hand: Secondary | ICD-10-CM | POA: Diagnosis not present

## 2022-03-12 DIAGNOSIS — G5601 Carpal tunnel syndrome, right upper limb: Secondary | ICD-10-CM | POA: Diagnosis not present

## 2022-03-15 DIAGNOSIS — N390 Urinary tract infection, site not specified: Secondary | ICD-10-CM | POA: Diagnosis not present

## 2022-03-17 ENCOUNTER — Telehealth: Payer: Self-pay

## 2022-03-17 NOTE — Telephone Encounter (Signed)
--  Caller reports pain when urinating and urine does not look clear. Denies fever. She states that symptoms started 36 hours ago.  03/15/2022 8:08:53 AM See HCP within 4 Hours (or PCP triage) Aram Candela, RN, [facility] [place]  03/17/22 1141 - LVM instructions for pt to return call if needed.

## 2022-03-25 ENCOUNTER — Encounter: Payer: Self-pay | Admitting: Internal Medicine

## 2022-03-25 NOTE — Telephone Encounter (Signed)
She can com in at 4 pm tomorrow  in person  or virtual on Thursday

## 2022-03-25 NOTE — Telephone Encounter (Signed)
Left voicemail to call the office.

## 2022-03-26 ENCOUNTER — Encounter: Payer: Self-pay | Admitting: Internal Medicine

## 2022-03-26 ENCOUNTER — Ambulatory Visit (INDEPENDENT_AMBULATORY_CARE_PROVIDER_SITE_OTHER): Payer: Medicare Other | Admitting: Internal Medicine

## 2022-03-26 VITALS — BP 110/66 | HR 56 | Temp 97.7°F | Ht 64.5 in | Wt 139.8 lb

## 2022-03-26 DIAGNOSIS — Z79899 Other long term (current) drug therapy: Secondary | ICD-10-CM | POA: Diagnosis not present

## 2022-03-26 DIAGNOSIS — Z8616 Personal history of COVID-19: Secondary | ICD-10-CM

## 2022-03-26 DIAGNOSIS — R079 Chest pain, unspecified: Secondary | ICD-10-CM | POA: Diagnosis not present

## 2022-03-26 DIAGNOSIS — M19049 Primary osteoarthritis, unspecified hand: Secondary | ICD-10-CM

## 2022-03-26 MED ORDER — MELOXICAM 7.5 MG PO TABS: 7.5000 mg | ORAL_TABLET | Freq: Every day | ORAL | 3 refills | Status: DC

## 2022-03-26 NOTE — Progress Notes (Signed)
Chief Complaint  Patient presents with   Medication Refill    Discuss plaxloid     HPI: Laura Curtis 76 y.o. come in for 3 issues  New chest pain  2-3 episodes   one  night awakening  and then 2 weeks ago  , no associated symptoms, not enough to stop but aware and stops spontaneously after about 5 monutes described as intense   squeezing and tights.    Sits down .    No gerd like .  No syncope current change in exercise tolerance but has positive family history would like cardiology evaluation to make sure not important.  Meloxicam rx requesting refill had been seeing Dr. Valrie Hart stopped the meloxicam not thinking it was helpful but her hands were getting stiff and painful so restarted it as 7.5 twice daily and had a good response.  Last prescription is ending and asked for refill.  They will be traveling to the [place] again in August last time I developed COVID infection and no healthcare was available in [place].  As their healthcare system is "failing".  Wondering if her prescription for Paxlovid before they go would be appropriate in case they need to take it.   ROS: See pertinent positives and negatives per HPI.  No current exercise intolerance.  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   Hx of varicella    HX: benign breast biopsy    Left rotator cuff tear 01/14/2012   Supraspinatus tear noted on ultrasound. Provided instructions regarding nitroglycerin patches 1/4 patch daily to shoulder.   To continue circular motion exercises and range of motion. No over the head exercises.  No  weight bearing on that side. She can continue to play tennis as tolerated.  FU in 2 weeks if no improvement, 4 weeks if improved.       Shoulder pain, left 12/31/2011   History of rotator cuff tear on the right and now has nontraumatic left shoulder pain    Syncope    June, 2013   Uterine prolapse     Family History  Problem Relation Age of Onset   Stroke Father        age 35   Other Mother        old age died 22    Deep vein thrombosis Mother     Social History   Socioeconomic History   Marital status: Married    Spouse name: Not on file   Number of children: Not on file   Years of education: Not on file   Highest education level: Not on file  Occupational History   Not on file  Tobacco Use   Smoking status: Never   Smokeless tobacco: Never  Vaping Use   Vaping Use: Never used  Substance and Sexual Activity   Alcohol use: Yes    Alcohol/week: 2.0 - 3.0 standard drinks    Types: 2 - 3 Standard drinks or equivalent per week  Comment: occ   Drug use: No   Sexual activity: Not Currently    Birth control/protection: None, Surgical    Comment: BTL  Other Topics Concern   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 Sees dentist on a regular basis.    etoh 3 x per week                Social Determinants of Health   Financial Resource Strain: Low Risk    Difficulty of Paying Living Expenses: Not hard at all  Food Insecurity: Unknown   Worried About Charity fundraiser in the Last Year: Patient refused   Arboriculturist in the Last Year: Patient refused  Transportation Needs: Unknown   Film/video editor (Medical): Patient refused   Film/video editor (Non-Medical): Patient refused  Physical Activity: Sufficiently Active   Days of Exercise per Week: 6 days    Minutes of Exercise per Session: 60 min  Stress: No Stress Concern Present   Feeling of Stress : Only a little  Social Connections: Unknown   Frequency of Communication with Friends and Family: More than three times a week   Frequency of Social Gatherings with Friends and Family: More than three times a week   Attends Religious Services: Patient refused   Active Member of Clubs or Organizations: Patient refused   Attends Music therapist: Not on file   Marital Status: Married    Outpatient Medications Prior to Visit  Medication Sig Dispense Refill   amitriptyline (ELAVIL) 25 MG tablet Take 1 tablet (25 mg total) by mouth at bedtime. 30 tablet 1   estradiol (ESTRACE) 0.1 MG/GM vaginal cream 1 gram vaginally twice weekly 42.5 g 3   gentamicin cream (GARAMYCIN) 0.1 % Apply 1 application topically 2 (two) times daily. 30 g 1   triamcinolone ointment (KENALOG) 0.5 % Apply 1 application topically 2 (two) times daily. 30 g 0   No facility-administered medications prior to visit.     EXAM:  BP 110/66 (BP Location: Left Arm, Patient Position: Sitting, Cuff Size: Normal)   Pulse (!) 56   Temp 97.7 F (36.5 C) (Oral)   Ht 5' 4.5" (1.638 m)   Wt 139 lb 12.8 oz (63.4 kg)   LMP 10/28/1995   SpO2 96%   BMI 23.63 kg/m   Body mass index is 23.63 kg/m. Points to right parasternal upper chest as the area of concern.  No tenderness today. GENERAL: vitals reviewed and listed above, alert, oriented, appears well hydrated and in no acute distress HEENT: atraumatic, conjunctiva  clear, no obvious abnormalities on inspection of external nose and ears  NECK: no obvious masses on inspection palpation  LUNGS: clear to auscultation bilaterally, no wheezes, rales or rhonchi, good air movement CV: HRRR, no clubbing cyanosis or  peripheral edema nl cap refill  MS: moves all extremities without noticeable focal  abnormality hands with mild arthritic changes DIP PIP Abdomen soft without  organomegaly guarding or rebound PSYCH: pleasant and cooperative, no obvious depression or anxiety Lab Results  Component Value Date   WBC 7.3 12/24/2020   HGB 14.0 12/24/2020   HCT 40.0 12/24/2020   PLT 250.0 12/24/2020  GLUCOSE 94 12/24/2020   CHOL 222 (H) 07/14/2018   TRIG 88.0 07/14/2018   HDL 64.80 07/14/2018   LDLDIRECT 138.7 07/27/2012   LDLCALC 139 (H) 07/14/2018   ALT 18 12/24/2020   AST 15 12/24/2020   NA 140 12/24/2020   K 4.1 12/24/2020   CL 107 12/24/2020   CREATININE 0.81 12/24/2020   BUN 20 12/24/2020   CO2 23 12/24/2020   TSH 4.18 08/31/2018   BP Readings from Last 3 Encounters:  03/26/22 110/66  03/04/22 (!) 109/53  10/31/21 114/64    ASSESSMENT AND PLAN:  Discussed the following assessment and plan:  Chest pain, unspecified type - Plan: Ambulatory referral to Cardiology  Medication management  Hand arthritis  History of COVID-19 Cardiology consult referral because of age and family history and no other explanation for 2 episodes of squeezing chest pain. Okay to refill her meloxicam risk-benefit. At this time would not feel comfortable prescribing Paxlovid "just in case " based on current data risk-benefit of medication and the fact that she has been immunized and has had the disease ,infection.  Her risk category is 2 However can ask again closer to time of travel for opinion -Patient advised to return or notify health care team  if  new concerns arise.  Patient Instructions  Good to see you today .   Refill meloxicam .   Will do referral to cardiology  but let us know if worsening .    Standley Brooking. Ferlando Lia M.D.

## 2022-03-26 NOTE — Patient Instructions (Addendum)
Good to see you today .   Refill meloxicam .   Will do referral to cardiology  but let us know if worsening .

## 2022-04-14 DIAGNOSIS — G5601 Carpal tunnel syndrome, right upper limb: Secondary | ICD-10-CM | POA: Diagnosis not present

## 2022-04-16 ENCOUNTER — Ambulatory Visit: Payer: Medicare Other | Admitting: Interventional Cardiology

## 2022-04-16 ENCOUNTER — Ambulatory Visit: Payer: Medicare Other | Admitting: Internal Medicine

## 2022-04-28 DIAGNOSIS — M25531 Pain in right wrist: Secondary | ICD-10-CM | POA: Diagnosis not present

## 2022-05-02 ENCOUNTER — Encounter: Payer: Self-pay | Admitting: *Deleted

## 2022-05-06 DIAGNOSIS — D485 Neoplasm of uncertain behavior of skin: Secondary | ICD-10-CM | POA: Diagnosis not present

## 2022-05-06 DIAGNOSIS — D225 Melanocytic nevi of trunk: Secondary | ICD-10-CM | POA: Diagnosis not present

## 2022-05-06 DIAGNOSIS — L821 Other seborrheic keratosis: Secondary | ICD-10-CM | POA: Diagnosis not present

## 2022-05-06 DIAGNOSIS — D1801 Hemangioma of skin and subcutaneous tissue: Secondary | ICD-10-CM | POA: Diagnosis not present

## 2022-05-28 ENCOUNTER — Ambulatory Visit (INDEPENDENT_AMBULATORY_CARE_PROVIDER_SITE_OTHER): Payer: Medicare Other

## 2022-05-28 DIAGNOSIS — R300 Dysuria: Secondary | ICD-10-CM

## 2022-05-28 LAB — POCT URINALYSIS DIPSTICK
Bilirubin, UA: NEGATIVE
Glucose, UA: NEGATIVE
Ketones, UA: NEGATIVE
Nitrite, UA: NEGATIVE
Protein, UA: NEGATIVE
Spec Grav, UA: 1.01 (ref 1.010–1.025)
Urobilinogen, UA: 0.2 E.U./dL
pH, UA: 6 (ref 5.0–8.0)

## 2022-05-28 MED ORDER — NITROFURANTOIN MONOHYD MACRO 100 MG PO CAPS: 100.0000 mg | ORAL_CAPSULE | Freq: Two times a day (BID) | ORAL | 0 refills | Status: DC

## 2022-05-28 MED ORDER — PHENAZOPYRIDINE HCL 200 MG PO TABS: 200.0000 mg | ORAL_TABLET | Freq: Three times a day (TID) | ORAL | 0 refills | Status: DC | PRN

## 2022-05-28 NOTE — Progress Notes (Signed)
Patient came in to give a urine sample for evaluation. Patient is experiencing frequent urination and dysuria. Urine sent off for culture. Sent in [place] '100mg'$  PO BID X5days and Pyridium '200mg'$  PO TID X2days. tbw

## 2022-05-31 LAB — URINE CULTURE

## 2022-06-04 ENCOUNTER — Encounter: Payer: Self-pay | Admitting: Internal Medicine

## 2022-06-04 ENCOUNTER — Ambulatory Visit (INDEPENDENT_AMBULATORY_CARE_PROVIDER_SITE_OTHER): Payer: Medicare Other | Admitting: Internal Medicine

## 2022-06-04 VITALS — BP 110/80 | HR 64 | Temp 98.4°F | Wt 137.6 lb

## 2022-06-04 DIAGNOSIS — R194 Change in bowel habit: Secondary | ICD-10-CM | POA: Diagnosis not present

## 2022-06-04 DIAGNOSIS — K1379 Other lesions of oral mucosa: Secondary | ICD-10-CM

## 2022-06-04 NOTE — Patient Instructions (Signed)
Get lab at [facility] lab. Referral to GI for evaluation of change in bowel habits and colonoscopy as indicated.  Oit is possible the antibiotic or uti triggered the shower of mouth ulcers   If lab ok  then observation  . And follow

## 2022-06-04 NOTE — Progress Notes (Signed)
Chief Complaint  Patient presents with   bowel movement changes   Mouth Lesions    HPI: Laura Curtis 76 y.o. come in for couple of things Last cologuard  negative was 2019 and dur for rescreening however ever since 2-1/2 years ago when she had to be on antibiotics through 3 months her bowel habits have changed and now gone back to normal they tend to be loose sometimes urgent she has tried probiotics without success.  No blood or other systemic symptoms.  Would like to be referred for colonoscopy and/or other advice.  She also has a history of recurrent mouth ulcers that usually get better more recently had a large 1 on the tip of her tongue that resolved within 5 days and now is getting ones pop up frequently on her gums and lips resolved within a day or 2.  She has recently been treated for UTI by her GYN with Macrobid.  No current fever and UTI symptoms are better urine culture and records shows mixed flora.  She is traveling to the [place] and will be back after September 3.   ROS: See pertinent positives and negatives per HPI.  No systemic symptoms see previous labs done a year or so ago rule out rheumatologic contributing.  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   Hx of varicella    HX: benign breast biopsy    Left rotator cuff tear 01/14/2012   Supraspinatus tear noted on ultrasound. Provided instructions regarding nitroglycerin patches 1/4 patch daily to shoulder.   To continue circular motion exercises and range of motion. No over the head exercises.  No weight  bearing on that side. She can continue to play tennis as tolerated.  FU in 2 weeks if no improvement, 4 weeks if improved.       Shoulder pain, left 12/31/2011   History of rotator cuff tear on the right and now has nontraumatic left shoulder pain    Syncope    June, 2013   Uterine prolapse     Family History  Problem Relation Age of Onset   Stroke Father        age 76   Other Mother        old age died 64    Deep vein thrombosis Mother     Social History   Socioeconomic History   Marital status: Married    Spouse name: Not on file   Number of children: Not on file   Years of education: Not on file   Highest education level: Not on file  Occupational History   Not on file  Tobacco Use   Smoking status: Never   Smokeless tobacco: Never  Vaping Use   Vaping Use: Never used  Substance and Sexual Activity   Alcohol use: Yes    Alcohol/week: 2.0 - 3.0 standard drinks of alcohol    Types: 2 - 3 Standard drinks or equivalent per week  Comment: occ   Drug use: No   Sexual activity: Not Currently    Birth control/protection: None, Surgical    Comment: BTL  Other Topics Concern   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 Sees dentist on a regular basis.    etoh 3 x per week                Social Determinants of Health   Financial Resource Strain: Low Risk  (03/26/2022)   Overall Financial Resource Strain (CARDIA)    Difficulty of Paying Living Expenses: Not hard at all  Food Insecurity: Unknown (03/26/2022)   Hunger Vital Sign    Worried About Running Out of Food in the Last Year: Patient refused    [place] in the Last Year: Patient refused  Transportation Needs: Unknown (03/26/2022)   [place] - Transportation    Lack of Transportation (Medical): Patient refused     Lack of Transportation (Non-Medical): Patient refused  Physical Activity: Sufficiently Active (03/26/2022)   Exercise Vital Sign    Days of Exercise per Week: 6 days    Minutes of Exercise per Session: 60 min  Stress: No Stress Concern Present (03/26/2022)   [place]    Feeling of Stress : Only a little  Social Connections: Unknown (03/26/2022)   Social Connection and Isolation Panel [NHANES]    Frequency of Communication with Friends and Family: More than three times a week    Frequency of Social Gatherings with Friends and Family: More than three times a week    Attends Religious Services: Patient refused    Marine scientist or Organizations: Patient refused    Attends Music therapist: Not on file    Marital Status: Married    Outpatient Medications Prior to Visit  Medication Sig Dispense Refill   estradiol (ESTRACE) 0.1 MG/GM vaginal cream 1 gram vaginally twice weekly 42.5 g 3   meloxicam (MOBIC) 7.5 MG tablet Take 1 tablet (7.5 mg total) by mouth daily. May increase to twice a day  if needed 60 tablet 3   amitriptyline (ELAVIL) 25 MG tablet Take 1 tablet (25 mg total) by mouth at bedtime. 30 tablet 1   gentamicin cream (GARAMYCIN) 0.1 % Apply 1 application topically 2 (two) times daily. 30 g 1   nitrofurantoin, macrocrystal-monohydrate, (MACROBID) 100 MG capsule Take 1 capsule (100 mg total) by mouth 2 (two) times daily. 10 capsule 0   phenazopyridine (PYRIDIUM) 200 MG tablet Take 1 tablet (200 mg total) by mouth 3 (three) times daily as needed for pain. 6 tablet 0   triamcinolone ointment (KENALOG) 0.5 % Apply 1 application topically 2 (two) times daily. 30 g 0   No facility-administered medications prior to visit.     EXAM:  BP 110/80 (BP Location: Left Arm, Patient Position: Sitting, Cuff Size: Normal)   Pulse 64   Temp 98.4 F (36.9 C) (Oral)   Wt 137 lb 9.6 oz (62.4 kg)   LMP  10/28/1995   SpO2 95%   BMI 23.25 kg/m   Body mass index is 23.25 kg/m.  GENERAL: vitals reviewed and listed above, alert, oriented, appears well  hydrated and in no acute distress HEENT: atraumatic, conjunctiva  clear, no obvious abnormalities on inspection of external nose and ears OP : no lesion edema or exudate abscess ulcer small upper lip NECK: no obvious masses on inspection palpation shotty AC nodes MS: moves all extremities without noticeable focal  abnormality PSYCH: pleasant and cooperative, no obvious depression or anxiety Lab Results  Component Value Date   WBC 7.3 12/24/2020   HGB 14.0 12/24/2020   HCT 40.0 12/24/2020   PLT 250.0 12/24/2020   GLUCOSE 94 12/24/2020   CHOL 222 (H) 07/14/2018   TRIG 88.0 07/14/2018   HDL 64.80 07/14/2018   LDLDIRECT 138.7 07/27/2012   LDLCALC 139 (H) 07/14/2018   ALT 18 12/24/2020   AST 15 12/24/2020   NA 140 12/24/2020   K 4.1 12/24/2020   CL 107 12/24/2020   CREATININE 0.81 12/24/2020   BUN 20 12/24/2020   CO2 23 12/24/2020   TSH 4.18 08/31/2018   BP Readings from Last 3 Encounters:  06/04/22 110/80  03/26/22 110/66  03/04/22 (!) 109/53    ASSESSMENT AND PLAN:  Discussed the following assessment and plan:  Change in bowel habits - Plan: Ambulatory referral to Gastroenterology, CBC with Differential/Platelet, Sedimentation rate, C-reactive protein, Comprehensive metabolic panel  Recurrent mouth ulceration - Plan: Ambulatory referral to Gastroenterology, CBC with Differential/Platelet, Sedimentation rate, C-reactive protein, Comprehensive metabolic panel Suspect infection and/or meds could have triggered the more frequent outbreak of her mouth ulcers but rule out hematologic abnormality that I do not suspect. Change in bowel habits related to prolonged antibiotic use number of years ago when she is due for colon cancer screening. Will do referral to GI and they can decide on screening colonoscopy or other testing in  addition.  That would be helpful.  -Patient advised to return or notify health care team  if  new concerns arise.  Patient Instructions  Get lab at [facility] lab. Referral to GI for evaluation of change in bowel habits and colonoscopy as indicated.  Oit is possible the antibiotic or uti triggered the shower of mouth ulcers   If lab ok  then observation  . And follow    Standley Brooking. Charell Faulk M.D.

## 2022-06-05 ENCOUNTER — Other Ambulatory Visit (INDEPENDENT_AMBULATORY_CARE_PROVIDER_SITE_OTHER): Payer: Medicare Other

## 2022-06-05 DIAGNOSIS — K1379 Other lesions of oral mucosa: Secondary | ICD-10-CM | POA: Diagnosis not present

## 2022-06-05 DIAGNOSIS — R194 Change in bowel habit: Secondary | ICD-10-CM | POA: Diagnosis not present

## 2022-06-05 LAB — CBC WITH DIFFERENTIAL/PLATELET
Basophils Absolute: 0 10*3/uL (ref 0.0–0.1)
Basophils Relative: 0.7 % (ref 0.0–3.0)
Eosinophils Absolute: 0.1 10*3/uL (ref 0.0–0.7)
Eosinophils Relative: 2.2 % (ref 0.0–5.0)
HCT: 40.2 % (ref 36.0–46.0)
Hemoglobin: 13.7 g/dL (ref 12.0–15.0)
Lymphocytes Relative: 19.5 % (ref 12.0–46.0)
Lymphs Abs: 1.2 10*3/uL (ref 0.7–4.0)
MCHC: 34.2 g/dL (ref 30.0–36.0)
MCV: 88.3 fl (ref 78.0–100.0)
Monocytes Absolute: 0.4 10*3/uL (ref 0.1–1.0)
Monocytes Relative: 6 % (ref 3.0–12.0)
Neutro Abs: 4.2 10*3/uL (ref 1.4–7.7)
Neutrophils Relative %: 71.6 % (ref 43.0–77.0)
Platelets: 224 10*3/uL (ref 150.0–400.0)
RBC: 4.55 Mil/uL (ref 3.87–5.11)
RDW: 13.4 % (ref 11.5–15.5)
WBC: 5.9 10*3/uL (ref 4.0–10.5)

## 2022-06-05 LAB — COMPREHENSIVE METABOLIC PANEL
ALT: 20 U/L (ref 0–35)
AST: 20 U/L (ref 0–37)
Albumin: 4.1 g/dL (ref 3.5–5.2)
Alkaline Phosphatase: 91 U/L (ref 39–117)
BUN: 26 mg/dL — ABNORMAL HIGH (ref 6–23)
CO2: 24 mEq/L (ref 19–32)
Calcium: 9 mg/dL (ref 8.4–10.5)
Chloride: 106 mEq/L (ref 96–112)
Creatinine, Ser: 0.82 mg/dL (ref 0.40–1.20)
GFR: 68.47 mL/min (ref >60.00)
Glucose, Bld: 92 mg/dL (ref 70–99)
Potassium: 4.2 mEq/L (ref 3.5–5.1)
Sodium: 139 mEq/L (ref 135–145)
Total Bilirubin: 0.4 mg/dL (ref 0.2–1.2)
Total Protein: 6.5 g/dL (ref 6.0–8.3)

## 2022-06-05 LAB — SEDIMENTATION RATE: Sed Rate: 11 mm/hr (ref 0–30)

## 2022-06-05 LAB — C-REACTIVE PROTEIN: CRP: <1 mg/dL (ref 0.5–20.0)

## 2022-06-06 NOTE — Progress Notes (Signed)
Blood count  and chemistry are normal ( bun elevated prob from hydration status) normal inflammatory markers .

## 2022-06-17 ENCOUNTER — Telehealth: Payer: Self-pay | Admitting: Internal Medicine

## 2022-06-17 NOTE — Telephone Encounter (Signed)
Left message for patient to call back and schedule Medicare Annual Wellness Visit (AWV) either virtually or in office. Left  my Laura Curtis number [phone]   Last AWV ;05/08/16  please schedule at anytime with [facility] Nurse Health Advisor 1 or 2   .

## 2022-06-30 ENCOUNTER — Encounter (HOSPITAL_BASED_OUTPATIENT_CLINIC_OR_DEPARTMENT_OTHER): Payer: Self-pay | Admitting: Obstetrics & Gynecology

## 2022-07-02 ENCOUNTER — Encounter (HOSPITAL_BASED_OUTPATIENT_CLINIC_OR_DEPARTMENT_OTHER): Payer: Self-pay | Admitting: Obstetrics & Gynecology

## 2022-07-02 ENCOUNTER — Ambulatory Visit (HOSPITAL_BASED_OUTPATIENT_CLINIC_OR_DEPARTMENT_OTHER): Payer: Medicare Other | Admitting: Obstetrics & Gynecology

## 2022-07-02 VITALS — BP 96/56 | HR 66 | Ht 64.5 in | Wt 138.8 lb

## 2022-07-02 DIAGNOSIS — R102 Pelvic and perineal pain: Secondary | ICD-10-CM | POA: Diagnosis not present

## 2022-07-02 DIAGNOSIS — N8111 Cystocele, midline: Secondary | ICD-10-CM

## 2022-07-02 DIAGNOSIS — Z87440 Personal history of urinary (tract) infections: Secondary | ICD-10-CM | POA: Diagnosis not present

## 2022-07-02 LAB — POCT URINALYSIS DIPSTICK
Bilirubin, UA: NEGATIVE
Glucose, UA: NEGATIVE
Ketones, UA: NEGATIVE
Leukocytes, UA: NEGATIVE
Nitrite, UA: NEGATIVE
Protein, UA: NEGATIVE
Spec Grav, UA: 1.02 (ref 1.010–1.025)
Urobilinogen, UA: 0.2 E.U./dL
pH, UA: 5.5 (ref 5.0–8.0)

## 2022-07-02 NOTE — Patient Instructions (Signed)
[email or web address] [address] [place], [place], [place], [place] [address] [phone]

## 2022-07-03 NOTE — Progress Notes (Signed)
GYNECOLOGY  VISIT  CC:   pelvic pressure   HPI: 76 y.o. G5P0023 [name] female here for complaint of pelvic pressure.  Pt has known cystocele and has been using a pessary.  She does have hx of UTIs but isn't really having dysuria or urgency today.  Does not feel cystocele has worsened but is ready to proceed with additional evaluation.  POCT on urine was not significantly abnormal today.  Pelvic PT and referral to urogynecology discussed.  She is ready to proceed with both of these.  Denies vaginal bleeding.   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   Hx of varicella    HX: benign breast biopsy    Left rotator cuff tear 01/14/2012   Supraspinatus tear noted on ultrasound. Provided instructions regarding nitroglycerin patches 1/4 patch daily to shoulder.   To continue circular motion exercises and range of motion. No over the head exercises.  No weight bearing on that side. She can continue to play tennis as tolerated.  FU in 2 weeks if no improvement, 4 weeks if improved.       Shoulder pain, left 12/31/2011   History of rotator cuff tear on the right and now has nontraumatic left shoulder pain    Syncope    June, 2013   Uterine prolapse     MEDS:   Current Outpatient Medications on File Prior to Visit  Medication Sig Dispense Refill   estradiol (ESTRACE) 0.1 MG/GM vaginal cream 1 gram vaginally twice weekly 42.5 g 3   meloxicam (MOBIC) 7.5 MG tablet Take 1 tablet (7.5 mg total) by mouth daily. May increase to twice a day  if needed 60 tablet 3   No current  facility-administered medications on file prior to visit.    ALLERGIES: Hydrocodone  SH:  married, non smoker  Review of Systems  Constitutional: Negative.   Genitourinary:        Pelvic pressure    PHYSICAL EXAMINATION:    BP (!) 96/56 (BP Location: Left Arm, Patient Position: Sitting, Cuff Size: Large)   Pulse 66   Ht 5' 4.5" (1.638 m) Comment: Reported  Wt 138 lb 12.8 oz (63 kg)   LMP 10/28/1995   BMI 23.46 kg/m     General appearance: alert, cooperative and appears stated age Lymph:  no inguinal LAD noted  Pelvic: External genitalia:  no lesions              Urethra:  normal appearing urethra with no masses, tenderness or lesions              Bartholins and Skenes: normal                 Vagina: normal appearing vagina with normal color and discharge, no lesions, midline cystocele noted              Cervix: no lesions              Bimanual Exam:  Uterus:  normal size, contour, position, consistency, mobility, non-tender              Adnexa: no mass, fullness, tenderness              Assessment/Plan: 1. History of UTI - POCT Urinalysis Dipstick  2. Pelvic pressure in female - will check urine culture and refer to PT - Urine Culture - Ambulatory referral to Physical Therapy  3. Cystocele, midline - Ambulatory referral to Urogynecology - Ambulatory referral to Physical Therapy   .

## 2022-07-07 ENCOUNTER — Encounter (HOSPITAL_BASED_OUTPATIENT_CLINIC_OR_DEPARTMENT_OTHER): Payer: Self-pay | Admitting: Obstetrics & Gynecology

## 2022-07-07 ENCOUNTER — Encounter: Payer: Self-pay | Admitting: Internal Medicine

## 2022-07-07 ENCOUNTER — Ambulatory Visit (INDEPENDENT_AMBULATORY_CARE_PROVIDER_SITE_OTHER): Payer: Medicare Other

## 2022-07-07 VITALS — Ht 64.5 in | Wt 138.0 lb

## 2022-07-07 DIAGNOSIS — Z0000 Encounter for general adult medical examination without abnormal findings: Secondary | ICD-10-CM

## 2022-07-07 NOTE — Progress Notes (Signed)
I connected with Lindon Romp today by telephone and verified that I am speaking with the correct person using two identifiers. Location patient: home Location provider: work Persons participating in the virtual visit: Roxan Yamamoto, Glenna Durand LPN.   I discussed the limitations, risks, security and privacy concerns of performing an evaluation and management service by telephone and the availability of in person appointments. I also discussed with the patient that there may be a patient responsible charge related to this service. The patient expressed understanding and verbally consented to this telephonic visit.    Interactive audio and video telecommunications were attempted between this provider and patient, however failed, due to patient having technical difficulties OR patient did not have access to video capability.  We continued and completed visit with audio only.     Vital signs may be patient reported or missing.  Subjective:   Laura Curtis is a 76 y.o. female who presents for Medicare Annual (Subsequent) preventive examination.  Review of Systems     Cardiac Risk Factors include: advanced age (>89mn, >>73women)     Objective:    Today's Vitals   07/07/22 1456  Weight: 138 lb (62.6 kg)  Height: 5' 4.5" (1.638 m)   Body mass index is 23.32 kg/m.     07/07/2022    2:59 PM 06/05/2016    9:07 AM 04/01/2016    9:08 AM  Advanced Directives  Does Patient Have a Medical Advance Directive? Yes No No  Type of [place] will    Copy of [place] Chart? No - copy requested    Would patient like information on creating a medical advance directive?  No - patient declined information No - patient declined information    Current Medications (verified) Outpatient Encounter Medications as of 07/07/2022  Medication Sig   estradiol (ESTRACE) 0.1 MG/GM vaginal cream 1 gram vaginally twice weekly   meloxicam  (MOBIC) 7.5 MG tablet Take 1 tablet (7.5 mg total) by mouth daily. May increase to twice a day  if needed   No facility-administered encounter medications on file as of 07/07/2022.    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   Hx of varicella    HX: benign breast biopsy    Left rotator cuff tear 01/14/2012   Supraspinatus tear noted on ultrasound. Provided instructions regarding nitroglycerin patches 1/4 patch daily to shoulder.   To continue circular motion exercises and range of motion. No over the head exercises.  No weight bearing on that side. She can continue to play tennis as tolerated.  FU in 2 weeks if no improvement, 4 weeks if improved.       Shoulder pain, left 12/31/2011   History of rotator cuff tear on the right and now has nontraumatic left shoulder pain    Syncope    June, 2013   Uterine prolapse    Past Surgical History:  Procedure Laterality  Date   APPENDECTOMY  1968   BREAST BIOPSY  1998   MOHS SURGERY     for squamous cell ca left nose   OVARIAN CYST SURGERY  1968   REPLACEMENT TOTAL KNEE Right    ROTATOR CUFF REPAIR  2001   TONSILLECTOMY AND ADENOIDECTOMY     TUBAL LIGATION     Family History  Problem Relation Age of Onset   Stroke Father        age 41   Other Mother        old age died 85    Deep vein thrombosis Mother    Social History   Socioeconomic History   Marital status: Married    Spouse name: Not on file   Number of children: Not on file   Years of education: Not on file   Highest education level: Not  on file  Occupational History   Not on file  Tobacco Use   Smoking status: Never   Smokeless tobacco: Never  Vaping Use   Vaping Use: Never used  Substance and Sexual Activity   Alcohol use: Yes    Alcohol/week: 2.0 - 3.0 standard drinks of alcohol    Types: 2 - 3 Standard drinks or equivalent per week    Comment: occ   Drug use: No   Sexual activity: Not Currently    Birth control/protection: None, Surgical    Comment: BTL  Other Topics Concern   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 Sees dentist on a regular basis.    etoh 3 x per week                Social Determinants of Health   Financial Resource Strain: Low Risk  (07/07/2022)   Overall Financial Resource Strain (CARDIA)    Difficulty of Paying Living Expenses: Not hard at all  Food Insecurity: No Food Insecurity (07/07/2022)   Hunger Vital Sign    Worried About Running Out of Food in the Last Year: Never true    Ran Out of Food in the Last Year: Never true  Transportation Needs: No Transportation Needs (07/07/2022)   PRAPARE - Hydrologist (Medical): No    Lack of Transportation (Non-Medical): No  Physical Activity: Sufficiently Active (07/07/2022)   Exercise Vital Sign    Days of Exercise per Week: 7 days    Minutes of Exercise per Session: 90 min  Stress: No Stress Concern Present (07/07/2022)   [place]    Feeling of Stress : Not at all  Social Connections: Unknown (03/26/2022)   Social Connection and Isolation Panel [NHANES]    Frequency of Communication with Friends and Family: More than three times a week    Frequency of Social Gatherings with Friends and Family: More than three times a week    Attends Religious  Services: Patient refused    Marine scientist or Organizations: Patient refused    Attends Music therapist: Not on file    Marital Status: Married    Tobacco Counseling Counseling given: Not Answered   Clinical Intake:  Pre-visit preparation completed: Yes  Pain :  No/denies pain     Nutritional Status: BMI of 19-24  Normal Nutritional Risks: None Diabetes: No  How often do you need to have someone help you when you read instructions, pamphlets, or other written materials from your doctor or pharmacy?: 1 - Never  Diabetic? no  Interpreter Needed?: No  Information entered by :: NAllen LPN   Activities of Daily Living    07/07/2022    3:00 PM 07/03/2022   11:01 AM  In your present state of health, do you have any difficulty performing the following activities:  Hearing? 1 1  Comment has hearing aids   Vision? 0 0  Difficulty concentrating or making decisions? 0 0  Walking or climbing stairs? 0 0  Dressing or bathing? 0 0  Doing errands, shopping? 0 0  Preparing Food and eating ? N N  Using the Toilet? N Y  In the past six months, have you accidently leaked urine? Y Y  Do you have problems with loss of bowel control? N Y  Managing your Medications? N N  Managing your Finances? N N  Housekeeping or managing your Housekeeping? N N    Patient Care Team: Panosh, Standley Brooking, MD as PCP - General (Internal Medicine) Martinique, Amy, MD as Consulting Physician (Dermatology) Megan Salon, MD as Consulting Physician (Gynecology)  Indicate any recent Medical Services you may have received from other than [facility] providers in the past year (date may be approximate).     Assessment:   This is a routine wellness examination for Avalynn.  Hearing/Vision screen Vision Screening - Comments:: Regular eye exams, Marica Otter  Dietary issues and exercise activities discussed: Current Exercise Habits: Home exercise routine, Type of exercise: Other - see comments  (pickle ball), Time (Minutes): > 60, Frequency (Times/Week): 7, Weekly Exercise (Minutes/Week): 0   Goals Addressed             This Visit's Progress    Patient Stated       07/07/2022, stay fit       Depression Screen    07/07/2022    3:00 PM 07/02/2022    9:34 AM 06/04/2022   11:28 AM 03/26/2022    4:06 PM 03/04/2022    2:17 PM 02/11/2021    3:59 PM 06/27/2020    3:03 PM  PHQ 2/9 Scores  PHQ - 2 Score 0 0  0 0 0 0  PHQ- 9 Score    0   0  Exception Documentation   Patient refusal        Fall Risk    07/07/2022    2:59 PM 07/03/2022   11:01 AM 03/26/2022    4:06 PM 03/26/2022   12:13 PM 09/07/2020    2:04 PM  [place] in the past year? 0 0 0 0 0  Number falls in past yr: 0  0  0  Injury with Fall? 0 0 0  0  Risk for fall due to : No Fall Risks  No Fall Risks    Follow up Falls prevention discussed;Education provided;Falls evaluation completed  Falls prevention discussed      FALL RISK PREVENTION PERTAINING TO THE HOME:  Any stairs in or around the home? No  If so, are there any without handrails?  N/a Home free of loose throw rugs in walkways, pet beds, electrical cords, etc? Yes  Adequate lighting in your home to reduce risk of falls? Yes   ASSISTIVE DEVICES UTILIZED TO PREVENT FALLS:  Life alert? No  Use of a cane, walker or w/c? No  Grab bars in the bathroom? No  Shower chair or bench in shower? Yes  Elevated toilet seat or a handicapped toilet? No   TIMED UP AND GO:  Was the test performed? No .       Cognitive Function:        07/07/2022    3:01 PM  6CIT Screen  What Year? 0 points  What month? 0 points  What time? 0 points  Count back from 20 0 points  Months in reverse 0 points  Repeat phrase 0 points  Total Score 0 points    Immunizations Immunization History  Administered Date(s) Administered   Fluad Quad(high Dose 65+) 07/20/2019, 08/13/2021   Influenza Split 07/27/2012   Influenza, High Dose Seasonal PF 08/20/2015, 06/22/2017,  07/14/2018   Influenza,inj,Quad PF,6+ Mos 07/29/2013, 07/24/2014   Influenza-Unspecified 07/01/2020   PFIZER(Purple Top)SARS-COV-2 Vaccination 12/03/2019, 12/27/2019, 08/27/2020   Pneumococcal Conjugate-13 12/21/2015   Pneumococcal Polysaccharide-23 07/27/2012   Td 11/15/2007   Tdap 01/12/2009   Zoster Recombinat (Shingrix) 09/27/2020, 12/17/2020, 03/26/2021    TDAP status: Due, Education has been provided regarding the importance of this vaccine. Advised may receive this vaccine at local pharmacy or Health Dept. Aware to provide a copy of the vaccination record if obtained from local pharmacy or Health Dept. Verbalized acceptance and understanding.  Flu Vaccine status: Due, Education has been provided regarding the importance of this vaccine. Advised may receive this vaccine at local pharmacy or Health Dept. Aware to provide a copy of the vaccination record if obtained from local pharmacy or Health Dept. Verbalized acceptance and understanding.  Pneumococcal vaccine status: Up to date  Covid-19 vaccine status: Completed vaccines  Qualifies for Shingles Vaccine? Yes   Zostavax completed Yes   Shingrix Completed?: Yes  Screening Tests Health Maintenance  Topic Date Due   COVID-19 Vaccine (4 - Pfizer risk series) 09/25/2022 (Originally 10/22/2020)   INFLUENZA VACCINE  01/25/2023 (Originally 05/27/2022)   TETANUS/TDAP  06/05/2023 (Originally 01/13/2019)   Pneumonia Vaccine 71+ Years old  Completed   DEXA SCAN  Completed   Hepatitis C Screening  Completed   Zoster Vaccines- Shingrix  Completed   HPV VACCINES  Aged Out   Fecal DNA (Cologuard)  Discontinued    Health Maintenance  There are no preventive care reminders to display for this patient.  Colorectal cancer screening: No longer required.   Mammogram status: No longer required due to age.  Bone Density status: Completed 02/18/2021  Lung Cancer Screening: (Low Dose CT Chest recommended if Age 61-80 years, 30 pack-year  currently smoking OR have quit w/in 15years.) does not qualify.   Lung Cancer Screening Referral: no  Additional Screening:  Hepatitis C Screening: does qualify; Completed 07/14/2018  Vision Screening: Recommended annual ophthalmology exams for early detection of glaucoma and other disorders of the eye. Is the patient up to date with their annual eye exam?  Yes  Who is the provider or what is the name of the office in which the patient attends annual eye exams? Dr. Sabra Heck If pt is not established with a provider, would they like to be referred to a provider to establish care? No .   Dental Screening: Recommended annual dental exams for proper oral hygiene  Community Resource Referral / Chronic Care Management: CRR required this visit?  No   CCM required this visit?  No      Plan:     I have personally  reviewed and noted the following in the patient's chart:   Medical and social history Use of alcohol, tobacco or illicit drugs  Current medications and supplements including opioid prescriptions. Patient is not currently taking opioid prescriptions. Functional ability and status Nutritional status Physical activity Advanced directives List of other physicians Hospitalizations, surgeries, and ER visits in previous 12 months Vitals Screenings to include cognitive, depression, and falls Referrals and appointments  In addition, I have reviewed and discussed with patient certain preventive protocols, quality metrics, and best practice recommendations. A written personalized care plan for preventive services as well as general preventive health recommendations were provided to patient.     Kellie Simmering, LPN   [id number]   Nurse Notes: none  Due to this being a virtual visit, the after visit summary with patients personalized plan was offered to patient via mail or my-chart. Patient would like to access on my-chart

## 2022-07-07 NOTE — Patient Instructions (Signed)
Laura Curtis , Thank you for taking time to come for your Medicare Wellness Visit. I appreciate your ongoing commitment to your health goals. Please review the following plan we discussed and let me know if I can assist you in the future.   Screening recommendations/referrals: Colonoscopy: patient requests colonoscopy Mammogram: not required Bone Density: completed 02/18/2021 Recommended yearly ophthalmology/optometry visit for glaucoma screening and checkup Recommended yearly dental visit for hygiene and checkup  Vaccinations: Influenza vaccine: due Pneumococcal vaccine: completed 12/21/2015 Tdap vaccine: due Shingles vaccine: completed   Covid-19: 08/27/2020, 12/27/2019, 12/03/2019  Advanced directives: Please bring a copy of your POA (Power of Attorney) and/or Living Will to your next appointment.   Conditions/risks identified: none  Next appointment: Follow up in one year for your annual wellness visit    Preventive Care 65 Years and Older, Female Preventive care refers to lifestyle choices and visits with your health care provider that can promote health and wellness. What does preventive care include? A yearly physical exam. This is also called an annual well check. Dental exams once or twice a year. Routine eye exams. Ask your health care provider how often you should have your eyes checked. Personal lifestyle choices, including: Daily care of your teeth and gums. Regular physical activity. Eating a healthy diet. Avoiding tobacco and drug use. Limiting alcohol use. Practicing safe sex. Taking low-dose aspirin every day. Taking vitamin and mineral supplements as recommended by your health care provider. What happens during an annual well check? The services and screenings done by your health care provider during your annual well check will depend on your age, overall health, lifestyle risk factors, and family history of disease. Counseling  Your health care provider may ask  you questions about your: Alcohol use. Tobacco use. Drug use. Emotional well-being. Home and relationship well-being. Sexual activity. Eating habits. History of falls. Memory and ability to understand (cognition). Work and work Statistician. Reproductive health. Screening  You may have the following tests or measurements: Height, weight, and BMI. Blood pressure. Lipid and cholesterol levels. These may be checked every 5 years, or more frequently if you are over 19 years old. Skin check. Lung cancer screening. You may have this screening every year starting at age 39 if you have a 30-pack-year history of smoking and currently smoke or have quit within the past 15 years. Fecal occult blood test (FOBT) of the stool. You may have this test every year starting at age 78. Flexible sigmoidoscopy or colonoscopy. You may have a sigmoidoscopy every 5 years or a colonoscopy every 10 years starting at age 78. Hepatitis C blood test. Hepatitis B blood test. Sexually transmitted disease (STD) testing. Diabetes screening. This is done by checking your blood sugar (glucose) after you have not eaten for a while (fasting). You may have this done every 1-3 years. Bone density scan. This is done to screen for osteoporosis. You may have this done starting at age 63. Mammogram. This may be done every 1-2 years. Talk to your health care provider about how often you should have regular mammograms. Talk with your health care provider about your test results, treatment options, and if necessary, the need for more tests. Vaccines  Your health care provider may recommend certain vaccines, such as: Influenza vaccine. This is recommended every year. Tetanus, diphtheria, and acellular pertussis (Tdap, Td) vaccine. You may need a Td booster every 10 years. Zoster vaccine. You may need this after age 60. Pneumococcal 13-valent conjugate (PCV13) vaccine. One dose is recommended after  age 20. Pneumococcal  polysaccharide (PPSV23) vaccine. One dose is recommended after age 7. Talk to your health care provider about which screenings and vaccines you need and how often you need them. This information is not intended to replace advice given to you by your health care provider. Make sure you discuss any questions you have with your health care provider. Document Released: 11/09/2015 Document Revised: 07/02/2016 Document Reviewed: 08/14/2015 Elsevier Interactive Patient Education  2017 [employer] Prevention in the Home Falls can cause injuries. They can happen to people of all ages. There are many things you can do to make your home safe and to help prevent falls. What can I do on the outside of my home? Regularly fix the edges of walkways and driveways and fix any cracks. Remove anything that might make you trip as you walk through a door, such as a raised step or threshold. Trim any bushes or trees on the path to your home. Use bright outdoor lighting. Clear any walking paths of anything that might make someone trip, such as rocks or tools. Regularly check to see if handrails are loose or broken. Make sure that both sides of any steps have handrails. Any raised decks and porches should have guardrails on the edges. Have any leaves, snow, or ice cleared regularly. Use sand or salt on walking paths during winter. Clean up any spills in your garage right away. This includes oil or grease spills. What can I do in the bathroom? Use night lights. Install grab bars by the toilet and in the tub and shower. Do not use towel bars as grab bars. Use non-skid mats or decals in the tub or shower. If you need to sit down in the shower, use a plastic, non-slip stool. Keep the floor dry. Clean up any water that spills on the floor as soon as it happens. Remove soap buildup in the tub or shower regularly. Attach bath mats securely with double-sided non-slip rug tape. Do not have throw rugs and other  things on the floor that can make you trip. What can I do in the bedroom? Use night lights. Make sure that you have a light by your bed that is easy to reach. Do not use any sheets or blankets that are too big for your bed. They should not hang down onto the floor. Have a firm chair that has side arms. You can use this for support while you get dressed. Do not have throw rugs and other things on the floor that can make you trip. What can I do in the kitchen? Clean up any spills right away. Avoid walking on wet floors. Keep items that you use a lot in easy-to-reach places. If you need to reach something above you, use a strong step stool that has a grab bar. Keep electrical cords out of the way. Do not use floor polish or wax that makes floors slippery. If you must use wax, use non-skid floor wax. Do not have throw rugs and other things on the floor that can make you trip. What can I do with my stairs? Do not leave any items on the stairs. Make sure that there are handrails on both sides of the stairs and use them. Fix handrails that are broken or loose. Make sure that handrails are as long as the stairways. Check any carpeting to make sure that it is firmly attached to the stairs. Fix any carpet that is loose or worn. Avoid having throw rugs  at the top or bottom of the stairs. If you do have throw rugs, attach them to the floor with carpet tape. Make sure that you have a light switch at the top of the stairs and the bottom of the stairs. If you do not have them, ask someone to add them for you. What else can I do to help prevent falls? Wear shoes that: Do not have high heels. Have rubber bottoms. Are comfortable and fit you well. Are closed at the toe. Do not wear sandals. If you use a stepladder: Make sure that it is fully opened. Do not climb a closed stepladder. Make sure that both sides of the stepladder are locked into place. Ask someone to hold it for you, if possible. Clearly  mark and make sure that you can see: Any grab bars or handrails. First and last steps. Where the edge of each step is. Use tools that help you move around (mobility aids) if they are needed. These include: Canes. Walkers. Scooters. Crutches. Turn on the lights when you go into a dark area. Replace any light bulbs as soon as they burn out. Set up your furniture so you have a clear path. Avoid moving your furniture around. If any of your floors are uneven, fix them. If there are any pets around you, be aware of where they are. Review your medicines with your doctor. Some medicines can make you feel dizzy. This can increase your chance of falling. Ask your doctor what other things that you can do to help prevent falls. This information is not intended to replace advice given to you by your health care provider. Make sure you discuss any questions you have with your health care provider. Document Released: 08/09/2009 Document Revised: 03/20/2016 Document Reviewed: 11/17/2014 Elsevier Interactive Patient Education  2017 [employer].

## 2022-07-08 ENCOUNTER — Telehealth (HOSPITAL_BASED_OUTPATIENT_CLINIC_OR_DEPARTMENT_OTHER): Payer: Self-pay | Admitting: *Deleted

## 2022-07-08 NOTE — Telephone Encounter (Signed)
-----   Message from Megan Salon, MD sent at 07/07/2022  9:53 PM EDT ----- Regarding: urine culture from last week Please call pt.  She was seen last Wednesday.  Urine culture was ordered.  Does not look like it was actually sent.  Please let her know I do not have any results for this and recommend her returning to give repeat sample to ensure this is negative.  Does have PT appt scheduled on 9/13 so could come by on Wednesday and leave sample if that works for her as well.  Thank you.  MSM

## 2022-07-08 NOTE — Telephone Encounter (Signed)
Called pt to advised that urine culture that was ordered was not sent. Pt will come by office after her PT appt tomorrow on 9/13 to provide [place] with additional urine sample.

## 2022-07-09 ENCOUNTER — Other Ambulatory Visit: Payer: Self-pay

## 2022-07-09 ENCOUNTER — Ambulatory Visit: Payer: Medicare Other | Attending: Obstetrics & Gynecology | Admitting: Physical Therapy

## 2022-07-09 DIAGNOSIS — R293 Abnormal posture: Secondary | ICD-10-CM | POA: Diagnosis not present

## 2022-07-09 DIAGNOSIS — R279 Unspecified lack of coordination: Secondary | ICD-10-CM | POA: Diagnosis not present

## 2022-07-09 DIAGNOSIS — N8111 Cystocele, midline: Secondary | ICD-10-CM | POA: Diagnosis not present

## 2022-07-09 DIAGNOSIS — R102 Pelvic and perineal pain: Secondary | ICD-10-CM | POA: Insufficient documentation

## 2022-07-09 DIAGNOSIS — M6281 Muscle weakness (generalized): Secondary | ICD-10-CM

## 2022-07-09 NOTE — Therapy (Addendum)
OUTPATIENT PHYSICAL THERAPY FEMALE PELVIC EVALUATION   Patient Name: Laura Curtis MRN: [id number] DOB:[birth year], 76 y.o., female Today's Date: 07/09/2022   PT End of Session - 07/09/22 1549     Visit Number 1    Date for PT Re-Evaluation 10/08/22    Authorization Type [employer] medicare    Progress Note Due on Visit 10    PT Start Time 1525    PT Stop Time 1600    PT Time Calculation (min) 35 min    Activity Tolerance Patient tolerated treatment well    Behavior During Therapy [facility] for tasks assessed/performed             Past Medical History:  Diagnosis Date   Biceps tendinopathy 01/14/2012   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   Hx of varicella    HX: benign breast biopsy    Left rotator cuff tear 01/14/2012   Supraspinatus tear noted on ultrasound. Provided instructions regarding nitroglycerin patches 1/4 patch daily to shoulder.   To continue circular motion exercises and range of motion. No over the head exercises.  No weight bearing on that side. She can continue to play tennis as tolerated.  FU in 2 weeks if no improvement, 4 weeks if improved.       Shoulder pain, left 12/31/2011   History of rotator cuff tear on the right and now has nontraumatic left shoulder pain    Syncope    June, 2013   Uterine prolapse    Past Surgical History:  Procedure Laterality Date   [place]     for squamous cell ca left nose   OVARIAN CYST SURGERY  1968   REPLACEMENT TOTAL KNEE Right    ROTATOR CUFF REPAIR  2001   TONSILLECTOMY AND ADENOIDECTOMY     TUBAL LIGATION     Patient Active Problem List   Diagnosis  Date Noted   Primary osteoarthritis of both first carpometacarpal joints 10/04/2021   Radial nerve compression, right 10/04/2021   Cervical spine arthritis 10/04/2021   Arthritis of midfoot, left 07/23/2021   S/P TKR (total knee replacement) using cement, right 12/07/2019   Vaginal atrophy 10/06/2019   Incomplete uterine prolapse 10/06/2019   Hearing aid worn    Hx of syncope 07/21/2012   Counseling on health promotion and disease prevention 07/21/2012   Hx of nonmelanoma skin cancer 07/21/2012   History of positive PPD    HX: benign breast biopsy     PCP: Burnis Medin, MD  REFERRING PROVIDER: Megan Salon, MD  REFERRING DIAG: R10.2 (ICD-10-CM) - Pelvic pressure in female N81.11 (ICD-10-CM) - Cystocele, midline  THERAPY DIAG:  Muscle weakness (generalized)  Abnormal posture  Unspecified lack of coordination  Rationale for Evaluation and Treatment Rehabilitation  ONSET DATE: years ago  SUBJECTIVE:  SUBJECTIVE STATEMENT: Pt reports she constantly feels like she can't fully empty urine or bowel, has bowel urgency and notes sometimes she may have a small drop of urine in underwear but not with anything specific. Does wear a pessary due to cystocele 15 years now.  Fluid intake: Yes: 72 oz, tea daily, 3oz wine 3-4x per week     PAIN:  Are you having pain? No  PRECAUTIONS: None  WEIGHT BEARING RESTRICTIONS No  FALLS:  Has patient fallen in last 6 months? No  LIVING ENVIRONMENT: Lives with: lives with their spouse   OCCUPATION: retired   PLOF: Independent  PATIENT GOALS to have improved voids  PERTINENT HISTORY:  Hepatitis A, varicella, Uterine prolapse, x3 vaginal births Sexual abuse: No  BOWEL MOVEMENT Pain with bowel movement: No Type of bowel movement:Type ([place]  Stool Scale) 2-7 and Frequency daily, when harder needs to strain Fully empty rectum: No Leakage: No Pads: No Fiber supplement: No  URINATION Pain with urination: No Fully empty bladder: Yes:   Stream: Weak Urgency: Yes:   Frequency: not quicker than every 2 hours, 1x per night Leakage:  has maybe a drop of urine leakage intermittently with sneezing Pads: No  INTERCOURSE Pain with intercourse:  not active Not painful   PREGNANCY Vaginal deliveries 3 Tearing Yes: with the first but doesn't think with other C-section deliveries 0 Currently pregnant No  PROLAPSE Cystocele pressure/heaviness even with pessary in place    OBJECTIVE:   DIAGNOSTIC FINDINGS:    COGNITION:  Overall cognitive status: Within functional limits for tasks assessed     SENSATION:  Light touch: Appears intact  Proprioception: Appears intact  MUSCLE LENGTH: Bil hamstrings and adductors limited by 25%                POSTURE: rounded shoulders, forward head, and posterior pelvic tilt    LUMBARAROM/PROM  A/PROM A/PROM  eval  Flexion WFL  Extension WFL  Right lateral flexion WFL  Left lateral flexion WFL  Right rotation Limited by 25%  Left rotation Limited by 25%   (Blank rows = not tested)  LOWER EXTREMITY ROM:  WFL  LOWER EXTREMITY MMT:  Bil hip abduction 3+/5 all other hips 4/5; knees and ankles 5/5   PALPATION:   General  no TTP, fascial restrictions in middle and lower abdominal quadrants Rt and Lt                External Perineal Exam no TTP, [facility]                             Internal Pelvic Floor WFL, pessary in place however  Patient confirms identification and approves PT to assess internal pelvic floor and treatment Yes  PELVIC MMT:   MMT eval  Vaginal 1/5 with pessary in place, pt educated that next time if pt able and agreeable it may be beneficial to attempt internal without pessary in place to better assess strength and prolapse. Pt agreed and reports she will  plan this for a day she has it removed.   Internal Anal Sphincter   External Anal Sphincter   Puborectalis   Diastasis Recti   (Blank rows = not tested)        TONE: Decreased   PROLAPSE: Unable to fully assess as pt has pessary in place  TODAY'S TREATMENT  EVAL Examination completed, findings reviewed, pt educated on POC, HEP, and pt directed in correct technique of  pelvic floor contraction and breathing mechanics, x6 reps to fully contract at 1/5 strength, initially noting compensatory strategies with valsalva or over use of gluteals. Pt also directed in reps in sitting with over use of glutes continuing but breathing mechanics correct, towel roll used to improve contraction feedback with mild improvement but without gluteals overusing.  Pt motivated to participate in PT and agreeable to attempt recommendations.     PATIENT EDUCATION:  Education details: [id number] Person educated: Patient Education method: Explanation, Demonstration, Tactile cues, Verbal cues, and Handouts Education comprehension: verbalized understanding and returned demonstration   HOME EXERCISE PROGRAM: [id number]  ASSESSMENT:  CLINICAL IMPRESSION: Patient is a 76 y.o. female  who was seen today for physical therapy evaluation and treatment for prolapse, urine and bowel urgency, decreased ability to fully void urine and stool. Pt also states she may have a drip of urine leakage with sneezing. Pt found to have decreased flexibility mildly at bil hips and spine, mild hip weakness and core weakness noted. Pt consented to internal vaginal assessment this date and found to have decreased strength, coordination, and endurance and benefited from extra time and NRME training for improved coordination and muscle activation of pelvic floor muscles and pt's awareness of muscle mobility. Pt would benefit from additional PT to further address deficits.     OBJECTIVE IMPAIRMENTS decreased coordination, decreased endurance,  decreased strength, increased fascial restrictions, increased muscle spasms, impaired flexibility, improper body mechanics, and postural dysfunction.   ACTIVITY LIMITATIONS carrying, lifting, squatting, and continence  PARTICIPATION LIMITATIONS: shopping, community activity, occupation, and yard work  PERSONAL FACTORS Time since onset of injury/illness/exacerbation and 1 comorbidity: x3 vaginal births, post menopausal  are also affecting patient's functional outcome.   REHAB POTENTIAL: Good  CLINICAL DECISION MAKING: Stable/uncomplicated  EVALUATION COMPLEXITY: Low   GOALS: Goals reviewed with patient? Yes  SHORT TERM GOALS: Target date: 08/06/2022  Pt to be I with HEP.  Baseline: Goal status: INITIAL  2.  Pt will have 25% less urgency of bowel and bladder due to bladder retraining and strengthening  Baseline:  Goal status: INITIAL  3.  Pt to demonstrate at least 2/5 pelvic floor strength for improved pelvic stability and decreased strain at pelvic floor/ decrease leakage.  Baseline:  Goal status: INITIAL  4.  Pt to be I with voiding and breathing mechanics for improved voiding of bowel and bladder.  Baseline:  Goal status: INITIAL  5.  Pt to demonstrate improved coordination of pelvic floor and breathing mechanics during activity to decrease strain at pelvic floor and prolapse with body weight squats without compensatory strategies.  Baseline:  Goal status: INITIAL   LONG TERM GOALS: Target date:  10/08/22    Pt to be I with advanced HEP.  Baseline:  Goal status: INITIAL  2.  Pt will have 50% less urgency due to bladder retraining and strengthening  Baseline:  Goal status: INITIAL  3.  Pt to demonstrate at least 3/5 pelvic floor strength for improved pelvic stability and decreased strain at pelvic floor/ decrease leakage.  Baseline:  Goal status: INITIAL  4.   Pt to demonstrate improved coordination of pelvic floor and breathing mechanics during activity to  decrease strain at pelvic floor and prolapse with  squatting 15# without compensatory strategies or pressure felt at prolapse.  Baseline:  Goal status: INITIAL   PLAN: PT FREQUENCY: 1x/week  PT DURATION:  8 sessions  PLANNED INTERVENTIONS: Therapeutic exercises, Therapeutic activity, Neuromuscular re-education, Patient/Family education, Self Care, Joint mobilization, Dry Needling, Spinal  mobilization, Cryotherapy, Moist heat, scar mobilization, Taping, Biofeedback, and Manual therapy  PLAN FOR NEXT SESSION: core and hip strengthening, coordination of pelvic floor with breathing mechanics with exercises, voiding mechanics, breathing mechanics, urge drill    Stacy Gardner, PT, DPT 09/13/234:20 PM   PHYSICAL THERAPY DISCHARGE SUMMARY  Visits from Start of Care: 1  Current functional level related to goals / functional outcomes: Pt reports she is doing better and requests DC   Remaining deficits: Pt denied additional needs at this time, will return with new referral If needed.    Education / Equipment: HEP   Patient agrees to discharge. Patient goals were partially met. Patient is being discharged due to being pleased with the current functional level.  Stacy Gardner, PT, DPT 10/31/233:52 PM

## 2022-07-10 ENCOUNTER — Ambulatory Visit (HOSPITAL_BASED_OUTPATIENT_CLINIC_OR_DEPARTMENT_OTHER): Payer: Medicare Other

## 2022-07-10 ENCOUNTER — Encounter (HOSPITAL_BASED_OUTPATIENT_CLINIC_OR_DEPARTMENT_OTHER): Payer: Self-pay

## 2022-07-25 ENCOUNTER — Ambulatory Visit: Payer: Medicare Other

## 2022-07-31 ENCOUNTER — Ambulatory Visit: Payer: Medicare Other | Admitting: Physical Therapy

## 2022-08-05 ENCOUNTER — Ambulatory Visit (INDEPENDENT_AMBULATORY_CARE_PROVIDER_SITE_OTHER): Payer: Medicare Other

## 2022-08-05 DIAGNOSIS — Z23 Encounter for immunization: Secondary | ICD-10-CM

## 2022-08-06 DIAGNOSIS — H52223 Regular astigmatism, bilateral: Secondary | ICD-10-CM | POA: Diagnosis not present

## 2022-08-06 DIAGNOSIS — H53143 Visual discomfort, bilateral: Secondary | ICD-10-CM | POA: Diagnosis not present

## 2022-08-06 DIAGNOSIS — H524 Presbyopia: Secondary | ICD-10-CM | POA: Diagnosis not present

## 2022-08-06 DIAGNOSIS — H5212 Myopia, left eye: Secondary | ICD-10-CM | POA: Diagnosis not present

## 2022-08-13 ENCOUNTER — Encounter: Payer: Medicare Other | Admitting: Physical Therapy

## 2022-08-18 ENCOUNTER — Ambulatory Visit: Payer: Medicare Other | Admitting: Internal Medicine

## 2022-08-18 ENCOUNTER — Encounter: Payer: Self-pay | Admitting: Internal Medicine

## 2022-08-18 VITALS — BP 118/72 | HR 68 | Ht 64.0 in | Wt 142.0 lb

## 2022-08-18 DIAGNOSIS — Z1211 Encounter for screening for malignant neoplasm of colon: Secondary | ICD-10-CM | POA: Diagnosis not present

## 2022-08-18 DIAGNOSIS — R194 Change in bowel habit: Secondary | ICD-10-CM

## 2022-08-18 MED ORDER — NA SULFATE-K SULFATE-MG SULF 17.5-3.13-1.6 GM/177ML PO SOLN: 1.0000 | Freq: Once | ORAL | 0 refills | Status: AC

## 2022-08-18 NOTE — Progress Notes (Signed)
HISTORY OF PRESENT ILLNESS:  Laura Curtis is a 76 y.o. female, native of [place], history for today by her primary care provider regarding change in bowel habits and colonoscopy.  Patient has not had prior colonoscopy.  She has undergone Cologuard testing, most recently 2019, which has been negative.  She tells me after being on antibiotics for about 3 months (for an oral infection) she had abrupt change in her bowel habits.  Previously they were daily and predictable.  Thereafter she describes urgency with loose stools and insecurity.  She tried probiotic without benefit.  She has had recurrent UTIs which required treatment about once every 9 to 12 months.  Interestingly, she reports visiting [place] at the latter part of August 2023 and developed food poisoning.  She was quite ill.  Thereafter she reports that her bowel habit problems have resolved.  She denies bleeding.  No family history of colon cancer.  She is in excellent shape.  Review of blood work from June 05, 2022 shows unremarkable comprehensive metabolic panel.  Normal liver test.  Normal CBC with hemoglobin 13.7.  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   Hx of varicella    HX: benign breast biopsy    Left rotator cuff tear 01/14/2012   Supraspinatus tear noted on ultrasound. Provided instructions regarding nitroglycerin patches 1/4 patch daily to shoulder.   To continue circular motion  exercises and range of motion. No over the head exercises.  No weight bearing on that side. She can continue to play tennis as tolerated.  FU in 2 weeks if no improvement, 4 weeks if improved.       Shoulder pain, left 12/31/2011   History of rotator cuff tear on the right and now has nontraumatic left shoulder pain    Syncope    June, 2013   Uterine prolapse     Past Surgical History:  Procedure Laterality Date   [place]     for squamous cell ca left nose   OVARIAN CYST SURGERY  1968   REPLACEMENT TOTAL KNEE Right    ROTATOR CUFF REPAIR  2001   TONSILLECTOMY AND ADENOIDECTOMY     TUBAL LIGATION      Social History Laura Curtis  reports that she has never smoked. She has never used smokeless tobacco. She reports current alcohol use of about 2.0 - 3.0 standard drinks of alcohol per week. She reports that she does not use drugs.  family history includes Deep vein thrombosis in her mother; Other in her mother; Stroke in her father.  Allergies  Allergen Reactions   Hydrocodone Other (See Comments), Nausea Only and Nausea And Vomiting       PHYSICAL EXAMINATION: Vital signs: BP 118/72   Pulse 68   Ht 5'  4" (1.626 m)   Wt 142 lb (64.4 kg)   LMP 10/28/1995   BMI 24.37 kg/m   Constitutional: generally well-appearing, no acute distress.  Hearing aid Psychiatric: alert and oriented x3, cooperative Eyes: extraocular movements intact, anicteric, conjunctiva pink Mouth: oral pharynx moist, no lesions Neck: supple no lymphadenopathy Cardiovascular: heart regular rate and rhythm, no murmur Lungs: clear to auscultation bilaterally Abdomen: soft, nontender, nondistended, no obvious ascites, no peritoneal signs, normal bowel sounds, no organomegaly Rectal: Deferred until colonoscopy Extremities: no lower extremity edema bilaterally Skin: no lesions on visible extremities Neuro: No focal deficits.  Cranial nerves  intact  ASSESSMENT:  1.  Change in bowel habits as described.  Seemingly related to antibiotic exposure.  Interestingly, improved recently after "food poisoning". 2.  Colon cancer screening.  Last strategy was negative Cologuard testing 4 years ago.  She is motivated.  No contraindication   PLAN:  1.  Colonoscopy with biopsies.  Rule out microscopic colitis.  Provide colon cancer screening.  The nature of the procedure, as well as the risks, benefits, and alternatives were carefully and thoroughly reviewed with the patient. Ample time for discussion and questions allowed. The patient understood, was satisfied, and agreed to proceed.  A total of 45 minutes was spent preparing to see the patient, reviewing outside data and reports, obtaining comprehensive history, performing medically appropriate physical examination, counseling and educating the patient regarding the above listed issues, scheduling colonoscopy, and documenting clinical information in the health record

## 2022-08-18 NOTE — Patient Instructions (Signed)
_______________________________________________________  If you are age 78 or older, your body mass index should be between 23-30. Your Body mass index is 24.37 kg/m. If this is out of the aforementioned range listed, please consider follow up with your Primary Care Provider.  If you are age 72 or younger, your body mass index should be between 19-25. Your Body mass index is 24.37 kg/m. If this is out of the aformentioned range listed, please consider follow up with your Primary Care Provider.   ________________________________________________________  The [facility] providers would like to encourage you to use [facility] to communicate with providers for non-urgent requests or questions.  Due to long hold times on the telephone, sending your provider a message by [facility] may be a faster and more efficient way to get a response.  Please allow 48 business hours for a response.  Please remember that this is for non-urgent requests.  _______________________________________________________  Dennis Bast have been scheduled for a colonoscopy. Please follow written instructions given to you at your visit today.  Please pick up your prep supplies at the pharmacy within the next 1-3 days. If you use inhalers (even only as needed), please bring them with you on the day of your procedure.

## 2022-08-21 ENCOUNTER — Encounter: Payer: Medicare Other | Admitting: Physical Therapy

## 2022-08-28 ENCOUNTER — Encounter: Payer: Medicare Other | Admitting: Physical Therapy

## 2022-09-04 ENCOUNTER — Encounter: Payer: Medicare Other | Admitting: Physical Therapy

## 2022-09-11 ENCOUNTER — Encounter: Payer: Medicare Other | Admitting: Physical Therapy

## 2022-09-22 ENCOUNTER — Encounter: Payer: Medicare Other | Admitting: Internal Medicine

## 2022-10-26 DIAGNOSIS — R69 Illness, unspecified: Secondary | ICD-10-CM | POA: Diagnosis not present

## 2022-10-30 ENCOUNTER — Telehealth: Payer: Self-pay | Admitting: Internal Medicine

## 2022-10-30 ENCOUNTER — Other Ambulatory Visit: Payer: Self-pay

## 2022-10-30 DIAGNOSIS — M180 Bilateral primary osteoarthritis of first carpometacarpal joints: Secondary | ICD-10-CM

## 2022-10-30 MED ORDER — MELOXICAM 7.5 MG PO TABS: 7.5000 mg | ORAL_TABLET | Freq: Every day | ORAL | 3 refills | Status: DC

## 2022-10-30 NOTE — Telephone Encounter (Signed)
meloxicam (MOBIC) 7.5 MG tablet   [place], [place] Phone: [phone]  Fax: [phone]

## 2022-10-30 NOTE — Telephone Encounter (Signed)
Refills sent to [facility]

## 2022-11-06 DIAGNOSIS — R69 Illness, unspecified: Secondary | ICD-10-CM | POA: Diagnosis not present

## 2022-11-09 DIAGNOSIS — R69 Illness, unspecified: Secondary | ICD-10-CM | POA: Diagnosis not present

## 2022-11-13 DIAGNOSIS — R69 Illness, unspecified: Secondary | ICD-10-CM | POA: Diagnosis not present

## 2022-11-18 DIAGNOSIS — R69 Illness, unspecified: Secondary | ICD-10-CM | POA: Diagnosis not present

## 2022-11-25 ENCOUNTER — Encounter: Payer: Self-pay | Admitting: Obstetrics and Gynecology

## 2022-11-25 ENCOUNTER — Ambulatory Visit: Payer: Medicare HMO | Admitting: Obstetrics and Gynecology

## 2022-11-25 VITALS — BP 119/72 | HR 66 | Ht 63.5 in | Wt 144.0 lb

## 2022-11-25 DIAGNOSIS — N816 Rectocele: Secondary | ICD-10-CM | POA: Diagnosis not present

## 2022-11-25 DIAGNOSIS — R350 Frequency of micturition: Secondary | ICD-10-CM | POA: Diagnosis not present

## 2022-11-25 DIAGNOSIS — N3281 Overactive bladder: Secondary | ICD-10-CM

## 2022-11-25 DIAGNOSIS — N8110 Cystocele, unspecified: Secondary | ICD-10-CM | POA: Diagnosis not present

## 2022-11-25 DIAGNOSIS — R159 Full incontinence of feces: Secondary | ICD-10-CM | POA: Diagnosis not present

## 2022-11-25 LAB — POCT URINALYSIS DIPSTICK
Bilirubin, UA: NEGATIVE
Blood, UA: NEGATIVE
Glucose, UA: NEGATIVE
Ketones, UA: NEGATIVE
Leukocytes, UA: NEGATIVE
Nitrite, UA: NEGATIVE
Protein, UA: NEGATIVE
Spec Grav, UA: <=1.005 — AB (ref 1.010–1.025)
Urobilinogen, UA: 0.2 E.U./dL
pH, UA: 5.5 (ref 5.0–8.0)

## 2022-11-25 NOTE — Progress Notes (Signed)
[facility] New Patient Evaluation and Consultation  Referring Provider: Megan Salon, MD PCP: Burnis Medin, MD Date of Service: 11/25/2022  SUBJECTIVE Chief Complaint: New Patient (Initial Visit) Laura Curtis is a 77 y.o. female here for a consult for urinary and bowel incontinence. Pt said she has prolapse and uses a pessary /)  History of Present Illness: Laura Curtis is a 77 y.o. White or Caucasian female seen in consultation at the request of Dr. Sabra Heck for evaluation of prolapse.    Review of records significant for: Has been using a pessary for her cystocele. She has attended pelvic PT.   Urinary Symptoms: Leaks urine with cough/ sneeze, going from sitting to standing, and without sensation.  Has occurred over the last 18 months. UUI is more significant.  Leaks occasionally, a few drips. Maybe once a day Pad use: none She is bothered by her UI symptoms. She went for one PT appointment  Day time voids 4-5.  Nocturia: 1 times per night to void. Voiding dysfunction: she does not empty her bladder well.  does not use a catheter to empty bladder.  When urinating, she feels a weak stream, dribbling after finishing, and to push on her belly or vagina to empty bladder Drinks: 2 cups tea (english breakfast), 4-6 12oz glasses water per day  UTIs: 3 UTI's in the last year.   Denies history of blood in urine and kidney or bladder stones  Pelvic Organ Prolapse Symptoms:                  She Denies a feeling of a bulge the vaginal area. Currently uses a ring pessary.  She is fine using the pessary and is not bothered by her prolapse Leaves pessary in for a week at a time.   Bowel Symptom: Bowel movements: 1-3 time(s) per day Stool consistency: loose Straining: no.  Splinting: no.  Incomplete evacuation: no.   Admits to accidental bowel leakage / fecal incontinence. Started a few years ago after she was on antibiotics.   Occurs: every day, has fecal  urgency  Consistency with leakage: soft   Vegetables and tomatoes make symptoms worse.  Bowel regimen: none Last colonoscopy: never- seen by GI in October and recommended colonoscopy  Sexual Function Sexually active: no.    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   Hx of varicella    HX: benign breast biopsy    Left rotator cuff tear 01/14/2012   Supraspinatus tear noted on ultrasound. Provided instructions regarding nitroglycerin patches 1/4 patch daily to shoulder.   To continue circular motion exercises and range of motion. No over the head exercises.  No weight bearing on that side. She can continue to play tennis as tolerated.  FU in 2 weeks if no improvement, 4 weeks if improved.       Shoulder pain, left 12/31/2011   History  of rotator cuff tear on the right and now has nontraumatic left shoulder pain    Syncope    June, 2013   Uterine prolapse      Past Surgical History:   Past Surgical History:  Procedure Laterality Date   [place]     for squamous cell ca left nose   OVARIAN CYST SURGERY  1968   REPLACEMENT TOTAL KNEE Right    ROTATOR CUFF REPAIR  2001   TONSILLECTOMY AND ADENOIDECTOMY     TUBAL LIGATION       Past OB/GYN History: OB History  Gravida Para Term Preterm AB Living  '5 3     2 3  '$ SAB IAB Ectopic Multiple Live Births    2     3    # Outcome Date GA Lbr Len/2nd Weight Sex Delivery Anes PTL Lv  5 IAB           4 IAB           3 Para      F Vag-Spont   LIV  2 Para     F Vag-Spont   LIV  1 Para     F Vag-Spont   LIV   Menopausal: Denies vaginal bleeding since menopause Last pap smear was 2019- negative.     Medications: She has a current medication list which includes the following prescription(s): estradiol and meloxicam.   Allergies: Patient is allergic to hydrocodone.   Social History:  Social History   Tobacco Use   Smoking status: Never   Smokeless tobacco: Never  Vaping Use   Vaping Use: Never used  Substance Use Topics   Alcohol use: Yes    Alcohol/week: 2.0 - 3.0 standard drinks of alcohol    Types: 2 - 3 Standard drinks or equivalent per week    Comment: 5 a day   Drug use: No    Relationship status: married She lives with husband.   She is not employed. Regular exercise: Yes:   History of abuse: No  Family History:   Family History  Problem Relation Age of Onset   Other Mother        old age died 84    Deep vein thrombosis Mother    Stroke Father        age 78   Stomach cancer Neg Hx    Esophageal cancer Neg Hx    Colon cancer Neg Hx      Review of Systems: Review of Systems  Constitutional:  Negative for fever, malaise/fatigue and weight loss.  Respiratory:  Negative for cough, shortness of breath and wheezing.   Cardiovascular:  Negative for chest pain, palpitations and leg swelling.  Gastrointestinal:  Negative for abdominal pain and blood in stool.  Genitourinary:  Negative for dysuria.  Musculoskeletal:  Negative for myalgias.  Skin:  Negative for rash.  Neurological:  Negative for dizziness and headaches.  Endo/Heme/Allergies:  Does not bruise/bleed easily.  Psychiatric/Behavioral:  Negative for depression. The patient is not nervous/anxious.      OBJECTIVE Physical Exam: Vitals:   11/25/22 0837  BP: 119/72  Pulse: 66  Weight: 144 lb (65.3 kg)  Height: 5' 3.5" (1.613 m)    Physical Exam Constitutional:      General: She is not in acute distress. Pulmonary:      Effort: Pulmonary effort is normal.  Abdominal:     General: There is no distension.  Palpations: Abdomen is soft.     Tenderness: There is no abdominal tenderness. There is no rebound.  Musculoskeletal:        General: No swelling. Normal range of motion.  Skin:    General: Skin is warm and dry.     Findings: No rash.  Neurological:     Mental Status: She is alert and oriented to person, place, and time.  Psychiatric:        Mood and Affect: Mood normal.        Behavior: Behavior normal.      GU / Detailed Urogynecologic Evaluation:  Pelvic Exam: Normal external female genitalia; Bartholin's and Skene's glands normal in appearance; urethral meatus normal in appearance, no urethral masses or discharge.   CST: negative  Speculum exam reveals normal vaginal mucosa with atrophy. Cervix normal appearance. Uterus normal single, nontender. Adnexa no mass, fullness, tenderness.     Pelvic floor strength 0/V, declined rectal exam  Pelvic floor musculature: Right levator tender, Right obturator non-tender, Left levator non-tender, Left obturator non-tender  POP-Q:   POP-Q  -1                                            Aa   -1                                           Ba  -6                                              C   2.5                                            Gh  2.5                                            Pb  6.5                                            tvl   0                                            Ap  0                                            Bp  -6.5  D      Rectal Exam:  Normal external rectum. Pt declined further rectal exam due to concern for incontinence.   Post-Void Residual (PVR) by Bladder Scan: In order to evaluate bladder emptying, we discussed obtaining a postvoid residual and she agreed to this procedure.  Procedure: The ultrasound unit was placed on the patient's abdomen in the  suprapubic region after the patient had voided. A PVR of 10 ml was obtained by bladder scan.  Laboratory Results: POC urine: negative   ASSESSMENT AND PLAN Ms. Rockwood is a 77 y.o. with:  1. Overactive bladder   2. Urinary frequency   3. Incontinence of feces, unspecified fecal incontinence type   4. Prolapse of anterior vaginal wall   5. Prolapse of posterior vaginal wall    OAB - We discussed the symptoms of overactive bladder (OAB), which include urinary urgency, urinary frequency, nocturia, with or without urge incontinence.  While we do not know the exact etiology of OAB, several treatment options exist. We discussed management including behavioral therapy (decreasing bladder irritants, urge suppression strategies, timed voids, bladder retraining), physical therapy, medication; for refractory cases posterior tibial nerve stimulation, sacral neuromodulation, and intravesical botulinum toxin injection.  - She is not interested in medication.  - Recommended decreasing tea/ caffeine intake.  - She may be a good candidate for PTNS or SNM. Handouts provided. She would like to try pelvic PT again, referral placed.   2. Accidental Bowel Leakage:  - Treatment options include anti-diarrhea medication (loperamide/ Imodium OTC or prescription lomotil), fiber supplements, physical therapy, and possible sacral neuromodulation or surgery.   - Start with psyllium fiber supplement daily for stool bulking - She should return for GI for colonoscopy that they recommended - considering SNM.   3. Stage II anterior, Stage II posterior, Stage I apical prolapse - She is doing well with the pessary and wants to continue for now.  - We discussed that fixing the prolapse will not change her incontinence symptoms.   Return 2 months or sooner if needed  Jaquita Folds, MD

## 2022-11-25 NOTE — Patient Instructions (Addendum)
Today we talked about ways to manage bladder urgency such as altering your diet to avoid irritative beverages and foods (bladder diet) as well as attempting to decrease stress and other exacerbating factors.    The Most Bothersome Foods* The Least Bothersome Foods*  Coffee - Regular & Decaf Tea - caffeinated Carbonated beverages - cola, non-colas, diet & caffeine-free Alcohols - Beer, Red Wine, White Wine, Champagne Fruits - Grapefruit, [place], Orange, [employer] - Cranberry, Grapefruit, Orange, Pineapple Vegetables - Tomato & Tomato Products Flavor Enhancers - Hot peppers, Spicy foods, Chili, Horseradish, Vinegar, Monosodium glutamate (MSG) Artificial Sweeteners - NutraSweet, Sweet 'N Low, Equal (sweetener), Saccharin Ethnic foods - [place], [place], [place] food [employer] - low-fat & whole Fruits - Bananas, Blueberries, Honeydew melon, Pears, Raisins, Watermelon Vegetables - Broccoli, Brussels Sprouts, [place], Carrots, Cauliflower, [place], Cucumber, Mushrooms, Peas, Radishes, Squash, Zucchini, White potatoes, Sweet potatoes & yams Poultry - Chicken, Eggs, [place], [employer] - Beef, Programmer, multimedia, Lamb Seafood - Shrimp, [place] fish, Salmon Grains - Oat, Rice Snacks - Pretzels, Popcorn  *Lissa Morales et al. Diet and its role in interstitial cystitis/bladder pain syndrome (IC/BPS) and comorbid conditions. Ger[place] 2012 Jan 11.   Accidental Bowel Leakage: Our goal is to achieve formed bowel movements daily or every-other-day without leakage.  You may need to try different combinations of the following options to find what works best for you.  Some management options include: Dietary changes (more leafy greens, vegetables and fruits; less processed foods) Fiber supplementation (Metamucil or something with psyllium as active ingredient)- increase to one tablespoon a day.  Over-the-counter imodium (tablets or liquid) to help solidify the stool and prevent leakage of stool.

## 2022-12-15 ENCOUNTER — Telehealth: Payer: Self-pay | Admitting: *Deleted

## 2022-12-15 ENCOUNTER — Encounter: Payer: Self-pay | Admitting: *Deleted

## 2022-12-15 NOTE — Telephone Encounter (Signed)
Teams message was sent stating the patient has arrived to the back for testing.  I called the patient and apologized for the error as her appt is on tomorrow.  Patient agreed to arrive on 2/20 at 3pm for testing prior to the appt with Dr Elease Hashimoto.

## 2022-12-16 ENCOUNTER — Ambulatory Visit (INDEPENDENT_AMBULATORY_CARE_PROVIDER_SITE_OTHER): Payer: Medicare HMO | Admitting: Family Medicine

## 2022-12-16 ENCOUNTER — Encounter: Payer: Self-pay | Admitting: Family Medicine

## 2022-12-16 VITALS — BP 100/60 | HR 64 | Temp 97.8°F | Ht 63.5 in | Wt 145.1 lb

## 2022-12-16 DIAGNOSIS — R062 Wheezing: Secondary | ICD-10-CM

## 2022-12-16 DIAGNOSIS — R051 Acute cough: Secondary | ICD-10-CM | POA: Diagnosis not present

## 2022-12-16 DIAGNOSIS — R0981 Nasal congestion: Secondary | ICD-10-CM | POA: Diagnosis not present

## 2022-12-16 LAB — POC COVID19 BINAXNOW: SARS Coronavirus 2 Ag: NEGATIVE

## 2022-12-16 MED ORDER — PREDNISONE 20 MG PO TABS: ORAL_TABLET | ORAL | 0 refills | Status: DC

## 2022-12-16 MED ORDER — ALBUTEROL SULFATE HFA 108 (90 BASE) MCG/ACT IN AERS: 2.0000 | INHALATION_SPRAY | Freq: Four times a day (QID) | RESPIRATORY_TRACT | 0 refills | Status: DC | PRN

## 2022-12-16 NOTE — Progress Notes (Signed)
Established Patient Office Visit  Subjective   Patient ID: Laura Curtis, female    DOB: 02-05-1946  Age: 77 y.o. MRN: [id number]  Chief Complaint  Patient presents with   Cough    Patient complains of cough, x4 days, Tried Robitussin and Tylenol    Nasal Congestion    Patient complains of nasal congestion, x4 days, Tried Robitussin and Tylenol     HPI   Ms. Laura Curtis is seen with chief complaint of increased cough and some nasal congestion.  Started around last Thursday.  Cough initially dry but has become more productive.  No fever.  No chills.  No dyspnea.  She feels like she has increased mucus production and some wheezing.  Has taken Robitussin DM with some relief.  Her main concern was ruling out pneumonia.  She has no chronic lung issues.  Generally very healthy.  No history of smoking.  Her daughter in [employer] apparently had similar symptoms last week.  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   Hx of varicella    HX: benign breast biopsy    Left rotator cuff tear 01/14/2012   Supraspinatus tear noted on ultrasound. Provided instructions regarding nitroglycerin patches 1/4 patch daily to shoulder.   To continue circular motion exercises and range of motion. No over the head exercises.  No weight bearing on that side. She can continue to play tennis as tolerated.  FU in 2 weeks if no improvement, 4 weeks if improved.       Shoulder pain, left 12/31/2011   History of rotator cuff tear on the right and now has  nontraumatic left shoulder pain    Syncope    June, 2013   Uterine prolapse    Past Surgical History:  Procedure Laterality Date   [place]     for squamous cell ca left nose   OVARIAN CYST SURGERY  1968   REPLACEMENT TOTAL KNEE Right    ROTATOR CUFF REPAIR  2001   TONSILLECTOMY AND ADENOIDECTOMY     TUBAL LIGATION      reports that she has never smoked. She has never used smokeless tobacco. She reports current alcohol use of about 2.0 - 3.0 standard drinks of alcohol per week. She reports that she does not use drugs. family history includes Deep vein thrombosis in her mother; Other in her mother; Stroke in her father. Allergies  Allergen Reactions   Hydrocodone Other (See Comments), Nausea Only and Nausea And Vomiting    Review of Systems  Constitutional:  Negative for chills and fever.  Respiratory:  Positive for cough, sputum production and wheezing. Negative for hemoptysis.   Cardiovascular:  Negative for chest pain.      Objective:     BP 100/60 (BP Location: Left Arm, Patient Position: Sitting, Cuff Size: Normal)   Pulse  64   Temp 97.8 F (36.6 C) (Oral)   Ht 5' 3.5" (1.613 m)   Wt 145 lb 1.6 oz (65.8 kg)   LMP 10/28/1995   SpO2 97%   BMI 25.30 kg/m  BP Readings from Last 3 Encounters:  12/16/22 100/60  11/25/22 119/72  08/18/22 118/72   Wt Readings from Last 3 Encounters:  12/16/22 145 lb 1.6 oz (65.8 kg)  11/25/22 144 lb (65.3 kg)  08/18/22 142 lb (64.4 kg)      Physical Exam Vitals reviewed.  Constitutional:      General: She is not in acute distress.    Appearance: Normal appearance.  Cardiovascular:     Rate and Rhythm: Normal rate and regular rhythm.  Pulmonary:     Effort: Pulmonary effort is normal.     Breath sounds: Wheezing present. No rales.     Comments: She has some scattered wheezes.  No rales.  No retractions.  O2 sat 97% room air Neurological:     Mental Status: She is alert.       Results for orders placed or performed in visit on 12/16/22  POC COVID-19  Result Value Ref Range   SARS Coronavirus 2 Ag Negative Negative      The ASCVD Risk score (Arnett DK, et al., 2019) failed to calculate for the following reasons:   Cannot find a previous HDL lab   Cannot find a previous total cholesterol lab    Assessment & Plan:   Patient seen for acute visit with cough with reactive airway component.  No respiratory distress.  COVID test at home and here negative.  Does not have any fever, rales, hypoxia, or other findings to suggest likely pneumonia at this time  -We discussed starting prednisone 40 mg daily for 5 days -Albuterol MDI 2 puffs every 4-6 hours as needed for cough and wheeze -Follow-up immediately for any fever or increased shortness of breath   No follow-ups on file.    Carolann Littler, MD

## 2022-12-18 DIAGNOSIS — Z791 Long term (current) use of non-steroidal anti-inflammatories (NSAID): Secondary | ICD-10-CM | POA: Diagnosis not present

## 2022-12-18 DIAGNOSIS — R32 Unspecified urinary incontinence: Secondary | ICD-10-CM | POA: Diagnosis not present

## 2022-12-18 DIAGNOSIS — M1990 Unspecified osteoarthritis, unspecified site: Secondary | ICD-10-CM | POA: Diagnosis not present

## 2022-12-18 DIAGNOSIS — Z823 Family history of stroke: Secondary | ICD-10-CM | POA: Diagnosis not present

## 2022-12-18 DIAGNOSIS — Z008 Encounter for other general examination: Secondary | ICD-10-CM | POA: Diagnosis not present

## 2022-12-18 DIAGNOSIS — Z85828 Personal history of other malignant neoplasm of skin: Secondary | ICD-10-CM | POA: Diagnosis not present

## 2022-12-19 DIAGNOSIS — R69 Illness, unspecified: Secondary | ICD-10-CM | POA: Diagnosis not present

## 2022-12-23 ENCOUNTER — Encounter: Payer: Self-pay | Admitting: Emergency Medicine

## 2022-12-23 ENCOUNTER — Ambulatory Visit (INDEPENDENT_AMBULATORY_CARE_PROVIDER_SITE_OTHER): Payer: Medicare HMO | Admitting: Emergency Medicine

## 2022-12-23 VITALS — BP 110/68 | HR 63 | Temp 98.0°F | Ht 63.5 in | Wt 145.4 lb

## 2022-12-23 DIAGNOSIS — R0989 Other specified symptoms and signs involving the circulatory and respiratory systems: Secondary | ICD-10-CM | POA: Diagnosis not present

## 2022-12-23 DIAGNOSIS — J22 Unspecified acute lower respiratory infection: Secondary | ICD-10-CM | POA: Diagnosis not present

## 2022-12-23 DIAGNOSIS — R0981 Nasal congestion: Secondary | ICD-10-CM

## 2022-12-23 DIAGNOSIS — R6889 Other general symptoms and signs: Secondary | ICD-10-CM | POA: Diagnosis not present

## 2022-12-23 MED ORDER — AMOXICILLIN-POT CLAVULANATE 875-125 MG PO TABS: 1.0000 | ORAL_TABLET | Freq: Two times a day (BID) | ORAL | 0 refills | Status: AC

## 2022-12-23 NOTE — Patient Instructions (Signed)

## 2022-12-23 NOTE — Assessment & Plan Note (Signed)
Advised to rest and stay well-hydrated Continue over-the-counter medications

## 2022-12-23 NOTE — Assessment & Plan Note (Signed)
Viral upper respiratory infection now with secondary bacterial infection.  Progressively getting worse over the last 10 days.  Will benefit from daily Augmentin 875 mg twice a day for at least 7 days. Clinically stable.  No signs of pneumonia.  Oxygenating well.  Afebrile.

## 2022-12-23 NOTE — Progress Notes (Signed)
Laura Curtis 77 y.o.   Chief Complaint  Patient presents with   Acute Visit    Cough, congestion, headaches, chills x 10 days     HISTORY OF PRESENT ILLNESS: Acute problem visit today. This is a 77 y.o. female complaining of flulike symptoms that started about 10 days ago progressively getting worse.  Mostly complaining of productive cough,.  Purulent nasal discharge, sinus and chest congestion, No other associated symptoms No other complaints or medical concerns today.  HPI   Prior to Admission medications   Medication Sig Start Date End Date Taking? Authorizing Provider  albuterol (VENTOLIN HFA) 108 (90 Base) MCG/ACT inhaler Inhale 2 puffs into the lungs every 6 (six) hours as needed for wheezing or shortness of breath. 12/16/22  Yes Burchette, Alinda Sierras, MD  estradiol (ESTRACE) 0.1 MG/GM vaginal cream 1 gram vaginally twice weekly 02/11/21  Yes Megan Salon, MD  meloxicam (MOBIC) 7.5 MG tablet Take 1 tablet (7.5 mg total) by mouth daily. May increase to twice a day  if needed 10/30/22  Yes Panosh, Standley Brooking, MD    Allergies  Allergen Reactions   Hydrocodone Other (See Comments), Nausea Only and Nausea And Vomiting    Patient Active Problem List   Diagnosis Date Noted   Primary osteoarthritis of both first carpometacarpal joints 10/04/2021   Radial nerve compression, right 10/04/2021   Cervical spine arthritis 10/04/2021   Arthritis of midfoot, left 07/23/2021   S/P TKR (total knee replacement) using cement, right 12/07/2019   Vaginal atrophy 10/06/2019   Incomplete uterine prolapse 10/06/2019   Hearing aid worn    Hx of syncope 07/21/2012   Counseling on health promotion and disease prevention 07/21/2012   Hx of nonmelanoma skin cancer 07/21/2012   History of positive PPD    HX: benign breast biopsy     Past Medical History:  Diagnosis Date   Arthritis    Biceps tendinopathy 01/14/2012   Cancer ([place])    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   Hx of varicella    HX: benign breast biopsy    Left rotator cuff tear 01/14/2012   Supraspinatus tear noted on ultrasound. Provided instructions regarding nitroglycerin patches 1/4 patch daily to shoulder.   To continue circular motion exercises and range of motion. No over the head exercises.  No weight bearing on that side. She can continue to play tennis as tolerated.  FU in 2 weeks if no improvement, 4 weeks if improved.       Shoulder pain, left 12/31/2011   History of rotator cuff tear on the right and now has nontraumatic left shoulder pain    Syncope    June, 2013   Uterine prolapse     Past Surgical History:  Procedure Laterality Date   [place]     for squamous cell ca left nose   OVARIAN CYST SURGERY  1968   REPLACEMENT TOTAL KNEE Right    ROTATOR CUFF REPAIR  2001   TONSILLECTOMY AND ADENOIDECTOMY  TUBAL LIGATION      Social History   Socioeconomic History   Marital status: Married    Spouse name: Not on file   Number of children: 3   Years of education: Not on file   Highest education level: Not on file  Occupational History   Occupation: radiation therapist  Tobacco Use   Smoking status: Never   Smokeless tobacco: Never  Vaping Use   Vaping Use: Never used  Substance and Sexual Activity   Alcohol use: Yes    Alcohol/week: 2.0 - 3.0 standard drinks of alcohol    Types: 2 - 3 Standard drinks or equivalent per week    Comment: 5 a day   Drug use: No   Sexual activity: Not Currently    Birth control/protection: None, Surgical    Comment: BTL  Other Topics Concern   Not on file  Social History Narrative   hh of 2  married      retired radiation oncology asrt.   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 Sees dentist on a regular basis.    etoh 3 x per week                Social Determinants of Health   Financial Resource Strain: Low Risk  (07/07/2022)   Overall Financial Resource Strain (CARDIA)    Difficulty of Paying Living Expenses: Not hard at all  Food Insecurity: No Food Insecurity (07/07/2022)   Hunger Vital Sign    Worried About Running Out of Food in the Last Year: Never true    Ran Out of Food in the Last Year: Never true  Transportation Needs: No Transportation Needs (07/07/2022)   PRAPARE - Hydrologist (Medical): No    Lack of Transportation (Non-Medical): No  Physical Activity: Sufficiently Active (07/07/2022)   Exercise Vital Sign    Days of Exercise per Week: 7 days    Minutes of Exercise per Session: 90 min  Stress: No Stress Concern Present (07/07/2022)   [place]    Feeling of Stress : Not at all  Social Connections: Unknown (03/26/2022)   Social Connection and Isolation Panel [NHANES]    Frequency of Communication with Friends and Family: More than three times a week    Frequency of Social Gatherings with Friends and Family: More than three times a week    Attends Religious Services: Patient refused    Marine scientist or Organizations: Patient refused    Attends Music therapist: Not on file    Marital Status: Married  Human resources officer Violence: Not on file    Family History  Problem Relation Age of Onset   Other Mother        old age died 53    Deep vein thrombosis Mother    Stroke Father        age 39   Stomach cancer Neg Hx    Esophageal cancer Neg Hx    Colon cancer Neg Hx      Review of Systems  Constitutional:  Positive for chills. Negative  for fever.  HENT:  Positive for congestion and sore throat.   Respiratory:  Positive for cough, sputum production and wheezing. Negative for hemoptysis and shortness of breath.   Cardiovascular: Negative.  Negative for chest pain and palpitations.  Gastrointestinal: Negative.  Negative for abdominal pain, diarrhea, nausea and vomiting.  Genitourinary: Negative.  Negative for dysuria and hematuria.  Skin: Negative.  Negative for rash.  Neurological: Negative.  Negative for dizziness and headaches.  All other systems reviewed and are negative.  Today's Vitals   12/23/22 1520  BP: 110/68  Pulse: 63  Temp: 98 F (36.7 C)  TempSrc: Oral  SpO2: 95%  Weight: 145 lb 6 oz (65.9 kg)  Height: 5' 3.5" (1.613 m)   Body mass index is 25.35 kg/m.   Physical Exam Vitals reviewed.  Constitutional:      Appearance: Normal appearance.  HENT:     Head: Normocephalic.     Right Ear: Tympanic membrane, ear canal and external ear normal.     Left Ear: Tympanic membrane, ear canal and external ear normal.     Ears:     Comments: Uses hearing aids    Nose: Congestion present.     Mouth/Throat:     Mouth: Mucous membranes are moist.     Pharynx: Oropharynx is clear. No oropharyngeal exudate or posterior oropharyngeal erythema.  Eyes:     Extraocular Movements: Extraocular movements intact.     Conjunctiva/sclera: Conjunctivae normal.     Pupils: Pupils are equal, round, and reactive to light.  Cardiovascular:     Rate and Rhythm: Normal rate and regular rhythm.     Pulses: Normal pulses.     Heart sounds: Normal heart sounds.  Pulmonary:     Effort: Pulmonary effort is normal.     Breath sounds: Rhonchi present. No wheezing or rales.  Musculoskeletal:     Cervical back: No tenderness.  Lymphadenopathy:     Cervical: No cervical adenopathy.  Skin:    General: Skin is warm and dry.     Capillary Refill: Capillary refill takes less than 2 seconds.  Neurological:     Mental Status: She is  alert.  Psychiatric:        Mood and Affect: Mood normal.        Behavior: Behavior normal.      ASSESSMENT & PLAN:  Problem List Items Addressed This Visit       Respiratory   Lower respiratory infection - Primary    Viral upper respiratory infection now with secondary bacterial infection.  Progressively getting worse over the last 10 days.  Will benefit from daily Augmentin 875 mg twice a day for at least 7 days. Clinically stable.  No signs of pneumonia.  Oxygenating well.  Afebrile.      Relevant Medications   amoxicillin-clavulanate (AUGMENTIN) 875-125 MG tablet   Sinus congestion    Recommend to use saline nasal sprays often during the day      Chest congestion    Continue over-the-counter Mucinex dm and cough drops        Other   Flu-like symptoms    Advised to rest and stay well-hydrated Continue over-the-counter medications      Patient Instructions  Cough, Adult A cough helps to clear your throat and lungs. It may be a sign of an illness or another condition. A short-term (acute) cough may last 2-3 weeks. A long-term (chronic) cough may last 8 or more weeks. Many things can cause a cough. They include: Illnesses such as: An infection in your throat or lungs. Asthma or other heart or lung problems. Gastroesophageal reflux. This is when acid comes back up from your stomach. Breathing in things  that bother (irritate) your lungs. Allergies. Postnasal drip. This is when mucus runs down the back of your throat. Smoking. Some medicines. Follow these instructions at home: Medicines Take over-the-counter and prescription medicines only as told by your doctor. Talk with your doctor before you take cough medicine (cough suppressants). Eating and drinking Do not drink alcohol. Do not drink caffeine. Drink enough fluid to keep your pee (urine) pale yellow. Lifestyle Stay away from cigarette smoke. Do not smoke or use any products that contain nicotine or  tobacco. If you need help quitting, ask your doctor. Stay away from things that make you cough. These may include perfume, candles, cleaning products, or campfire smoke. General instructions  Watch for any changes to your cough. Tell your doctor about them. Always cover your mouth when you cough. If the air is dry in your home, use a cool mist vaporizer or humidifier. If your cough is worse at night, try using extra pillows to raise your head up higher while you sleep. Rest as needed. Contact a doctor if: You have new symptoms. Your symptoms get worse. You cough up pus. You have a fever that does not go away. Your cough does not get better after 2-3 weeks. Cough medicine does not help, and you are not sleeping well. You have pain that gets worse or is not helped with medicine. You are losing weight and do not know why. You have night sweats. Get help right away if: You cough up blood. You have trouble breathing. Your heart is beating very fast. These symptoms may be an emergency. Get help right away. Call 911. Do not wait to see if the symptoms will go away. Do not drive yourself to the hospital. This information is not intended to replace advice given to you by your health care provider. Make sure you discuss any questions you have with your health care provider. Document Revised: 06/13/2022 Document Reviewed: 06/13/2022 Elsevier Patient Education  [place], MD [facility]

## 2022-12-23 NOTE — Assessment & Plan Note (Signed)
Recommend to use saline nasal sprays often during the day

## 2022-12-23 NOTE — Assessment & Plan Note (Signed)
Continue over-the-counter Mucinex dm and cough drops

## 2022-12-31 ENCOUNTER — Telehealth: Payer: Self-pay | Admitting: Internal Medicine

## 2022-12-31 ENCOUNTER — Encounter: Payer: Self-pay | Admitting: Family Medicine

## 2022-12-31 ENCOUNTER — Ambulatory Visit (INDEPENDENT_AMBULATORY_CARE_PROVIDER_SITE_OTHER): Payer: Medicare HMO | Admitting: Family Medicine

## 2022-12-31 ENCOUNTER — Ambulatory Visit (INDEPENDENT_AMBULATORY_CARE_PROVIDER_SITE_OTHER)
Admission: RE | Admit: 2022-12-31 | Discharge: 2022-12-31 | Disposition: A | Discharge status: Home / Self Care | Payer: Medicare HMO | Source: Ambulatory Visit | Attending: Family Medicine | Admitting: Family Medicine

## 2022-12-31 VITALS — BP 98/60 | HR 65 | Temp 97.6°F | Wt 147.4 lb

## 2022-12-31 DIAGNOSIS — J069 Acute upper respiratory infection, unspecified: Secondary | ICD-10-CM

## 2022-12-31 DIAGNOSIS — J439 Emphysema, unspecified: Secondary | ICD-10-CM | POA: Diagnosis not present

## 2022-12-31 DIAGNOSIS — R052 Subacute cough: Secondary | ICD-10-CM

## 2022-12-31 DIAGNOSIS — R059 Cough, unspecified: Secondary | ICD-10-CM | POA: Diagnosis not present

## 2022-12-31 NOTE — Progress Notes (Signed)
   Subjective:    Patient ID: Laura Curtis, female    DOB: [birth year], 77 y.o.   MRN: [id number]  HPI Here for 3 weeks of coughing up clear sputum. No chest pain or fever. She saw Dr. Elease Hashimoto on 12-16-22, and he felt this was a viral infection. She tested negative for Covid. He gave her a Prednisone taper, and this helped her breathing. He also gave her an albuterol inhaler, but she has not filled that RX. She did not improve so she saw Dr. Mitchel Honour on 12-23-22, and he started her on 10 days of Augmentin. Today she says she still feels no better. She has a cough that is either dry or produces clear sputum, and she gets SOB with exertion. Still no fevers.    Review of Systems  Constitutional: Negative.   HENT:  Negative for congestion, ear pain, postnasal drip, sinus pressure and sore throat.   Eyes: Negative.   Respiratory:  Positive for cough and shortness of breath. Negative for wheezing.   Cardiovascular: Negative.        Objective:   Physical Exam Constitutional:      Appearance: Normal appearance. She is not ill-appearing.  HENT:     Right Ear: Tympanic membrane, ear canal and external ear normal.     Left Ear: Tympanic membrane, ear canal and external ear normal.     Nose: Nose normal.     Mouth/Throat:     Pharynx: Oropharynx is clear.  Eyes:     Conjunctiva/sclera: Conjunctivae normal.  Pulmonary:     Effort: Pulmonary effort is normal.     Breath sounds: Normal breath sounds.  Lymphadenopathy:     Cervical: No cervical adenopathy.  Neurological:     Mental Status: She is alert.           Assessment & Plan:  Viral URI, very likely due to the RSV virus. She will finish up the Augmentin. We will send her for a CXR today. I advised her to fill the albuterol RX and use this as needed. This infection should start resolving any day now. She will follow up as needed.  Alysia Penna, MD

## 2022-12-31 NOTE — Telephone Encounter (Addendum)
Pt was seen this morning by Dr. Sarajane Jews  Pt states she saw her preliminary chest xray results and has some concerns.  Pt is also wondering if she should continue taking the antibiotics (start another round)?  Pt states she only has enough antibiotics to last until Friday?  Pt would like a call back to discuss.  Pt also requested an in office appointment for Friday - 01/02/23, just in case.  Please advise.

## 2023-01-01 NOTE — Telephone Encounter (Signed)
Reviewed labs and chest x-ray results with pt, verbalized understanding, pt copies of labs printed out for pt to pick up at the office, placed at the front office

## 2023-01-02 ENCOUNTER — Ambulatory Visit (INDEPENDENT_AMBULATORY_CARE_PROVIDER_SITE_OTHER): Payer: Medicare HMO | Admitting: Family Medicine

## 2023-01-02 ENCOUNTER — Encounter: Payer: Self-pay | Admitting: Family Medicine

## 2023-01-02 VITALS — BP 110/62 | HR 68 | Temp 98.2°F | Wt 146.8 lb

## 2023-01-02 DIAGNOSIS — J069 Acute upper respiratory infection, unspecified: Secondary | ICD-10-CM

## 2023-01-02 DIAGNOSIS — J439 Emphysema, unspecified: Secondary | ICD-10-CM

## 2023-01-02 NOTE — Progress Notes (Signed)
   Subjective:    Patient ID: Laura Curtis, female    DOB: 07-11-1946, 77 y.o.   MRN: [id number]  HPI Here to follow up on 3 weeks of coughing, and this is either dry or it produces clear sputum. No chest pain or fever. She does feel SOB at times. We saw her on 12-31-22 and her lungs were clear on exam.  A CXR that day showed no acute processes, but it did show some emphysema. She has never been diagnosed with this, but I see her lungs were described as "hyperexpanded" on a CXR from 2018. She has never smoked. Today she will finish a 10 day course of Augmentin, though I believe her recent URI was likely viral. She feels much better now, and the cough has almost stopped.    Review of Systems  Constitutional: Negative.   HENT: Negative.    Eyes: Negative.   Respiratory:  Positive for cough. Negative for shortness of breath and wheezing.   Cardiovascular: Negative.        Objective:   Physical Exam Constitutional:      Appearance: Normal appearance.  Cardiovascular:     Rate and Rhythm: Normal rate and regular rhythm.     Pulses: Normal pulses.     Heart sounds: Normal heart sounds.  Pulmonary:     Effort: Pulmonary effort is normal.     Breath sounds: Normal breath sounds.  Neurological:     Mental Status: She is alert.           Assessment & Plan:  She is recovering from a viral URI (likely RSV), and a recent CXR demonstrated emphysema. We will refer her to Pulmonology to evaluate this further.  Alysia Penna, MD

## 2023-01-07 ENCOUNTER — Encounter: Payer: Self-pay | Admitting: Family Medicine

## 2023-01-12 ENCOUNTER — Encounter: Payer: Self-pay | Admitting: Physical Therapy

## 2023-01-12 ENCOUNTER — Ambulatory Visit: Payer: Medicare HMO | Attending: Obstetrics and Gynecology | Admitting: Physical Therapy

## 2023-01-12 ENCOUNTER — Other Ambulatory Visit: Payer: Self-pay

## 2023-01-12 DIAGNOSIS — R159 Full incontinence of feces: Secondary | ICD-10-CM | POA: Diagnosis not present

## 2023-01-12 DIAGNOSIS — R279 Unspecified lack of coordination: Secondary | ICD-10-CM | POA: Insufficient documentation

## 2023-01-12 DIAGNOSIS — M6281 Muscle weakness (generalized): Secondary | ICD-10-CM | POA: Insufficient documentation

## 2023-01-12 DIAGNOSIS — N3281 Overactive bladder: Secondary | ICD-10-CM | POA: Diagnosis not present

## 2023-01-12 DIAGNOSIS — R293 Abnormal posture: Secondary | ICD-10-CM | POA: Diagnosis not present

## 2023-01-12 NOTE — Therapy (Signed)
OUTPATIENT PHYSICAL THERAPY FEMALE PELVIC EVALUATION   Patient Name: Laura Curtis MRN: [id number] DOB:06-07-1946, 77 y.o., female Today's Date: 01/12/2023  END OF SESSION:  PT End of Session - 01/12/23 1535     Visit Number 1    Date for PT Re-Evaluation 04/06/23    Authorization Type aetna medicare    Progress Note Due on Visit 10    PT Start Time 1524    PT Stop Time 1608    PT Time Calculation (min) 44 min    Activity Tolerance Patient tolerated treatment well    Behavior During Therapy [facility] for tasks assessed/performed             Past Medical History:  Diagnosis Date   Arthritis    Biceps tendinopathy 01/14/2012   Cancer ([place])    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   Hx of varicella    HX: benign breast biopsy    Left rotator cuff tear 01/14/2012   Supraspinatus tear noted on ultrasound. Provided instructions regarding nitroglycerin patches 1/4 patch daily to shoulder.   To continue circular motion exercises and range of motion. No over the head exercises.  No weight bearing on that side. She can continue to play tennis as tolerated.  FU in 2 weeks if no improvement, 4 weeks if improved.       Shoulder pain, left 12/31/2011   History of rotator cuff tear on the right and now has nontraumatic left shoulder pain    Syncope    June, 2013   Uterine prolapse    Past Surgical History:  Procedure Laterality Date   [place]     for squamous cell ca left nose   OVARIAN CYST SURGERY  1968   REPLACEMENT TOTAL KNEE Right    ROTATOR CUFF REPAIR  2001   TONSILLECTOMY AND ADENOIDECTOMY     TUBAL LIGATION     Patient  Active Problem List   Diagnosis Date Noted   Lower respiratory infection 12/23/2022   Flu-like symptoms 12/23/2022   Sinus congestion 12/23/2022   Chest congestion 12/23/2022   Primary osteoarthritis of both first carpometacarpal joints 10/04/2021   Radial nerve compression, right 10/04/2021   Cervical spine arthritis 10/04/2021   Arthritis of midfoot, left 07/23/2021   S/P TKR (total knee replacement) using cement, right 12/07/2019   Vaginal atrophy 10/06/2019   Incomplete uterine prolapse 10/06/2019   Hearing aid worn    Hx of syncope 07/21/2012   Counseling on health promotion and disease prevention 07/21/2012   Hx of nonmelanoma skin cancer 07/21/2012   History of positive PPD    HX: benign breast biopsy     PCP: Burnis Medin, MD   REFERRING PROVIDER:   Jaquita Folds, MD    REFERRING DIAG:  R15.9 (ICD-10-CM) - Incontinence of feces, unspecified fecal incontinence type  N32.81 (ICD-10-CM) - Overactive bladder    THERAPY DIAG:  Muscle weakness (generalized)  Abnormal posture  Unspecified lack of coordination  Rationale for Evaluation and Treatment: Rehabilitation  ONSET DATE: slowly over 2 years, after a knee replaced and had to go on antibiotics for 3 months and that changed a lot  SUBJECTIVE:                                                                                                                                                                                           SUBJECTIVE STATEMENT: Pt states she changed her diet and using metamucil.  Pt had RSV for 5 weeks and now not coughing so that has gotten better.  Sometimes has urgency and feels like something leaked but there is nothing there.   Fluid intake: Yes: 1 tea 12 oz x 4/day    PAIN:  Are you having pain? No   PRECAUTIONS: None  WEIGHT BEARING RESTRICTIONS: No  FALLS:  Has patient fallen in last 6 months? No  LIVING ENVIRONMENT: Lives with: lives with their spouse Lives in:  House/apartment   OCCUPATION: playing pickle ball until getting sick, books and sewing  PLOF: Independent  PATIENT GOALS: establish some thing to prevent things from getting weaker  PERTINENT HISTORY:  3 vaginal deliveries, RSV, long term antibiotics (3 months) Sexual abuse: No  BOWEL MOVEMENT: Pain with bowel movement: No Type of bowel movement:Type (Bristol Stool Scale) normal to soft, Frequency daily, and Strain No Fully empty rectum: Yes:   Leakage: Yes: but barely have had with gas a very small amount Pads: No Fiber supplement: No  URINATION: Pain with urination: No Fully empty bladder: Yes:   Stream:  cannot feel the stream and unknown if I finished Urgency: Yes:   Frequency: normal Leakage: Coughing (not currently just when sick) Pads: No  INTERCOURSE: Pain with intercourse: not sexually active   PREGNANCY: Vaginal deliveries 3 Tearing Yes: 1st one had stitches   PROLAPSE: None   OBJECTIVE:   DIAGNOSTIC FINDINGS:    PATIENT SURVEYS:    PFIQ-7   COGNITION: Overall cognitive status: Within functional limits for tasks assessed     SENSATION: Light touch:   Proprioception: Appears intact  MUSCLE LENGTH: Hamstrings: normal Thomas test:   LUMBAR SPECIAL TESTS:  ASLR normal  FUNCTIONAL TESTS:  Single leg stand Rt side a little less stable (knee replaced) than Lt; unsteady and 3 sec hold  GAIT:  Comments: WFL   POSTURE: rounded shoulders, increased lumbar lordosis, increased thoracic kyphosis, and anterior pelvic tilt  PELVIC ALIGNMENT: normal LUMBARAROM/PROM:  A/PROM A/PROM  eval  Flexion 80%  Extension   Right lateral flexion   Left lateral flexion   Right rotation   Left rotation    (Blank rows = not tested)  LOWER EXTREMITY ROM:  Passive ROM Right eval Left eval  Hip flexion [facility]   Hip extension    Hip abduction    Hip adduction    Hip internal rotation 50% 50%  Hip external rotation 30% 30%  Knee flexion     Knee extension    Ankle dorsiflexion    Ankle plantarflexion    Ankle inversion    Ankle eversion     (Blank rows = not tested)  LOWER EXTREMITY MMT:  MMT Right eval Left eval  Hip flexion 4/5   Hip extension    Hip abduction 4/5   Hip adduction    Hip internal rotation    Hip external rotation    Knee flexion    Knee extension    Ankle dorsiflexion    Ankle plantarflexion    Ankle inversion    Ankle eversion     PALPATION:   General  adductors, gluteals, lumbar paraspinals tight                External Perineal Exam pale in color                             Internal Pelvic Floor chest breathing and holding breath to engage the pelvic floor  Patient confirms identification and approves PT to assess internal pelvic floor and treatment Yes  PELVIC MMT:   MMT eval  Vaginal 2/5 x 1 for 2 sec hold  Internal Anal Sphincter   External Anal Sphincter   Puborectalis   Diastasis Recti   (Blank rows = not tested)        TONE: High posterior, low tone anterior  PROLAPSE:   TODAY'S TREATMENT:                                                                                                                              DATE: 01/12/23  EVAL and educated on urge and piriformis stretch in sitting   PATIENT EDUCATION:  Education details: urge technique Person educated: Patient Education method: Consulting civil engineer, Media planner, Verbal cues, and Handouts Education comprehension: verbalized understanding and returned demonstration  HOME EXERCISE PROGRAM: Not issued  ASSESSMENT:  CLINICAL IMPRESSION: Patient is a 77 y.o. female who was seen today for physical therapy evaluation and treatment for incontinence.  Pt was having issue when she had RSV and was coughing a lot.  Pt has posture as noted above with increased kyphosis . Pt has some hip weakness on the Rt side as well as history of knee replacement.  Pt has weakness and decreased endurance of the pelvic floor with  diminished circular contraction and increase tone of the posterior compartment. Pt uses co-contraction and holding breath in her chest when attempting to do a kegel. Pt will benefit from skilled PT to improve coordination for better pressure management and improved ability to prevent leakage or risk of increased prolapse  OBJECTIVE IMPAIRMENTS: decreased coordination, decreased  endurance, decreased ROM, decreased strength, increased muscle spasms, impaired flexibility, impaired tone, and postural dysfunction.   ACTIVITY LIMITATIONS: continence and toileting  PARTICIPATION LIMITATIONS: community activity  PERSONAL FACTORS: 1-2 comorbidities: vaginal deliveries, RSV  are also affecting patient's functional outcome.   REHAB POTENTIAL: Excellent  CLINICAL DECISION MAKING: Stable/uncomplicated  EVALUATION COMPLEXITY: Low   GOALS: Goals reviewed with patient? Yes  SHORT TERM GOALS: Target date: 02/09/23  Ind with initial HEP Baseline: Goal status: INITIAL  LONG TERM GOALS: Target date: 04/06/23  Pt will be independent with advanced HEP to maintain improvements made throughout therapy  Baseline:  Goal status: INITIAL  2.  Pt will be able to functional actions such as coughing for a bout of 30 seconds without leakage  Baseline:  Goal status: INITIAL  3.  Pt will report her BMs are complete due to improved bowel habits and evacuation techniques.  Baseline:  Goal status: INITIAL  4.  Pt will report improved sensation of when her bladder is full due to improved soft tissue health of the pelvic floor Baseline:  Goal status: INITIAL    PLAN:  PT FREQUENCY: 1x/week  PT DURATION: 12 weeks  PLANNED INTERVENTIONS: Therapeutic exercises, Therapeutic activity, Neuromuscular re-education, Balance training, Gait training, Patient/Family education, Self Care, Joint mobilization, Dry Needling, Electrical stimulation, Cryotherapy, Moist heat, Taping, Biofeedback, Manual therapy, and  Re-evaluation  PLAN FOR NEXT SESSION: toileting technique follow up on stool; exhale with kegel and engaging core correctly, maybe biofeedback , work on Wellsite geologist and posterior muscle lengthening, qped circles or circles in standing   [employer], PT 01/12/2023, 5:22 PM

## 2023-01-13 NOTE — Progress Notes (Signed)
Synopsis: Referred for emphysema by Panosh, Standley Brooking, MD  Subjective:   PATIENT ID: Laura Curtis GENDER: female DOB: Mar 18, 1946, MRN: [id number]  No chief complaint on file.  77yF with history of covid-19 - 2021 (symptomatically severe but was not hospitalized), positive ppd, never smoker referred for emphysema  She did have RSV clinically diagnosed about 5 weeks ago. Still dealing with cough, productive. DOE with strenuous exertion. She does have some postnasal drainage, not much sinus congestion currently. Prednisone did make her feel more energetic, possibly helped with DOE, cough. She uses albuterol 1-2x per day. Doesn't help currently, did before.   Otherwise pertinent review of systems is negative.  She has no family history of lung disease  She is a never smoker. She was a Animal nutritionist. No cooking over a woodstove. Did have a woodstove for heat in [place] for about 7 years. No vaping or marijuana. From [place] originally, has lived in [place], [place], [place] variously.   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   Hx of varicella    HX: benign breast biopsy    Left rotator cuff tear 01/14/2012   Supraspinatus tear noted on ultrasound. Provided instructions regarding nitroglycerin patches 1/4 patch daily to shoulder.   To continue circular motion exercises and range of motion. No over the head exercises.  No weight bearing on that side. She can continue to play tennis as tolerated.  FU in 2 weeks if no improvement, 4 weeks if improved.       Shoulder pain, left 12/31/2011    History of rotator cuff tear on the right and now has nontraumatic left shoulder pain    Syncope    June, 2013   Uterine prolapse      Family History  Problem Relation Age of Onset   Other Mother        old age died 38    Deep vein thrombosis Mother    Stroke Father        age 58   Stomach cancer Neg Hx    Esophageal cancer Neg Hx    Colon cancer Neg Hx      Past Surgical History:  Procedure Laterality Date   APPENDECTOMY  1968   BREAST BIOPSY  1998   MOHS SURGERY     for squamous cell ca left nose   OVARIAN CYST SURGERY  1968   REPLACEMENT TOTAL KNEE Right    ROTATOR CUFF REPAIR  2001   TONSILLECTOMY AND ADENOIDECTOMY     TUBAL LIGATION      Social History   Socioeconomic History   Marital status: Married    Spouse name: Not on file   Number of children: 3   Years of education: Not on file   Highest education level: Not on file  Occupational History   Occupation: radiation therapist  Tobacco Use   Smoking status: Never   Smokeless tobacco: Never  Vaping Use   Vaping Use:  Never used  Substance and Sexual Activity   Alcohol use: Yes    Alcohol/week: 2.0 - 3.0 standard drinks of alcohol    Types: 2 - 3 Standard drinks or equivalent per week    Comment: 5 a day   Drug use: No   Sexual activity: Not Currently    Birth control/protection: None, Surgical    Comment: BTL  Other Topics Concern   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 Sees dentist on a regular basis.    etoh 3 x per week                Social Determinants of Health   Financial Resource Strain: Low Risk  (07/07/2022)   Overall Financial Resource Strain (CARDIA)    Difficulty of Paying Living Expenses: Not hard at all  Food Insecurity: No Food Insecurity (07/07/2022)   Hunger Vital Sign     Worried About Running Out of Food in the Last Year: Never true    Ran Out of Food in the Last Year: Never true  Transportation Needs: No Transportation Needs (07/07/2022)   PRAPARE - Hydrologist (Medical): No    Lack of Transportation (Non-Medical): No  Physical Activity: Sufficiently Active (07/07/2022)   Exercise Vital Sign    Days of Exercise per Week: 7 days    Minutes of Exercise per Session: 90 min  Stress: No Stress Concern Present (07/07/2022)   [place]    Feeling of Stress : Not at all  Social Connections: Unknown (03/26/2022)   Social Connection and Isolation Panel [NHANES]    Frequency of Communication with Friends and Family: More than three times a week    Frequency of Social Gatherings with Friends and Family: More than three times a week    Attends Religious Services: Patient declined    Marine scientist or Organizations: Patient declined    Attends Music therapist: Not on file    Marital Status: Married  Human resources officer Violence: Not on file     Allergies  Allergen Reactions   Hydrocodone Other (See Comments), Nausea Only and Nausea And Vomiting     Outpatient Medications Prior to Visit  Medication Sig Dispense Refill   albuterol (VENTOLIN HFA) 108 (90 Base) MCG/ACT inhaler Inhale 2 puffs into the lungs every 6 (six) hours as needed for wheezing or shortness of breath. 8 g 0   estradiol (ESTRACE) 0.1 MG/GM vaginal cream 1 gram vaginally twice weekly 42.5 g 3   meloxicam (MOBIC) 7.5 MG tablet Take 1 tablet (7.5 mg total) by mouth daily. May increase to twice a day  if needed 60 tablet 3   No facility-administered medications prior to visit.       Objective:   Physical Exam:  General appearance: 77 y.o., female, NAD, conversant  Eyes: anicteric sclerae; PERRL, tracking appropriately HENT: NCAT; MMM Neck: Trachea midline; no lymphadenopathy,  no JVD Lungs: CTAB, no crackles, no wheeze, with normal respiratory effort CV: RRR, no murmur  Abdomen: Soft, non-tender; non-distended, BS present  Extremities: No peripheral edema, warm Skin: Normal turgor and texture; no rash Psych: Appropriate affect Neuro:  Alert and oriented to person and place, no focal deficit     Vitals:   01/16/23 0943  BP: 114/68  Pulse: 63  Temp: (!) 97.5 F (36.4 C)  TempSrc: Oral  SpO2: 97%  Weight: 146 lb (66.2 kg)  Height: 5\' 4"  (1.626 m)   97% on RA BMI Readings from Last 3 Encounters:  01/16/23 25.06 kg/m  01/02/23 25.60 kg/m  12/31/22 25.70 kg/m   Wt Readings from Last 3 Encounters:  01/16/23 146 lb (66.2 kg)  01/02/23 146 lb 12.8 oz (66.6 kg)  12/31/22 147 lb 6.4 oz (66.9 kg)     CBC    Component Value Date/Time   WBC 5.9 06/05/2022 0841   RBC 4.55 06/05/2022 0841   HGB 13.7 06/05/2022 0841   HCT 40.2 06/05/2022 0841   PLT 224.0 06/05/2022 0841   MCV 88.3 06/05/2022 0841   MCV 89.1 04/15/2012 1729   MCH 29.4 04/15/2012 1729   MCHC 34.2 06/05/2022 0841   RDW 13.4 06/05/2022 0841   LYMPHSABS 1.2 06/05/2022 0841   MONOABS 0.4 06/05/2022 0841   EOSABS 0.1 06/05/2022 0841   BASOSABS 0.0 06/05/2022 0841    Eos 100  Chest Imaging: CXR 12/31/22 borderline hyperinflation  Pulmonary Functions Testing Results:     No data to display              Assessment & Plan:   # Subacute cough - has had delayed recovery from viral URIs/LRTIs particularly since covid-19 infection. Also sounds like she has lingering PND.  # Abnormal CXR - borderline hyperinflation  Plan:  - lab today - check quantiferon for latent tuberculosis - can try albuterol 1-2 puffs 5- 20 minutes before exercise to see if this helps - would take shower, clear nose of crusting and then flonase 1-2 sprays each nostril daily till next visit - I will let you know once I hear from pharmacy about which LABA/ICS asthma maintenance inhaler is preferred by your  insurer - PFTs (breathing test) next visit. If you do start asthma maintenance inhaler, would try to stop it 3 weeks before the PFTs.     Laura Hurter, MD [facility] 01/16/2023 12:03 PM

## 2023-01-16 ENCOUNTER — Encounter: Payer: Self-pay | Admitting: Student

## 2023-01-16 ENCOUNTER — Other Ambulatory Visit (HOSPITAL_COMMUNITY): Payer: Self-pay

## 2023-01-16 ENCOUNTER — Ambulatory Visit: Payer: Medicare HMO | Admitting: Student

## 2023-01-16 ENCOUNTER — Telehealth: Payer: Self-pay | Admitting: Student

## 2023-01-16 VITALS — BP 114/68 | HR 63 | Temp 97.5°F | Ht 64.0 in | Wt 146.0 lb

## 2023-01-16 DIAGNOSIS — R052 Subacute cough: Secondary | ICD-10-CM | POA: Diagnosis not present

## 2023-01-16 DIAGNOSIS — R7611 Nonspecific reaction to tuberculin skin test without active tuberculosis: Secondary | ICD-10-CM

## 2023-01-16 MED ORDER — FLUTICASONE PROPIONATE 50 MCG/ACT NA SUSP: 1.0000 | Freq: Every day | NASAL | 11 refills | Status: DC

## 2023-01-16 MED ORDER — FLUTICASONE-SALMETEROL 250-50 MCG/ACT IN AEPB: 1.0000 | INHALATION_SPRAY | Freq: Two times a day (BID) | RESPIRATORY_TRACT | 11 refills | Status: DC

## 2023-01-16 NOTE — Patient Instructions (Addendum)
-   lab today - check quantiferon for latent tuberculosis - can try albuterol 1-2 puffs 5- 20 minutes before exercise to see if this helps - would take shower, clear nose of crusting and then flonase 1-2 sprays each nostril daily till next visit - I will let you know once I hear from pharmacy about which LABA/ICS asthma maintenance inhaler is preferred by your insurer - PFTs (breathing test) next visit. If you do start asthma maintenance inhaler, would try to stop it 3 weeks before the PFTs.

## 2023-01-16 NOTE — Telephone Encounter (Signed)
Per test claims generic Advair Diskus is the least expensive option at this time for $10.00.

## 2023-01-16 NOTE — Telephone Encounter (Signed)
What is least expensive laba/ics for her?  Thanks! 

## 2023-01-19 DIAGNOSIS — R69 Illness, unspecified: Secondary | ICD-10-CM | POA: Diagnosis not present

## 2023-01-19 LAB — QUANTIFERON-TB GOLD PLUS
Mitogen-NIL: 3.88 IU/mL
NIL: 0.02 IU/mL
QuantiFERON-TB Gold Plus: NEGATIVE
TB1-NIL: 0.01 IU/mL
TB2-NIL: 0.03 IU/mL

## 2023-01-20 ENCOUNTER — Ambulatory Visit: Payer: Medicare HMO | Admitting: Obstetrics and Gynecology

## 2023-01-23 DIAGNOSIS — R69 Illness, unspecified: Secondary | ICD-10-CM | POA: Diagnosis not present

## 2023-01-29 ENCOUNTER — Encounter: Payer: Self-pay | Admitting: Physical Therapy

## 2023-02-05 ENCOUNTER — Telehealth: Payer: Self-pay | Admitting: Internal Medicine

## 2023-02-05 DIAGNOSIS — M180 Bilateral primary osteoarthritis of first carpometacarpal joints: Secondary | ICD-10-CM

## 2023-02-05 MED ORDER — MELOXICAM 7.5 MG PO TABS: 7.5000 mg | ORAL_TABLET | Freq: Every day | ORAL | 3 refills | Status: DC

## 2023-02-05 NOTE — Telephone Encounter (Signed)
Prescription Request  02/05/2023  LOV: 06/04/2022  What is the name of the medication or equipment?  meloxicam (MOBIC) 7.5 MG tablet   Pt is requesting 60 tablets.  Have you contacted your pharmacy to request a refill? Yes   Which pharmacy would you like this sent to?  [facility] [place], [place] - [address] [place] [address] Phone: [phone] Fax: [phone]    Patient notified that their request is being sent to the clinical staff for review and that they should receive a response within 2 business days.   Please advise at Mobile [phone] (mobile)

## 2023-02-05 NOTE — Telephone Encounter (Signed)
Rx sent 

## 2023-02-16 DIAGNOSIS — R69 Illness, unspecified: Secondary | ICD-10-CM | POA: Diagnosis not present

## 2023-02-26 ENCOUNTER — Ambulatory Visit: Payer: Medicare HMO | Admitting: Physical Therapy

## 2023-03-05 ENCOUNTER — Ambulatory Visit: Payer: Medicare HMO | Admitting: Physical Therapy

## 2023-03-05 NOTE — Progress Notes (Signed)
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 Still dealing with cough, productive. DOE with strenuous exertion. She does have some postnasal drainage, not much sinus congestion currently. Prednisone did make her feel more energetic, possibly helped with DOE, cough. She uses albuterol 1-2x per day. Doesn't help currently, did before.   She has no family history of lung disease  She is a never smoker. 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 Did have a woodstove for heat in [place] for about 7 years. No vaping or marijuana. From [place] originally, has lived in [place], [place], [place] variously.   Interval HPI Quantiferon neg, PFTs  No cough.  Never filled prescription for advair.   Feeling overall back to baseline.   Otherwise pertinent review of systems is negative.  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   Hx of varicella    HX: benign breast biopsy    Left rotator cuff tear 01/14/2012   Supraspinatus tear noted on ultrasound. Provided instructions regarding nitroglycerin patches 1/4 patch daily to shoulder.   To continue circular motion exercises and range of motion. No over the head exercises.  No weight  bearing on that side. She can continue to play tennis as tolerated.  FU in 2 weeks if no improvement, 4 weeks if improved.       Shoulder pain, left 12/31/2011   History of rotator cuff tear on the right and now has nontraumatic left shoulder pain    Syncope    June, 2013   Uterine prolapse      Family History  Problem Relation Age of Onset   Other Mother        old age died 30    Deep vein thrombosis Mother    Stroke Father        age 9   Stomach cancer Neg Hx    Esophageal cancer Neg Hx    Colon cancer Neg Hx      Past Surgical History:  Procedure Laterality Date   APPENDECTOMY  1968   BREAST BIOPSY  1998   MOHS SURGERY     for squamous cell ca left nose   OVARIAN CYST SURGERY  1968   REPLACEMENT TOTAL KNEE Right    ROTATOR CUFF REPAIR  2001   TONSILLECTOMY AND ADENOIDECTOMY     TUBAL LIGATION      Social History   Socioeconomic History   Marital status: Married    Spouse name: Not on file   Number of children: 3   Years of education: Not on file   Highest education level: Not on file  Occupational History   Occupation: radiation therapist  Tobacco Use   Smoking status: Never   Smokeless tobacco: Never  Vaping Use   Vaping Use: Never used  Substance and Sexual Activity   Alcohol use: Yes    Alcohol/week: 2.0 - 3.0 standard drinks of alcohol    Types: 2 - 3 Standard drinks or equivalent per week    Comment: 5 a day   Drug use: No   Sexual activity: Not Currently    Birth control/protection: None, Surgical    Comment: BTL  Other Topics Concern   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 Sees dentist on a regular basis.    etoh 3 x per week                Social Determinants of Health   Financial Resource Strain: Low Risk  (07/07/2022)   Overall Financial  Resource Strain (CARDIA)    Difficulty of Paying Living Expenses: Not hard at all  Food Insecurity: No Food Insecurity (07/07/2022)   Hunger Vital Sign    Worried About Running Out of Food in the Last Year: Never true    Ran Out of Food in the Last Year: Never true  Transportation Needs: No Transportation Needs (07/07/2022)   PRAPARE - Administrator, Civil Service (Medical): No    Lack of Transportation (Non-Medical): No  Physical Activity: Sufficiently Active (07/07/2022)   Exercise Vital Sign    Days of Exercise per Week: 7 days    Minutes of Exercise per Session: 90 min  Stress: No Stress Concern Present (07/07/2022)   [employer] of Occupational Health - Occupational Stress Questionnaire    Feeling of Stress : Not at all  Social Connections: Unknown (03/26/2022)   Social Connection and Isolation Panel [NHANES]    Frequency of Communication with Friends and Family: More than three times a week    Frequency of Social Gatherings with Friends and Family: More than three times a week    Attends Religious Services: Patient declined    Database administrator or Organizations: Patient declined    Attends Engineer, structural: Not on file    Marital Status: Married  Catering manager Violence: Not on file     Allergies  Allergen Reactions   Hydrocodone Other (See Comments), Nausea Only and Nausea And Vomiting     Outpatient Medications Prior to Visit  Medication Sig Dispense Refill   estradiol (ESTRACE) 0.1 MG/GM vaginal cream 1 gram vaginally twice weekly 42.5 g 3   fluticasone (FLONASE) 50 MCG/ACT nasal spray Place 1 spray into both nostrils daily. 16 g 11   meloxicam (MOBIC) 7.5 MG tablet Take 1 tablet (7.5 mg total) by mouth daily. May increase to twice a day  if needed 60 tablet 3   albuterol (VENTOLIN HFA) 108 (90 Base) MCG/ACT inhaler Inhale 2 puffs into the lungs every 6 (six) hours as needed for wheezing or shortness of breath. (Patient not taking:  Reported on 03/09/2023) 8 g 0   fluticasone-salmeterol (ADVAIR) 250-50 MCG/ACT AEPB Inhale 1 puff into the lungs every 12 (twelve) hours. (Patient not taking: Reported on 03/09/2023) 60 each 11   No facility-administered medications  prior to visit.       Objective:   Physical Exam:  General appearance: 77 y.o., female, NAD, conversant  Eyes: anicteric sclerae; PERRL, tracking appropriately HENT: NCAT; MMM Neck: Trachea midline; no lymphadenopathy, no JVD Lungs: CTAB, no crackles, no wheeze, with normal respiratory effort CV: RRR, no murmur  Abdomen: Soft, non-tender; non-distended, BS present  Extremities: No peripheral edema, warm Skin: Normal turgor and texture; no rash Psych: Appropriate affect Neuro: Alert and oriented to person and place, no focal deficit     Vitals:   03/09/23 1523  BP: 118/72  Pulse: 65  SpO2: 95%  Weight: 143 lb 9.6 oz (65.1 kg)  Height: 5\' 4"  (1.626 m)    95% on RA BMI Readings from Last 3 Encounters:  03/09/23 24.65 kg/m  03/06/23 24.67 kg/m  01/16/23 25.06 kg/m   Wt Readings from Last 3 Encounters:  03/09/23 143 lb 9.6 oz (65.1 kg)  03/06/23 146 lb (66.2 kg)  01/16/23 146 lb (66.2 kg)     CBC    Component Value Date/Time   WBC 5.9 06/05/2022 0841   RBC 4.55 06/05/2022 0841   HGB 13.7 06/05/2022 0841   HCT 40.2 06/05/2022 0841   PLT 224.0 06/05/2022 0841   MCV 88.3 06/05/2022 0841   MCV 89.1 04/15/2012 1729   MCH 29.4 04/15/2012 1729   MCHC 34.2 06/05/2022 0841   RDW 13.4 06/05/2022 0841   LYMPHSABS 1.2 06/05/2022 0841   MONOABS 0.4 06/05/2022 0841   EOSABS 0.1 06/05/2022 0841   BASOSABS 0.0 06/05/2022 0841    Eos 100  Chest Imaging: CXR 12/31/22 borderline hyperinflation  Pulmonary Functions Testing Results:     No data to display              Assessment & Plan:   # Subacute cough - has had delayed recovery from viral URIs/LRTIs particularly since covid-19 infection. Also sounded like she had lingering  PND. This has resolved. # Abnormal CXR - borderline hyperinflation  Plan: - RSV vaccination in late September or October - would be glad to see her again in clinic in future if cough returns    Omar Person, MD [facility] 03/09/2023 3:48 PM

## 2023-03-06 ENCOUNTER — Ambulatory Visit (INDEPENDENT_AMBULATORY_CARE_PROVIDER_SITE_OTHER): Payer: Medicare HMO | Admitting: Student

## 2023-03-06 DIAGNOSIS — R052 Subacute cough: Secondary | ICD-10-CM | POA: Diagnosis not present

## 2023-03-06 NOTE — Patient Instructions (Signed)
Full PFT Performed Today  

## 2023-03-06 NOTE — Progress Notes (Signed)
Full PFT Performed Today  

## 2023-03-09 ENCOUNTER — Encounter: Payer: Self-pay | Admitting: Student

## 2023-03-09 ENCOUNTER — Ambulatory Visit: Payer: Medicare HMO | Admitting: Student

## 2023-03-09 VITALS — BP 118/72 | HR 65 | Ht 64.0 in | Wt 143.6 lb

## 2023-03-09 DIAGNOSIS — R052 Subacute cough: Secondary | ICD-10-CM | POA: Diagnosis not present

## 2023-03-09 NOTE — Patient Instructions (Signed)
-   RSV vaccination in late September or October

## 2023-03-10 ENCOUNTER — Encounter: Payer: Medicare HMO | Admitting: Physical Therapy

## 2023-03-17 DIAGNOSIS — R69 Illness, unspecified: Secondary | ICD-10-CM | POA: Diagnosis not present

## 2023-03-24 LAB — PULMONARY FUNCTION TEST
DL/VA % pred: 95 %
DL/VA: 3.86 ml/min/mmHg/L
DLCO cor % pred: 84 %
DLCO cor: 16.17 ml/min/mmHg
DLCO unc % pred: 84 %
DLCO unc: 16.17 ml/min/mmHg
FEF 25-75 Post: 2.54 L/sec
FEF 25-75 Pre: 1.83 L/sec
FEF2575-%Change-Post: 39 %
FEF2575-%Pred-Post: 170 %
FEF2575-%Pred-Pre: 122 %
FEV1-%Change-Post: 6 %
FEV1-%Pred-Post: 95 %
FEV1-%Pred-Pre: 89 %
FEV1-Post: 1.93 L
FEV1-Pre: 1.81 L
FEV1FVC-%Change-Post: 6 %
FEV1FVC-%Pred-Pre: 108 %
FEV6-%Change-Post: 0 %
FEV6-%Pred-Post: 87 %
FEV6-%Pred-Pre: 87 %
FEV6-Post: 2.24 L
FEV6-Pre: 2.25 L
FEV6FVC-%Pred-Post: 105 %
FEV6FVC-%Pred-Pre: 105 %
FVC-%Change-Post: 0 %
FVC-%Pred-Post: 82 %
FVC-%Pred-Pre: 83 %
FVC-Post: 2.24 L
FVC-Pre: 2.25 L
Post FEV1/FVC ratio: 86 %
Post FEV6/FVC ratio: 100 %
Pre FEV1/FVC ratio: 80 %
Pre FEV6/FVC Ratio: 100 %
RV % pred: 125 %
RV: 3.01 L
TLC % pred: 108 %
TLC: 5.57 L

## 2023-03-26 ENCOUNTER — Encounter: Payer: Medicare HMO | Admitting: Physical Therapy

## 2023-03-30 ENCOUNTER — Ambulatory Visit (HOSPITAL_BASED_OUTPATIENT_CLINIC_OR_DEPARTMENT_OTHER): Payer: Medicare HMO

## 2023-03-31 ENCOUNTER — Ambulatory Visit (INDEPENDENT_AMBULATORY_CARE_PROVIDER_SITE_OTHER): Payer: Medicare HMO | Admitting: Family Medicine

## 2023-03-31 ENCOUNTER — Ambulatory Visit (INDEPENDENT_AMBULATORY_CARE_PROVIDER_SITE_OTHER): Payer: Medicare HMO

## 2023-03-31 ENCOUNTER — Encounter (HOSPITAL_BASED_OUTPATIENT_CLINIC_OR_DEPARTMENT_OTHER): Payer: Self-pay

## 2023-03-31 ENCOUNTER — Encounter: Payer: Self-pay | Admitting: Family Medicine

## 2023-03-31 VITALS — BP 114/42 | HR 61 | Ht 64.5 in | Wt 147.4 lb

## 2023-03-31 VITALS — BP 110/62 | HR 58 | Temp 98.0°F | Wt 146.4 lb

## 2023-03-31 DIAGNOSIS — R42 Dizziness and giddiness: Secondary | ICD-10-CM

## 2023-03-31 DIAGNOSIS — R300 Dysuria: Secondary | ICD-10-CM | POA: Diagnosis not present

## 2023-03-31 DIAGNOSIS — R69 Illness, unspecified: Secondary | ICD-10-CM | POA: Diagnosis not present

## 2023-03-31 LAB — BASIC METABOLIC PANEL
BUN: 17 mg/dL (ref 6–23)
CO2: 26 mEq/L (ref 19–32)
Calcium: 9.1 mg/dL (ref 8.4–10.5)
Chloride: 106 mEq/L (ref 96–112)
Creatinine, Ser: 0.72 mg/dL (ref 0.40–1.20)
GFR: 79.58 mL/min (ref >60.00)
Glucose, Bld: 88 mg/dL (ref 70–99)
Potassium: 4.3 mEq/L (ref 3.5–5.1)
Sodium: 139 mEq/L (ref 135–145)

## 2023-03-31 LAB — POCT URINALYSIS DIPSTICK
Bilirubin, UA: NEGATIVE
Glucose, UA: NEGATIVE
Ketones, UA: NEGATIVE
Nitrite, UA: NEGATIVE
Protein, UA: NEGATIVE
Spec Grav, UA: 1.02 (ref 1.010–1.025)
Urobilinogen, UA: 0.2 E.U./dL
pH, UA: 5.5 (ref 5.0–8.0)

## 2023-03-31 LAB — CBC WITH DIFFERENTIAL/PLATELET
Basophils Absolute: 0 10*3/uL (ref 0.0–0.1)
Basophils Relative: 0.4 % (ref 0.0–3.0)
Eosinophils Absolute: 0.1 10*3/uL (ref 0.0–0.7)
Eosinophils Relative: 1.1 % (ref 0.0–5.0)
HCT: 41 % (ref 36.0–46.0)
Hemoglobin: 13.8 g/dL (ref 12.0–15.0)
Lymphocytes Relative: 19 % (ref 12.0–46.0)
Lymphs Abs: 1.4 10*3/uL (ref 0.7–4.0)
MCHC: 33.6 g/dL (ref 30.0–36.0)
MCV: 89.3 fl (ref 78.0–100.0)
Monocytes Absolute: 0.6 10*3/uL (ref 0.1–1.0)
Monocytes Relative: 7.8 % (ref 3.0–12.0)
Neutro Abs: 5.2 10*3/uL (ref 1.4–7.7)
Neutrophils Relative %: 71.7 % (ref 43.0–77.0)
Platelets: 219 10*3/uL (ref 150.0–400.0)
RBC: 4.59 Mil/uL (ref 3.87–5.11)
RDW: 13.3 % (ref 11.5–15.5)
WBC: 7.2 10*3/uL (ref 4.0–10.5)

## 2023-03-31 LAB — HEPATIC FUNCTION PANEL
ALT: 15 U/L (ref 0–35)
AST: 16 U/L (ref 0–37)
Albumin: 4 g/dL (ref 3.5–5.2)
Alkaline Phosphatase: 75 U/L (ref 39–117)
Bilirubin, Direct: 0.1 mg/dL (ref 0.0–0.3)
Total Bilirubin: 0.4 mg/dL (ref 0.2–1.2)
Total Protein: 6.4 g/dL (ref 6.0–8.3)

## 2023-03-31 LAB — TSH: TSH: 4.25 u[IU]/mL (ref 0.35–5.50)

## 2023-03-31 MED ORDER — NITROFURANTOIN MONOHYD MACRO 100 MG PO CAPS
100.0000 mg | ORAL_CAPSULE | Freq: Two times a day (BID) | ORAL | 0 refills | Status: DC
Start: 2023-03-31 — End: 2023-04-06

## 2023-03-31 MED ORDER — PHENAZOPYRIDINE HCL 200 MG PO TABS: 200.0000 mg | ORAL_TABLET | Freq: Three times a day (TID) | ORAL | 0 refills | Status: DC | PRN

## 2023-03-31 NOTE — Progress Notes (Signed)
   Subjective:    Patient ID: Laura Curtis, female    DOB: [birth year], 77 y.o.   MRN: [id number]  HPI Here for 3 episodes of lightheadedness over the past month. Each time happened while she was playing pickle ball. She is quite active. Each time she started feeling a little "out of it" and she had trouble focusing on the ball. Each time she sat down, and she felt back to normal within 15 minutes. She has had vertigo before, and she says these episodes felt very different. No SOB or chest pain or palpitations. She always eats breakfast, and she tries to stay hydrated. She recently had PFT's due to a cough, and these were normal. The cough has resolved.    Review of Systems  Constitutional: Negative.   Respiratory: Negative.    Cardiovascular: Negative.   Gastrointestinal: Negative.   Genitourinary: Negative.   Neurological:  Positive for light-headedness. Negative for dizziness, syncope and headaches.       Objective:   Physical Exam Constitutional:      Appearance: Normal appearance.  Cardiovascular:     Rate and Rhythm: Normal rate and regular rhythm.     Pulses: Normal pulses.     Heart sounds: Normal heart sounds.  Pulmonary:     Effort: Pulmonary effort is normal.     Breath sounds: Normal breath sounds.  Musculoskeletal:     Right lower leg: No edema.     Left lower leg: No edema.  Neurological:     General: No focal deficit present.     Mental Status: She is alert and oriented to person, place, and time.           Assessment & Plan:  Episodic lightheadedness, most likely due to dehydration and hypotension. She will make an effort to hydrate both before and during exercise. We will get labs today to rule out anemia, etc. We will set up an ECHO. Gershon Crane, MD

## 2023-03-31 NOTE — Progress Notes (Signed)
Patient came in today with complaints of vaginal irritation and discomfort when urinating. Patient gave a urine sample to be evaluated and sent out. Per protocol, Macrobid 500mg  twice a day X5 days and Pyridium 200mg  three times daily X2 days was sent into the requested pharamacy. tbw

## 2023-04-04 LAB — URINE CULTURE

## 2023-04-06 ENCOUNTER — Encounter: Payer: Self-pay | Admitting: Family Medicine

## 2023-04-06 ENCOUNTER — Ambulatory Visit (INDEPENDENT_AMBULATORY_CARE_PROVIDER_SITE_OTHER): Payer: Medicare HMO | Admitting: Family Medicine

## 2023-04-06 VITALS — BP 110/58 | HR 76 | Temp 97.9°F | Wt 146.0 lb

## 2023-04-06 DIAGNOSIS — Z87898 Personal history of other specified conditions: Secondary | ICD-10-CM | POA: Diagnosis not present

## 2023-04-06 DIAGNOSIS — M47812 Spondylosis without myelopathy or radiculopathy, cervical region: Secondary | ICD-10-CM | POA: Diagnosis not present

## 2023-04-06 DIAGNOSIS — Z1211 Encounter for screening for malignant neoplasm of colon: Secondary | ICD-10-CM

## 2023-04-06 DIAGNOSIS — R42 Dizziness and giddiness: Secondary | ICD-10-CM

## 2023-04-06 DIAGNOSIS — M180 Bilateral primary osteoarthritis of first carpometacarpal joints: Secondary | ICD-10-CM

## 2023-04-06 NOTE — Progress Notes (Signed)
   Subjective:    Patient ID: Laura Curtis, female    DOB: 04/20/1946, 77 y.o.   MRN: [id number]  HPI Here for an intro visit after switching her primary care from Dr. Fabian Sharp. She is basically quite healthy, and she enjoys playing pickle ball. We saw her on 03-31-23 for 3 episodes of lightheadedness when she was playing sports. These were felt to be from dehydration, and we advised her to hydrate well both before and during exercise. She has not had any more spells since then. She had labs drawn that were normal. She is scheduled for an ECHO tomorrow. She sees Dr. Valentina Shaggy for GYN care. She stopped getting mammograms many years ago. She has never had a colonoscopy.    Review of Systems  Constitutional: Negative.   Respiratory: Negative.    Cardiovascular: Negative.   Gastrointestinal: Negative.   Genitourinary: Negative.   Neurological: Negative.   Psychiatric/Behavioral: Negative.         Objective:   Physical Exam Constitutional:      Appearance: Normal appearance.  Cardiovascular:     Rate and Rhythm: Normal rate and regular rhythm.     Pulses: Normal pulses.     Heart sounds: Normal heart sounds.  Pulmonary:     Effort: Pulmonary effort is normal.     Breath sounds: Normal breath sounds.  Neurological:     General: No focal deficit present.     Mental Status: She is alert and oriented to person, place, and time.           Assessment & Plan:  Intro visit with this fairly healthy patient. She will have the ECHO tomorrow to finish our workup of the lightheaded spells. We will order a Cologuard test (she has never had one).  Gershon Crane, MD

## 2023-04-07 ENCOUNTER — Ambulatory Visit (HOSPITAL_COMMUNITY): Payer: Medicare HMO | Attending: Cardiology

## 2023-04-07 DIAGNOSIS — I081 Rheumatic disorders of both mitral and tricuspid valves: Secondary | ICD-10-CM | POA: Diagnosis not present

## 2023-04-07 DIAGNOSIS — R42 Dizziness and giddiness: Secondary | ICD-10-CM | POA: Diagnosis not present

## 2023-04-07 DIAGNOSIS — R55 Syncope and collapse: Secondary | ICD-10-CM | POA: Diagnosis not present

## 2023-04-07 LAB — ECHOCARDIOGRAM COMPLETE
Area-P 1/2: 2.42 cm2
S' Lateral: 2.6 cm

## 2023-04-09 ENCOUNTER — Encounter (HOSPITAL_BASED_OUTPATIENT_CLINIC_OR_DEPARTMENT_OTHER): Payer: Self-pay

## 2023-04-09 ENCOUNTER — Ambulatory Visit (INDEPENDENT_AMBULATORY_CARE_PROVIDER_SITE_OTHER): Payer: Medicare HMO

## 2023-04-09 ENCOUNTER — Other Ambulatory Visit (HOSPITAL_BASED_OUTPATIENT_CLINIC_OR_DEPARTMENT_OTHER): Payer: Self-pay | Admitting: Obstetrics & Gynecology

## 2023-04-09 ENCOUNTER — Encounter (HOSPITAL_BASED_OUTPATIENT_CLINIC_OR_DEPARTMENT_OTHER): Payer: Self-pay | Admitting: Obstetrics & Gynecology

## 2023-04-09 VITALS — BP 107/54 | HR 66 | Ht 64.5 in | Wt 147.0 lb

## 2023-04-09 DIAGNOSIS — R300 Dysuria: Secondary | ICD-10-CM | POA: Diagnosis not present

## 2023-04-09 DIAGNOSIS — N3090 Cystitis, unspecified without hematuria: Secondary | ICD-10-CM

## 2023-04-09 DIAGNOSIS — R82998 Other abnormal findings in urine: Secondary | ICD-10-CM

## 2023-04-09 LAB — POCT URINALYSIS DIPSTICK
Bilirubin, UA: NEGATIVE
Glucose, UA: NEGATIVE
Ketones, UA: NEGATIVE
Nitrite, UA: NEGATIVE
Protein, UA: NEGATIVE
Spec Grav, UA: 1.015 (ref 1.010–1.025)
Urobilinogen, UA: 0.2 E.U./dL
pH, UA: 5 (ref 5.0–8.0)

## 2023-04-09 MED ORDER — SULFAMETHOXAZOLE-TRIMETHOPRIM 800-160 MG PO TABS
1.0000 | ORAL_TABLET | Freq: Two times a day (BID) | ORAL | 0 refills | Status: DC
Start: 2023-04-09 — End: 2023-08-27

## 2023-04-09 NOTE — Progress Notes (Signed)
Patient is here today with symptoms of UTI. Patient states she has some slight discomfort when urinating. Urine sample collected and set for evaluation. tbw

## 2023-04-13 DIAGNOSIS — Z1211 Encounter for screening for malignant neoplasm of colon: Secondary | ICD-10-CM | POA: Diagnosis not present

## 2023-04-15 LAB — URINE CULTURE

## 2023-04-17 ENCOUNTER — Other Ambulatory Visit (HOSPITAL_BASED_OUTPATIENT_CLINIC_OR_DEPARTMENT_OTHER): Payer: Self-pay | Admitting: Obstetrics & Gynecology

## 2023-04-17 DIAGNOSIS — N3000 Acute cystitis without hematuria: Secondary | ICD-10-CM

## 2023-04-17 MED ORDER — NITROFURANTOIN MONOHYD MACRO 100 MG PO CAPS
100.0000 mg | ORAL_CAPSULE | Freq: Two times a day (BID) | ORAL | 0 refills | Status: DC
Start: 2023-04-17 — End: 2023-08-27

## 2023-04-17 NOTE — Addendum Note (Signed)
Addended by: Jerene Bears on: 04/17/2023 01:50 PM   Modules accepted: Orders

## 2023-04-18 LAB — COLOGUARD: COLOGUARD: NEGATIVE

## 2023-04-21 DIAGNOSIS — R69 Illness, unspecified: Secondary | ICD-10-CM | POA: Diagnosis not present

## 2023-04-28 DIAGNOSIS — L570 Actinic keratosis: Secondary | ICD-10-CM | POA: Diagnosis not present

## 2023-04-28 DIAGNOSIS — L603 Nail dystrophy: Secondary | ICD-10-CM | POA: Diagnosis not present

## 2023-04-28 DIAGNOSIS — L821 Other seborrheic keratosis: Secondary | ICD-10-CM | POA: Diagnosis not present

## 2023-04-29 ENCOUNTER — Other Ambulatory Visit (HOSPITAL_BASED_OUTPATIENT_CLINIC_OR_DEPARTMENT_OTHER): Payer: Medicare HMO

## 2023-04-29 DIAGNOSIS — N3090 Cystitis, unspecified without hematuria: Secondary | ICD-10-CM | POA: Diagnosis not present

## 2023-05-01 LAB — URINE CULTURE

## 2023-05-05 ENCOUNTER — Encounter: Payer: Self-pay | Admitting: Physical Therapy

## 2023-05-05 ENCOUNTER — Ambulatory Visit: Payer: Medicare HMO | Attending: Obstetrics and Gynecology | Admitting: Physical Therapy

## 2023-05-05 DIAGNOSIS — R279 Unspecified lack of coordination: Secondary | ICD-10-CM | POA: Insufficient documentation

## 2023-05-05 DIAGNOSIS — M6281 Muscle weakness (generalized): Secondary | ICD-10-CM | POA: Diagnosis not present

## 2023-05-05 DIAGNOSIS — R293 Abnormal posture: Secondary | ICD-10-CM | POA: Insufficient documentation

## 2023-05-05 NOTE — Therapy (Addendum)
OUTPATIENT PHYSICAL THERAPY FEMALE PELVIC Treatment   Patient Name: Laura Curtis MRN: [id number] DOB:[birth year], 77 y.o., female Today's Date: 05/05/2023  END OF SESSION:  PT End of Session - 05/05/23 1622     Visit Number 1    Date for PT Re-Evaluation 07/28/23    PT Start Time 1617    PT Stop Time 1657    PT Time Calculation (min) 40 min    Activity Tolerance Patient tolerated treatment well    Behavior During Therapy [facility] for tasks assessed/performed              Past Medical History:  Diagnosis Date   Arthritis    Biceps tendinopathy 01/14/2012   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   Hx of varicella    HX: benign breast biopsy    Left rotator cuff tear 01/14/2012   Supraspinatus tear noted on ultrasound. Provided instructions regarding nitroglycerin patches 1/4 patch daily to shoulder.   To continue circular motion exercises and range of motion. No over the head exercises.  No weight bearing on that side. She can continue to play tennis as tolerated.  FU in 2 weeks if no improvement, 4 weeks if improved.       Shoulder pain, left 12/31/2011   History of rotator cuff tear on the right and now has nontraumatic left shoulder pain    Syncope    June, 2013   Uterine prolapse    Past Surgical History:  Procedure Laterality Date   APPENDECTOMY  1968   BREAST BIOPSY  1998   MOHS SURGERY     for squamous cell ca left nose   OVARIAN CYST SURGERY  1968   REPLACEMENT TOTAL KNEE Right    ROTATOR CUFF REPAIR  2001   TONSILLECTOMY AND ADENOIDECTOMY     TUBAL LIGATION     Patient Active Problem List   Diagnosis Date Noted   Primary osteoarthritis of  both first carpometacarpal joints 10/04/2021   Radial nerve compression, right 10/04/2021   Cervical spine arthritis 10/04/2021   Arthritis of midfoot, left 07/23/2021   S/P TKR (total knee replacement) using cement, right 12/07/2019   Vaginal atrophy 10/06/2019   Incomplete uterine prolapse 10/06/2019   Hearing aid worn    Hx of syncope 07/21/2012   Hx of nonmelanoma skin cancer 07/21/2012   History of positive PPD    HX: benign breast biopsy     PCP: Madelin Headings, MD   REFERRING PROVIDER:   Marguerita Beards, MD    REFERRING DIAG:  R15.9 (ICD-10-CM) - Incontinence of feces, unspecified fecal incontinence type  N32.81 (ICD-10-CM) - Overactive bladder    THERAPY DIAG:  Muscle weakness (generalized)  Abnormal posture  Unspecified lack of coordination  Rationale for Evaluation and Treatment: Rehabilitation  ONSET DATE: slowly over 2 years, after a knee replaced and had to go on antibiotics for 3 months and that changed a lot  SUBJECTIVE:  SUBJECTIVE STATEMENT: Pt feels the urge techniques have helped a lot.  Leakage is mostly non existent.  Pt reports hip/buttock has been hurting though and would like to find out what is gong on with that. Pt pointing to ischial tub on Rt side.  Fluid intake: Yes: 1 tea 12 oz x 4/day    PAIN:  PAIN:  Are you having pain? Yes NPRS scale: 5-6/10 Pain location: Rt hip/buttock Pain orientation: Posterior  PAIN TYPE: sharp and radiating Pain description: intermittent  Aggravating factors: end of the day, sitting Relieving factors: just has been getting better slowly    PRECAUTIONS: None  WEIGHT BEARING RESTRICTIONS: No  FALLS:  Has patient fallen in last 6 months? No  LIVING ENVIRONMENT: Lives with: lives with their spouse Lives in:  House/apartment   OCCUPATION: playing pickle ball until getting sick, books and sewing  PLOF: Independent  PATIENT GOALS: establish some thing to prevent things from getting weaker  PERTINENT HISTORY:  3 vaginal deliveries, RSV, long term antibiotics (3 months) Sexual abuse: No  BOWEL MOVEMENT: Pain with bowel movement: No Type of bowel movement:Type (Bristol Stool Scale) normal to soft, Frequency daily, and Strain No Fully empty rectum: Yes:   Leakage: Yes: but barely have had with gas a very small amount Pads: No Fiber supplement: No  URINATION: Pain with urination: No Fully empty bladder: Yes:   Stream:  cannot feel the stream and unknown if I finished Urgency: Yes:   Frequency: normal Leakage: Coughing (not currently just when sick) Pads: No  INTERCOURSE: Pain with intercourse: not sexually active   PREGNANCY: Vaginal deliveries 3 Tearing Yes: 1st one had stitches   PROLAPSE: None   OBJECTIVE:   DIAGNOSTIC FINDINGS:    PATIENT SURVEYS:    PFIQ-7   COGNITION: Overall cognitive status: Within functional limits for tasks assessed     SENSATION: Light touch:   Proprioception: Appears intact  MUSCLE LENGTH: Hamstrings: normal Thomas test:  7/9 :  LUMBAR SPECIAL TESTS:  ASLR normal  FUNCTIONAL TESTS:  Single leg stand Rt side a little less stable (knee replaced) than Lt; unsteady and 3 sec hold  GAIT:  Comments: WFL   POSTURE: rounded shoulders, increased lumbar lordosis, increased thoracic kyphosis, and anterior pelvic tilt  PELVIC ALIGNMENT: normal LUMBARAROM/PROM:  A/PROM A/PROM  eval  Flexion 80%  Extension   Right lateral flexion   Left lateral flexion   Right rotation   Left rotation    (Blank rows = not tested)  LOWER EXTREMITY ROM:  Passive ROM Right eval Left eval  Hip flexion [facility]   Hip extension    Hip abduction    Hip adduction    Hip internal rotation 50% 50%  Hip external rotation 30% 30%  Knee  flexion    Knee extension    Ankle dorsiflexion    Ankle plantarflexion    Ankle inversion    Ankle eversion     (Blank rows = not tested)  LOWER EXTREMITY MMT:  MMT Right eval Left eval  Hip flexion 4/5   Hip extension    Hip abduction 4/5   Hip adduction    Hip internal rotation    Hip external rotation    Knee flexion    Knee extension    Ankle dorsiflexion    Ankle plantarflexion    Ankle inversion    Ankle eversion    7/9: Rt knee flex, hip abduction, hip ext - 4/5; Lt knee flex, hip ext 5/5; hip  abduction 4/5 PALPATION:   General  adductors, gluteals, lumbar paraspinals tight                External Perineal Exam pale in color                             Internal Pelvic Floor chest breathing and holding breath to engage the pelvic floor  Patient confirms identification and approves PT to assess internal pelvic floor and treatment Yes  PELVIC MMT:   MMT eval  Vaginal 2/5 x 1 for 2 sec hold  Internal Anal Sphincter   External Anal Sphincter   Puborectalis   Diastasis Recti   (Blank rows = not tested)        TONE: High posterior, low tone anterior  PROLAPSE:   TODAY'S TREATMENT:                                                                                                                              DATE: 05/05/23  NMRE:  Educated and performed transversus abdominus activation and kegel with exhale working on [employer] - supine with yoga block 2 ways, and ball with UE in flexion Educated on hip strength for improved stability and how to do exercise with exhale on exertion- hip ext and hip abduction 20x Exercises: Piriformis stretch Hamstring stretch  01/12/23  EVAL and educated on urge and piriformis stretch in sitting   PATIENT EDUCATION:  Education details: urge technique, [id number] Person educated: Patient Education method: Explanation, Demonstration, Verbal cues, and Handouts Education comprehension: verbalized understanding and returned  demonstration  HOME EXERCISE PROGRAM: Access Code: [email or web address] URL: [email or web address] Date: 05/05/2023 Prepared by: Dwana Curd  Exercises - Prone Hip Extension - Two Pillows  - 1 x daily - 7 x weekly - 2 sets - 10 reps - Sidelying Hip Abduction  - 1 x daily - 7 x weekly - 2 sets - 10 reps - Isometric Dead Bug  - 1 x daily - 7 x weekly - 1 sets - 10 reps - Ball squeeze with Kegel  - 3 x daily - 7 x weekly - 1 sets - 10 reps - 3 sec hold  ASSESSMENT:  CLINICAL IMPRESSION: Pt presents today with hip pain in addition to incontinence.  Pt was assessed for hip strength with re-assessment shown above hip weakness and also has some tenderness at hamstring attachment on Rt and around greater trochanter on Rt. Pt was given initial HEP today and will benefit from strength and coordination of breathing for reduced leakage and improved core stability during functional activities.  Pt will benefit from skilled PT to improve coordination for better pressure management and improved ability to prevent leakage or risk of increased prolapse  OBJECTIVE IMPAIRMENTS: decreased coordination, decreased endurance, decreased ROM, decreased strength, increased muscle spasms, impaired flexibility, impaired tone, and postural dysfunction.   ACTIVITY LIMITATIONS: continence and toileting  PARTICIPATION LIMITATIONS: community activity  PERSONAL FACTORS: 1-2 comorbidities: vaginal deliveries, RSV  are also affecting patient's functional outcome.   REHAB POTENTIAL: Excellent  CLINICAL DECISION MAKING: Stable/uncomplicated  EVALUATION COMPLEXITY: Low   GOALS: Goals reviewed with patient? Yes  SHORT TERM GOALS: Target date: 02/09/23  Ind with initial HEP Baseline: Goal status: IN PROGRESS  LONG TERM GOALS: Target date: 04/06/23  Pt will be independent with advanced HEP to maintain improvements made throughout therapy  Baseline:  Goal status: IN PROGRESS  2.  Pt will be able to  functional actions such as coughing for a bout of 30 seconds without leakage  Baseline: not happening only 1-2x in the last 4 months Goal status: IN PROGRESS  3.  Pt will report her BMs are complete due to improved bowel habits and evacuation techniques.  Baseline: empty but takes 2 times Goal status: MET  4.  Pt will report improved sensation of when her bladder is full due to improved soft tissue health of the pelvic floor Baseline:  Goal status: IN PROGRESS    PLAN:  PT FREQUENCY: 1x/week  PT DURATION: 12 weeks  PLANNED INTERVENTIONS: Therapeutic exercises, Therapeutic activity, Neuromuscular re-education, Balance training, Gait training, Patient/Family education, Self Care, Joint mobilization, Dry Needling, Electrical stimulation, Cryotherapy, Moist heat, Taping, Biofeedback, Manual therapy, and Re-evaluation  PLAN FOR NEXT SESSION: f/u on HEP issued last time, work on Film/video editor and posterior muscle lengthening, qped circles or circles in standing   [employer], PT 05/05/2023, 5:09 PM   PHYSICAL THERAPY DISCHARGE SUMMARY  Visits from Start of Care: 1  Current functional level related to goals / functional outcomes: See above for most current PT status.  Pt didn't return to PT.     Remaining deficits: See above for current HEP   Education / Equipment: HEP   Patient agrees to discharge. Patient goals were not met. Patient is being discharged due to not returning since the last visit.  Lorrene Reid, PT 11/29/23 1:50 PM

## 2023-05-09 ENCOUNTER — Emergency Department (HOSPITAL_BASED_OUTPATIENT_CLINIC_OR_DEPARTMENT_OTHER)
Admission: EM | Admit: 2023-05-09 | Discharge: 2023-05-09 | Discharge status: Against Medical Advice | Payer: Medicare HMO | Attending: Emergency Medicine | Admitting: Emergency Medicine

## 2023-05-09 ENCOUNTER — Other Ambulatory Visit: Payer: Self-pay

## 2023-05-09 DIAGNOSIS — W57XXXA Bitten or stung by nonvenomous insect and other nonvenomous arthropods, initial encounter: Secondary | ICD-10-CM | POA: Diagnosis not present

## 2023-05-09 DIAGNOSIS — S50861A Insect bite (nonvenomous) of right forearm, initial encounter: Secondary | ICD-10-CM | POA: Insufficient documentation

## 2023-05-09 DIAGNOSIS — Z5321 Procedure and treatment not carried out due to patient leaving prior to being seen by health care provider: Secondary | ICD-10-CM | POA: Insufficient documentation

## 2023-05-09 NOTE — ED Triage Notes (Signed)
Right forearm stung by bee yesterday morning. Redness spread up forearm despite taking benadryl.

## 2023-05-09 NOTE — ED Notes (Signed)
Called x3 no reply. Registration reports this patient has left.

## 2023-05-10 DIAGNOSIS — T63461A Toxic effect of venom of wasps, accidental (unintentional), initial encounter: Secondary | ICD-10-CM | POA: Diagnosis not present

## 2023-05-10 DIAGNOSIS — R21 Rash and other nonspecific skin eruption: Secondary | ICD-10-CM | POA: Diagnosis not present

## 2023-05-14 ENCOUNTER — Telehealth: Payer: Self-pay | Admitting: *Deleted

## 2023-05-14 NOTE — Telephone Encounter (Signed)
Transition Care Management Unsuccessful Follow-up Telephone Call  Date of discharge and from where:  [facility] 05/09/2023  Attempts:  1st Attempt  Reason for unsuccessful TCM follow-up call:  Left voice message

## 2023-05-15 ENCOUNTER — Telehealth: Payer: Self-pay | Admitting: *Deleted

## 2023-05-15 NOTE — Telephone Encounter (Signed)
Transition Care Management Unsuccessful Follow-up Telephone Call  Date of discharge and from where:  [facility]  05/08/2023  Attempts:  2nd Attempt  Reason for unsuccessful TCM follow-up call:  No answer/busy

## 2023-06-01 DIAGNOSIS — H5212 Myopia, left eye: Secondary | ICD-10-CM | POA: Diagnosis not present

## 2023-06-07 DIAGNOSIS — W57XXXA Bitten or stung by nonvenomous insect and other nonvenomous arthropods, initial encounter: Secondary | ICD-10-CM | POA: Diagnosis not present

## 2023-06-07 DIAGNOSIS — L988 Other specified disorders of the skin and subcutaneous tissue: Secondary | ICD-10-CM | POA: Diagnosis not present

## 2023-06-24 DIAGNOSIS — M25561 Pain in right knee: Secondary | ICD-10-CM | POA: Diagnosis not present

## 2023-06-30 DIAGNOSIS — M25561 Pain in right knee: Secondary | ICD-10-CM | POA: Diagnosis not present

## 2023-07-02 DIAGNOSIS — M25561 Pain in right knee: Secondary | ICD-10-CM | POA: Diagnosis not present

## 2023-07-03 ENCOUNTER — Encounter (HOSPITAL_BASED_OUTPATIENT_CLINIC_OR_DEPARTMENT_OTHER): Payer: Self-pay | Admitting: Obstetrics & Gynecology

## 2023-07-06 ENCOUNTER — Ambulatory Visit (HOSPITAL_BASED_OUTPATIENT_CLINIC_OR_DEPARTMENT_OTHER): Payer: Medicare HMO | Admitting: Obstetrics & Gynecology

## 2023-07-06 DIAGNOSIS — M25561 Pain in right knee: Secondary | ICD-10-CM | POA: Diagnosis not present

## 2023-07-08 DIAGNOSIS — M25561 Pain in right knee: Secondary | ICD-10-CM | POA: Diagnosis not present

## 2023-08-10 DIAGNOSIS — L821 Other seborrheic keratosis: Secondary | ICD-10-CM | POA: Diagnosis not present

## 2023-08-10 DIAGNOSIS — D2261 Melanocytic nevi of right upper limb, including shoulder: Secondary | ICD-10-CM | POA: Diagnosis not present

## 2023-08-10 DIAGNOSIS — L853 Xerosis cutis: Secondary | ICD-10-CM | POA: Diagnosis not present

## 2023-08-10 DIAGNOSIS — L820 Inflamed seborrheic keratosis: Secondary | ICD-10-CM | POA: Diagnosis not present

## 2023-08-10 DIAGNOSIS — D225 Melanocytic nevi of trunk: Secondary | ICD-10-CM | POA: Diagnosis not present

## 2023-08-10 DIAGNOSIS — D1801 Hemangioma of skin and subcutaneous tissue: Secondary | ICD-10-CM | POA: Diagnosis not present

## 2023-08-23 DIAGNOSIS — B029 Zoster without complications: Secondary | ICD-10-CM | POA: Diagnosis not present

## 2023-08-23 DIAGNOSIS — R21 Rash and other nonspecific skin eruption: Secondary | ICD-10-CM | POA: Diagnosis not present

## 2023-08-24 ENCOUNTER — Ambulatory Visit (INDEPENDENT_AMBULATORY_CARE_PROVIDER_SITE_OTHER): Payer: Medicare HMO | Admitting: Family Medicine

## 2023-08-24 VITALS — BP 100/70 | HR 65 | Temp 97.7°F | Ht 64.5 in | Wt 143.0 lb

## 2023-08-24 DIAGNOSIS — B029 Zoster without complications: Secondary | ICD-10-CM

## 2023-08-24 NOTE — Progress Notes (Signed)
Established Patient Office Visit   Subjective  Patient ID: Laura Curtis, female    DOB: [birth year]  Age: 77 y.o. MRN: [id number]  Chief Complaint  Patient presents with   Herpes Zoster    Started a week ago, on the lower left back, patient was seen in UC yesterday, given Valtrex    Patient is a 77 year old female followed by Dr. Fabian Sharp and seen for acute concern.  Patient seen at [facility] yesterday for rash on low back concerning for shingles.  Given Valtrex.  Has taken 1 dose.  Pt states the rash started as a single spot then had a cluster on her L buttock and mid upper back.  Rash is pruritic.  Pt denies numbness, tingling, or burning sensation.  Pt had 3 doses of shingles vaccines.  Pt was afraid she had bed bugs.  Pt denies recent travel or having any pets.  Pt's husband is ok.    Patient Active Problem List   Diagnosis Date Noted   Primary osteoarthritis of both first carpometacarpal joints 10/04/2021   Radial nerve compression, right 10/04/2021   Cervical spine arthritis 10/04/2021   Arthritis of midfoot, left 07/23/2021   S/P TKR (total knee replacement) using cement, right 12/07/2019   Vaginal atrophy 10/06/2019   Incomplete uterine prolapse 10/06/2019   Hearing aid worn    Hx of syncope 07/21/2012   Hx of nonmelanoma skin cancer 07/21/2012   History of positive PPD    HX: benign breast biopsy    Past Medical History:  Diagnosis Date   Arthritis    Biceps tendinopathy 01/14/2012   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   Hx of varicella    HX: benign breast biopsy    Left rotator cuff tear 01/14/2012   Supraspinatus tear noted on ultrasound. Provided  instructions regarding nitroglycerin patches 1/4 patch daily to shoulder.   To continue circular motion exercises and range of motion. No over the head exercises.  No weight bearing on that side. She can continue to play tennis as tolerated.  FU in 2 weeks if no improvement, 4 weeks if improved.       Shoulder pain, left 12/31/2011   History of rotator cuff tear on the right and now has nontraumatic left shoulder pain    Syncope    June, 2013   Uterine prolapse    Past Surgical History:  Procedure Laterality Date   APPENDECTOMY  1968   BREAST BIOPSY  1998   MOHS SURGERY     for squamous cell ca left nose   OVARIAN CYST SURGERY  1968   REPLACEMENT TOTAL KNEE Right    ROTATOR CUFF REPAIR  2001   TONSILLECTOMY AND ADENOIDECTOMY     TUBAL LIGATION     Social History   Tobacco Use   Smoking status: Never   Smokeless tobacco: Never  Vaping Use   Vaping status: Never Used  Substance Use Topics   Alcohol use: Yes    Alcohol/week: 2.0 - 3.0 standard drinks of alcohol    Types: 2 -  3 Standard drinks or equivalent per week    Comment: 5 a day   Drug use: No   Family History  Problem Relation Age of Onset   Other Mother        old age died 19    Deep vein thrombosis Mother    Stroke Father        age 41   Stomach cancer Neg Hx    Esophageal cancer Neg Hx    Colon cancer Neg Hx    Allergies  Allergen Reactions   Hydrocodone Other (See Comments), Nausea Only and Nausea And Vomiting      ROS Negative unless stated above    Objective:     BP 100/70 (BP Location: Left Arm, Patient Position: Sitting, Cuff Size: Normal)   Pulse 65   Temp 97.7 F (36.5 C) (Oral)   Ht 5' 4.5" (1.638 m)   Wt 143 lb (64.9 kg)   LMP 10/28/1995   SpO2 96%   BMI 24.17 kg/m  BP Readings from Last 3 Encounters:  08/24/23 100/70  05/09/23 107/68  04/09/23 (!) 107/54   Wt Readings from Last 3 Encounters:  08/24/23 143 lb (64.9 kg)  05/09/23 140 lb (63.5 kg)  04/09/23 147 lb (66.7 kg)       Physical Exam Constitutional:      Appearance: Normal appearance.  HENT:     Head: Normocephalic and atraumatic.     Nose: Nose normal.     Mouth/Throat:     Mouth: Mucous membranes are moist.  Eyes:     Extraocular Movements: Extraocular movements intact.     Conjunctiva/sclera: Conjunctivae normal.     Pupils: Pupils are equal, round, and reactive to light.  Cardiovascular:     Rate and Rhythm: Normal rate.  Pulmonary:     Effort: Pulmonary effort is normal.  Musculoskeletal:        General: Normal range of motion.  Skin:    General: Skin is warm and dry.     Findings: Rash is not vesicular.          Comments: Dried lesions surrounded by erythema mid upper back, left lateral buttock.  2 singular drying lesions on left upper back next to bra strap and R dorsum of wrist.  No vesicles present.      No results found for any visits on 08/24/23.    Assessment & Plan:  Herpes zoster without complication  Herpetic like rash in appearance though crosses midline.  No new lesions noted.  Continue Valtrex as rx'd by UC.  Discussed OTC supportive care for pruritus including topical Benadryl or cortisone cream, or p.o. antihistamines such as Allegra, etc.  Try to avoid scratching to prevent secondary bacterial infection.  Return if symptoms worsen or fail to improve.   Deeann Saint, MD

## 2023-08-25 DIAGNOSIS — M25561 Pain in right knee: Secondary | ICD-10-CM | POA: Diagnosis not present

## 2023-08-27 ENCOUNTER — Encounter: Payer: Self-pay | Admitting: Family Medicine

## 2023-08-27 ENCOUNTER — Ambulatory Visit: Payer: Medicare HMO | Admitting: Family Medicine

## 2023-08-27 VITALS — BP 122/84 | Ht 64.5 in | Wt 140.0 lb

## 2023-08-27 DIAGNOSIS — M25551 Pain in right hip: Secondary | ICD-10-CM

## 2023-08-27 DIAGNOSIS — M25561 Pain in right knee: Secondary | ICD-10-CM | POA: Diagnosis not present

## 2023-08-27 NOTE — Progress Notes (Signed)
DATE OF VISIT: 08/27/2023        Laura Curtis DOB: 09-Apr-1946 MRN: [id number]  CC:  Rt-sided LBP  History- Laura Curtis is a 77 y.o.  female for evaluation and treatment of Rt-side LBP PMH significant for RT TKA Pain along the right low back that radiates into the posterior thigh Pain started June/July Started doing PT - did not do exercises in August due to watching gradnchildren Started playing more pickle ball Starting to feel more stiffness  Worse getting in/out of the car Worse with external rotation of hip Pain radiating to the lower leg, but stopping above the ankle No numbness/tingling Sometimes feels weaker like will trip over her feet Taking Mobic 7.5 mg p.o. daily for arthritis pain in her hands, but has not been taking anything specifically for the hip  No prior imaging Saw PT today - they think it is mainly muscular - started back to PT this week  Of note, traveling to [facility] for vacation in the near future.  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   Hx of varicella    HX: benign breast biopsy    Left rotator cuff tear 01/14/2012   Supraspinatus tear noted on ultrasound. Provided instructions regarding nitroglycerin patches 1/4 patch daily to shoulder.   To continue circular motion exercises and range of motion. No over the head exercises.  No weight bearing on that side. She can continue to play tennis as tolerated.  FU in 2 weeks if no improvement, 4  weeks if improved.       Shoulder pain, left 12/31/2011   History of rotator cuff tear on the right and now has nontraumatic left shoulder pain    Syncope    June, 2013   Uterine prolapse     Past Surgical History Past Surgical History:  Procedure Laterality Date   APPENDECTOMY  1968   BREAST BIOPSY  1998   MOHS SURGERY     for squamous cell ca left nose   OVARIAN CYST SURGERY  1968   REPLACEMENT TOTAL KNEE Right    ROTATOR CUFF REPAIR  2001   TONSILLECTOMY AND ADENOIDECTOMY     TUBAL LIGATION      Medications Current Outpatient Medications  Medication Sig Dispense Refill   estradiol (ESTRACE) 0.1 MG/GM vaginal cream 1 gram vaginally twice weekly 42.5 g 3   fluticasone (FLONASE) 50 MCG/ACT nasal spray Place 1 spray into both nostrils daily. 16 g 11   meloxicam (MOBIC) 7.5 MG tablet Take 1 tablet (7.5 mg total) by mouth daily. May increase to twice a day  if needed 60 tablet 3   SPIKEVAX syringe      valACYclovir (VALTREX) 1000 MG tablet Take 1,000 mg by mouth 3 (three) times daily.     No current facility-administered medications for this visit.    Allergies  is allergic to hydrocodone.  Family History - reviewed per EMR and intake form  Social History   reports current alcohol use of about 2.0 - 3.0 standard drinks of alcohol per week.  reports that she has never smoked. She has never used smokeless tobacco.  reports no history of drug use.   EXAM: Vitals: BP 122/84   Ht 5' 4.5" (1.638 m)   Wt 140 lb (63.5 kg)   LMP 10/28/1995   BMI 23.66 kg/m  General: AOx3, NAD, pleasant SKIN: no rashes or lesions, skin clean, dry, intact MSK: Lspine: Full range of motion without pain.  No midline tenderness.  Mild right-sided paraspinal tenderness near the lumbosacral junction.  Negative SLR bilaterally, but does have tight hamstrings on the right.  Positive FABER on the right, negative on the left.  Able to toe walk and heel walk Hips: Right hip with full range of motion  with pain at terminal internal and external rotation.  Tender palpation over the greater trochanter, no significant tenderness over the anterior posterior hip.  Positive FABER on the right, negative on the left, negative FADIR bilaterally.  Hip strength 2/5 with abduction on the left, otherwise 5/5 throughout. positive Trendelenburg test on the right Left hip with full range of motion without pain, negative FABER, negative FADIR, strength 5/5 throughout.  Negative Trendelenburg test on the left Gait: Walking with Trendelenburg gait  NEURO: sensation intact to light touch, DTR + 2/4 Achilles and patella bilaterally VASC: pulses 2+ and symmetric DP/PT bilaterally, no edema   Assessment & Plan Hip pain, right Acute right lateral hip pain with likely gluteal tear/gluteal tendinopathy.  Cannot exclude possible component of lumbar radiculopathy, but history and exam today most consistent with isolated hip etiology  Plan: -Diagnosis and treatment discussed -Already doing physical therapy, emphasized need to continue with this to improve strength and stability around the hip, thigh, core.  They have been doing some dry needling which is okay as well -She is already taking Mobic 7.5 mg p.o. daily as needed for arthritis pain of the hands.  Advise she can use Mobic 7.5 mg 1-2 tabs daily as needed which may help with her hip pain.  Advised to use sparingly no more than 1 to 2 weeks at a time -Heat or ice as needed -Follow-up 6 to 8 weeks to reassess, sooner as needed.  If not improving could consider imaging versus cortisone injection  Patient expressed understanding & agreement with above.  Encounter Diagnosis  Name Primary?   Hip pain, right Yes    No orders of the defined types were placed in this encounter.   No orders of the defined types were placed in this encounter.

## 2023-09-07 ENCOUNTER — Ambulatory Visit: Payer: Medicare HMO | Admitting: Family Medicine

## 2023-09-07 ENCOUNTER — Encounter: Payer: Self-pay | Admitting: Family Medicine

## 2023-09-07 ENCOUNTER — Ambulatory Visit (INDEPENDENT_AMBULATORY_CARE_PROVIDER_SITE_OTHER): Payer: Medicare HMO | Admitting: Family Medicine

## 2023-09-07 VITALS — BP 104/64 | HR 71 | Temp 97.8°F | Wt 140.8 lb

## 2023-09-07 DIAGNOSIS — M25551 Pain in right hip: Secondary | ICD-10-CM | POA: Diagnosis not present

## 2023-09-07 MED ORDER — MELOXICAM 15 MG PO TABS: 15.0000 mg | ORAL_TABLET | Freq: Every day | ORAL | 3 refills | Status: DC

## 2023-09-07 NOTE — Progress Notes (Signed)
   Subjective:    Patient ID: Laura Curtis, female    DOB: 1946/07/21, 77 y.o.   MRN: [id number]  HPI Here asking for a referral to Orthopedics for pain in the right hip. Over the weekend she was playing pickle ball and she felt a "snap" and a sudden sharp pain in the right posterior hip area. She has been using a walker since then because weight bearing is painful. She is taking Meloxicam 7.5 mg daily and applying ice. She has already made an appt to see Dr. Samson Frederic for this tomorrow at 3:30.    Review of Systems  Constitutional: Negative.   Respiratory: Negative.    Cardiovascular: Negative.   Musculoskeletal:  Positive for arthralgias.       Objective:   Physical Exam Constitutional:      Comments: Walks with a walker   Cardiovascular:     Rate and Rhythm: Normal rate and regular rhythm.     Pulses: Normal pulses.     Heart sounds: Normal heart sounds.  Pulmonary:     Effort: Pulmonary effort is normal.     Breath sounds: Normal breath sounds.  Musculoskeletal:     Comments: Tender in the posterior right hip area near the proximal insertion of the hamstrings to the pelvis   Neurological:     Mental Status: She is alert.           Assessment & Plan:  Right hip pain. We made the referral to see Dr. Linna Caprice as above. She will increase the Meloxicam to 15 mg daily.  Gershon Crane, MD

## 2023-09-08 DIAGNOSIS — M25551 Pain in right hip: Secondary | ICD-10-CM | POA: Diagnosis not present

## 2023-09-09 ENCOUNTER — Other Ambulatory Visit: Payer: Self-pay | Admitting: Orthopedic Surgery

## 2023-09-09 DIAGNOSIS — M25551 Pain in right hip: Secondary | ICD-10-CM

## 2023-09-13 ENCOUNTER — Ambulatory Visit
Admission: RE | Admit: 2023-09-13 | Discharge: 2023-09-13 | Disposition: A | Discharge status: Home / Self Care | Payer: Medicare HMO | Source: Ambulatory Visit | Attending: Orthopedic Surgery | Admitting: Orthopedic Surgery

## 2023-09-13 DIAGNOSIS — S76011A Strain of muscle, fascia and tendon of right hip, initial encounter: Secondary | ICD-10-CM | POA: Diagnosis not present

## 2023-09-13 DIAGNOSIS — M25551 Pain in right hip: Secondary | ICD-10-CM

## 2023-09-13 DIAGNOSIS — M7061 Trochanteric bursitis, right hip: Secondary | ICD-10-CM | POA: Diagnosis not present

## 2023-09-15 ENCOUNTER — Ambulatory Visit: Payer: Medicare HMO | Admitting: Primary Care

## 2023-09-15 ENCOUNTER — Encounter: Payer: Self-pay | Admitting: Primary Care

## 2023-09-15 VITALS — BP 101/63 | HR 70 | Temp 97.7°F | Ht 64.5 in | Wt 142.8 lb

## 2023-09-15 DIAGNOSIS — R058 Other specified cough: Secondary | ICD-10-CM

## 2023-09-15 NOTE — Patient Instructions (Signed)
Exam was normal Pulmonary function testing in May 2024 was normal No changes to your plan of care No medications needed   Follow-up As needed if you develop acute respiratory symptoms

## 2023-09-15 NOTE — Progress Notes (Signed)
@Patient  ID: Laura Curtis, female    DOB: 1945-11-06, 77 y.o.   MRN: [id number]  No chief complaint on file.   Referring provider: Madelin Headings, MD  HPI: [phone] with history of covid-19 - 2021 (symptomatically severe but was not hospitalized), positive ppd, never smoker referred for emphysema.  Previous [facility] pulmonary encounter: 03/09/23- Dr. Thora Lance  She did have RSV clinically diagnosed about 5 weeks ago. Still dealing with cough, productive. DOE with strenuous exertion. She does have some postnasal drainage, not much sinus congestion currently. Prednisone did make her feel more energetic, possibly helped with DOE, cough. She uses albuterol 1-2x per day. Doesn't help currently, did before.    She has no family history of lung disease   She is a never smoker. 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 Did have a woodstove for heat in [place] for about 7 years. No vaping or marijuana. From [place] originally, has lived in [place], [place], [place] variously.    Interval HPI Quantiferon neg, PFTs   No cough.   Never filled prescription for advair.    Feeling overall back to baseline.    Otherwise pertinent review of systems is negative.  # Subacute cough - has had delayed recovery from viral URIs/LRTIs particularly since covid-19 infection. Also sounded like she had lingering PND. This has resolved. # Abnormal CXR - borderline hyperinflation   Plan: - RSV vaccination in late September or October - would be glad to see her again in clinic in future if cough returns   09/15/2023- Interim hx  Patient presents today for subacute cough. Discussed the use of AI scribe software for clinical note transcription with the patient, who gave verbal consent to proceed.   History of Present Illness   She had a history of RSV in February, which was followed by a persistent cough. The patient was treated with prednisone and albuterol. A breathing test was conducted, which showed  normal results. The patient has no family history of lung cancer and has never smoked. 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 She lived in various locations including [place], [place], [place], and [place].  The patient had a chest x-ray that showed borderline hyperinflation, which could be associated with asthma. However, the breathing test did not show any evidence of COPD or obstructive lung disease. The lung function was 95% predicted, and the diffusion capacity was 84%, both of which are considered normal.  The patient has no current respiratory symptoms and her cough has not returned. She is interested in establishing a relationship with a new provider after Dr. Thora Lance left the practice. She also inquired about the need for an RSV vaccination, having had the infection in the past winter.        Allergies  Allergen Reactions   Hydrocodone Other (See Comments), Nausea Only and Nausea And Vomiting    Immunization History  Administered Date(s) Administered   Fluad Quad(high Dose 65+) 07/20/2019, 08/13/2021, 08/05/2022   Influenza Split 07/27/2012   Influenza, High Dose Seasonal PF 08/20/2015, 06/22/2017, 07/14/2018   Influenza,inj,Quad PF,6+ Mos 07/29/2013, 07/24/2014   Influenza-Unspecified 07/01/2020   PFIZER(Purple Top)SARS-COV-2 Vaccination 12/03/2019, 12/27/2019, 08/27/2020   Pneumococcal Conjugate-13 12/21/2015   Pneumococcal Polysaccharide-23 07/27/2012   Td 11/15/2007   Tdap 01/12/2009   Zoster Recombinant(Shingrix) 09/27/2020, 12/17/2020, 03/26/2021    Past Medical History:  Diagnosis Date   Arthritis    Biceps tendinopathy 01/14/2012   Cancer (HCC)  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   Hx of varicella    HX: benign breast biopsy    Left rotator cuff tear 01/14/2012   Supraspinatus tear noted on ultrasound. Provided instructions regarding nitroglycerin patches 1/4 patch daily to shoulder.   To continue circular motion exercises and range of motion. No over the head exercises.  No weight bearing on that side. She can continue to play tennis as tolerated.  FU in 2 weeks if no improvement, 4 weeks if improved.       Shoulder pain, left 12/31/2011   History of rotator cuff tear on the right and now has nontraumatic left shoulder pain    Syncope    June, 2013   Uterine prolapse     Tobacco History: Social History   Tobacco Use  Smoking Status Never  Smokeless Tobacco Never   Counseling given: Not Answered   Outpatient Medications Prior to Visit  Medication Sig Dispense Refill   estradiol (ESTRACE) 0.1 MG/GM vaginal cream 1 gram vaginally twice weekly 42.5 g 3   fluticasone (FLONASE) 50 MCG/ACT nasal spray Place 1 spray into both nostrils daily. 16 g 11   meloxicam (MOBIC) 15 MG tablet Take 1 tablet (15 mg total) by mouth daily. 90 tablet 3   SPIKEVAX syringe      valACYclovir (VALTREX) 1000 MG tablet Take 1,000 mg by mouth 3 (three) times daily.     No facility-administered medications prior to visit.      Review of Systems  Review of Systems  Constitutional: Negative.   Respiratory: Negative.  Negative for cough, shortness of breath and wheezing.      Physical Exam  LMP 10/28/1995  Physical Exam Constitutional:      General: She is not in acute distress.    Appearance: Normal appearance. She is not ill-appearing.  HENT:     Head: Normocephalic and atraumatic.     Mouth/Throat:     Mouth: Mucous membranes are moist.     Pharynx: Oropharynx is clear.  Cardiovascular:     Rate and Rhythm: Normal rate and regular rhythm.  Pulmonary:     Effort: Pulmonary effort is normal.     Breath  sounds: Normal breath sounds.  Musculoskeletal:        General: Normal range of motion.  Skin:    General: Skin is warm and dry.  Neurological:     General: No focal deficit present.     Mental Status: She is alert and oriented to person, place, and time. Mental status is at baseline.  Psychiatric:        Mood and Affect: Mood normal.        Behavior: Behavior normal.        Thought Content: Thought content normal.        Judgment: Judgment normal.      Lab Results:  CBC    Component Value Date/Time   WBC 7.2 03/31/2023 0904   RBC 4.59 03/31/2023 0904   HGB 13.8 03/31/2023 0904   HCT 41.0 03/31/2023 0904   PLT 219.0 03/31/2023 0904  MCV 89.3 03/31/2023 0904   MCV 89.1 04/15/2012 1729   MCH 29.4 04/15/2012 1729   MCHC 33.6 03/31/2023 0904   RDW 13.3 03/31/2023 0904   LYMPHSABS 1.4 03/31/2023 0904   MONOABS 0.6 03/31/2023 0904   EOSABS 0.1 03/31/2023 0904   BASOSABS 0.0 03/31/2023 0904    BMET    Component Value Date/Time   NA 139 03/31/2023 0904   K 4.3 03/31/2023 0904   CL 106 03/31/2023 0904   CO2 26 03/31/2023 0904   GLUCOSE 88 03/31/2023 0904   BUN 17 03/31/2023 0904   CREATININE 0.72 03/31/2023 0904   CREATININE 0.65 04/15/2012 1719   CALCIUM 9.1 03/31/2023 0904    BNP No results found for: "BNP"  ProBNP No results found for: "PROBNP"  Imaging: No results found.   Assessment & Plan:   No problem-specific Assessment & Plan notes found for this encounter.  Assessment and Plan    Post RSV infection No current respiratory symptoms. History of cough post RSV infection in February 2024, resolved with prednisone and albuterol. Normal pulmonary function tests in May 2024. -No further action required unless symptoms recur.  -Recommend RSV vaccination at local pharmacy.  Possible Emphysema Borderline hyperinflation noted on chest x-ray. No evidence of COPD on pulmonary function tests. No current respiratory symptoms. -No further action required  unless symptoms recur.  Follow-up No current respiratory symptoms. Patient advised to contact the office if symptoms recur. If no symptoms, no need for follow-up unless it has been more than three years since last visit.      Glenford Bayley, NP 09/15/2023

## 2023-09-28 ENCOUNTER — Other Ambulatory Visit: Payer: Self-pay | Admitting: Orthopedic Surgery

## 2023-10-01 ENCOUNTER — Ambulatory Visit (INDEPENDENT_AMBULATORY_CARE_PROVIDER_SITE_OTHER): Payer: Medicare HMO | Admitting: Family Medicine

## 2023-10-01 DIAGNOSIS — Z0000 Encounter for general adult medical examination without abnormal findings: Secondary | ICD-10-CM

## 2023-10-01 NOTE — Progress Notes (Signed)
PATIENT CHECK-IN and HEALTH RISK ASSESSMENT QUESTIONNAIRE:  -completed by phone  Pre-Visit Check-in: 1)Vitals (height, wt, BP, etc) - record in vitals section for visit on day of visit Request home vitals (wt, BP, etc.) and enter into vitals, THEN update Vital Signs SmartPhrase below at the top of the HPI. See below.  2)Review and Update Medications, Allergies PMH, Surgeries, Social history in Epic 3)Hospitalizations in the last year with date/reason?   no 4)Review and Update Care Team (patient's specialists) in Epic 5) Complete PHQ9 in Epic  6) Complete Fall Screening in Epic 7)Review all Health Maintenance Due and order under PCP if not done.  Medicare Wellness Patient Questionnaire:  Answer theses question about your habits: How often do you have a drink containing alcohol? 3 How many drinks containing alcohol do you have on a typical day when you are drinking? 5 oz How often do you have six or more drinks on one occasion? no Have you ever smoked? no How many packs a day do/did you smoke? no Do you use smokeless tobacco?no Do you use an illicit drugs?no On average, how many days per week do you engage in moderate to strenuous exercise (like a brisk walk)? 5 x per week On average, how many minutes do you engage in exercise at this level?  1.5 hour per time, limited now due to injury, still trying to walk and stay active Feels has great social connections, in recovery group for child with addiction, strong connections Feels diet is pretty healthy, cooks, mindful, doing weight watchers  Answer theses question about your everyday activities: Can you perform most household chores? yes Are you deaf or have significant trouble hearing? Has hearing aids Do you feel that you have a problem with memory? no Do you feel safe at home? yes Last dentist visit? Yes every 6 month 8. Do you have any difficulty performing your everyday activities? no Are you having any difficulty walking, taking  medications on your own, and or difficulty managing daily home needs? no Do you have difficulty walking or climbing stairs? no Do you have difficulty dressing or bathing? no Do you have difficulty doing errands alone such as visiting a doctor's office or shopping? no Do you currently have any difficulty preparing food and eating? no Do you currently have any difficulty using the toilet? no Do you have any difficulty managing your finances? no Do you have any difficulties with housekeeping of managing your housekeeping? no   Do you have Advanced Directives in place (Living Will, Healthcare Power or Attorney)?  both   Last eye Exam and location? Sees eye doctor on regular basis as well   Do you currently use prescribed or non-prescribed narcotic or opioid pain medications? no  Do you have a history or close family history of breast, ovarian, tubal or peritoneal cancer or a family member with BRCA (breast cancer susceptibility 1 and 2) gene mutations?      ----------------------------------------------------------------------------------------------------------------------------------------------------------------------------------------------------------------------  Because this visit was a virtual/telehealth visit, some criteria may be missing or patient reported. Any vitals not documented were not able to be obtained and vitals that have been documented are patient reported.    MEDICARE ANNUAL PREVENTIVE VISIT WITH PROVIDER: (Welcome to Medicare, initial annual wellness or annual wellness exam)  Virtual Visit via Phone Note  I connected with Laura Curtis on 10/01/23 by phone and verified that I am speaking with the correct person using two identifiers.  Location patient: home Location provider:work or home office Persons participating  in the virtual visit: patient, provider  Concerns and/or follow up today: seeing orthopedic doctor for a torn gluteus minimus muscle.     See HM section in Epic for other details of completed HM.    ROS: negative for report of fevers, unintentional weight loss, vision changes, vision loss, hearing loss or change, chest pain, sob, hemoptysis, melena, hematochezia, hematuria, falls, bleeding or bruising, thoughts of suicide or self harm, memory loss  Patient-completed extensive health risk assessment - reviewed and discussed with the patient: See Health Risk Assessment completed with patient prior to the visit either above or in recent phone note. This was reviewed in detailed with the patient today and appropriate recommendations, orders and referrals were placed as needed per Summary below and patient instructions.   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   Hx of varicella    HX: benign breast biopsy    Left rotator cuff tear 01/14/2012   Supraspinatus tear noted on ultrasound. Provided instructions regarding nitroglycerin patches 1/4 patch daily to shoulder.   To continue circular motion exercises and range of motion. No over the head exercises.  No weight bearing on that side. She can continue to play tennis as tolerated.  FU in 2 weeks if no improvement, 4 weeks if improved.       Shoulder pain,  left 12/31/2011   History of rotator cuff tear on the right and now has nontraumatic left shoulder pain    Syncope    June, 2013   Uterine prolapse     Past Surgical History:  Procedure Laterality Date   APPENDECTOMY  1968   BREAST BIOPSY  1998   MOHS SURGERY     for squamous cell ca left nose   OVARIAN CYST SURGERY  1968   REPLACEMENT TOTAL KNEE Right    ROTATOR CUFF REPAIR  2001   TONSILLECTOMY AND ADENOIDECTOMY     TUBAL LIGATION      Social History   Socioeconomic History   Marital status: Married    Spouse name: Not on file   Number of children: 3   Years of education: Not on file   Highest education level: Bachelor's degree (e.g., BA, AB, BS)  Occupational History   Occupation: radiation therapist  Tobacco Use   Smoking status: Never   Smokeless tobacco: Never  Vaping Use   Vaping status: Never Used  Substance and Sexual Activity   Alcohol use: Yes    Alcohol/week: 2.0 - 3.0 standard drinks of alcohol    Types: 2 - 3 Standard drinks or equivalent per week    Comment: 5 a day   Drug use: No   Sexual  activity: Not Currently    Birth control/protection: None, Surgical    Comment: BTL  Other Topics Concern   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 Sees dentist on a regular basis.    etoh 3 x per week                Social Determinants of Health   Financial Resource Strain: Low Risk  (07/07/2022)   Overall Financial Resource Strain (CARDIA)    Difficulty of Paying Living Expenses: Not hard at all  Food Insecurity: No Food Insecurity (08/24/2023)   Hunger Vital Sign    Worried About Running Out of Food in the Last Year: Never true    Ran Out of Food in the Last Year: Never true  Transportation Needs: No Transportation Needs (08/24/2023)   PRAPARE -  Administrator, Civil Service (Medical): No    Lack of Transportation (Non-Medical): No  Physical Activity: Sufficiently Active (08/24/2023)   Exercise Vital Sign    Days of Exercise per Week: 3 days    Minutes of Exercise per Session: 60 min  Stress: No Stress Concern Present (08/24/2023)   [employer] of Occupational Health - Occupational Stress Questionnaire    Feeling of Stress : Only a little  Social Connections: Socially Integrated (08/24/2023)   Social Connection and Isolation Panel [NHANES]    Frequency of Communication with Friends and Family: More than three times a week    Frequency of Social Gatherings with Friends and Family: Three times a week    Attends Religious Services: More than 4 times per year    Active Member of Clubs or Organizations: Yes    Attends Engineer, structural: More than 4 times per year    Marital Status: Married  Catering manager Violence: Not on file    Family History  Problem Relation Age of Onset   Other Mother        old age died 1    Deep vein thrombosis Mother    Stroke Father        age 68   Stomach cancer Neg Hx    Esophageal cancer Neg Hx    Colon cancer Neg Hx     Current Outpatient Medications on File Prior to Visit  Medication Sig Dispense Refill   estradiol (ESTRACE) 0.1 MG/GM vaginal cream 1 gram vaginally twice weekly 42.5 g 3   fluticasone (FLONASE) 50 MCG/ACT nasal spray Place 1 spray into both nostrils daily. 16 g 11   meloxicam (MOBIC) 15 MG tablet Take 1 tablet (15 mg total) by mouth daily. 90 tablet 3   SPIKEVAX syringe      valACYclovir (VALTREX) 1000 MG tablet Take 1,000 mg by mouth 3 (three) times daily.     No current facility-administered medications on file prior to visit.    Allergies  Allergen Reactions   Hydrocodone Other (See Comments), Nausea Only and Nausea And Vomiting       Physical Exam Vitals requested from patient and listed below if patient had equipment and was  able to obtain at home for this virtual visit: There were no vitals filed for this visit. Estimated body mass index is 24.13 kg/m  as calculated from the following:   Height as of 09/15/23: 5' 4.5" (1.638 m).   Weight as of 09/15/23: 142 lb 12.8 oz (64.8 kg).  EKG (optional): deferred due to virtual visit  GENERAL: alert, oriented, no acute distress detected, full vision exam deferred due to pandemic and/or virtual encounter  PSYCH/NEURO: pleasant and cooperative, no obvious depression or anxiety, speech and thought processing grossly intact, Cognitive function grossly intact  Flowsheet Row Office Visit from [facility]/24/2023 in [facility] [place]  PHQ-9 Total Score 1           10/01/2023   12:32 PM 08/24/2023    1:22 PM 03/31/2023    2:31 PM 03/31/2023    8:21 AM 12/31/2022    9:52 AM  Depression screen PHQ 2/9  Decreased Interest 0 0 0 0 0  Down, Depressed, Hopeless 0 0 0 0 0  PHQ - 2 Score 0 0 0 0 0  Altered sleeping  1  0 0  Tired, decreased energy  0  0 2  Change in appetite  0  0 1  Feeling bad or failure about yourself   0  0 0  Trouble concentrating  0  0 1  Moving slowly or fidgety/restless  0  0 0  Suicidal thoughts  0  0 0  PHQ-9 Score  1  0 4  Difficult doing work/chores    Not difficult at all Not difficult at all       12/31/2022    9:52 AM 03/31/2023    8:21 AM 08/24/2023    8:37 AM 08/24/2023    1:22 PM 10/01/2023   12:32 PM  Fall Risk  Falls in the past year? 0 1 0 0 0  Was there an injury with Fall? 0 0  0 0  Fall Risk Category Calculator 0 2  0 0  Patient at Risk for Falls Due to No Fall Risks No Fall Risks  No Fall Risks   Fall risk Follow up Falls evaluation completed Falls evaluation completed  Falls evaluation completed      SUMMARY AND PLAN:  Encounter for Medicare annual wellness exam   Discussed applicable health maintenance/preventive health measures and advised and referred or ordered per patient preferences: -bone  density done with gyn in 2022 and normal, she plans to do every 3-5 year with gyn  -she did her flu and covid shots at [facility] recently Health Maintenance  Topic Date Due   COVID-19 Vaccine (5 - 2023-24 season) 09/02/2023   Medicare Annual Wellness (AWV)  09/30/2024   Pneumonia Vaccine 41+ Years old  Completed   INFLUENZA VACCINE  Completed   DEXA SCAN  Completed   Hepatitis C Screening  Completed   Zoster Vaccines- Shingrix  Completed   HPV VACCINES  Aged Out   DTaP/Tdap/Td  Discontinued   Fecal DNA (Cologuard)  Discontinued      Education and counseling on the following was provided based on the above review of health and a plan/checklist for the patient, along with additional information discussed, was provided for the patient in the patient instructions :   -Advised and counseled on a healthy lifestyle - including the importance of a healthy diet, regular physical activity, social connections  -Reviewed patient's current diet. Advised and counseled on a whole foods based healthy diet. A summary of a healthy diet was provided in the Patient Instructions.  -reviewed patient's current physical activity level and discussed exercise guidelines for adults. Discussed community  resources and ideas for safe exercise at home to assist in meeting exercise guideline recommendations in a safe and healthy way.  -Advise yearly dental visits at minimum and regular eye exams -Advised and counseled on alcohol safe limits, risks  Follow up: see patient instructions     Patient Instructions  I really enjoyed getting to talk with you today! I am available on Tuesdays and Thursdays for virtual visits if you have any questions or concerns, or if I can be of any further assistance.   CHECKLIST FROM ANNUAL WELLNESS VISIT:  -Follow up (please call to schedule if not scheduled after visit):   -yearly for annual wellness visit with primary care office  Here is a list of your preventive care/health  maintenance measures and the plan for each if any are due:  PLAN For any measures below that may be due:   Health Maintenance  Topic Date Due   COVID-19 Vaccine (5 - 2023-24 season) 09/02/2023   Medicare Annual Wellness (AWV)  09/30/2024   Pneumonia Vaccine 67+ Years old  Completed   INFLUENZA VACCINE  Completed   DEXA SCAN  Completed   Hepatitis C Screening  Completed   Zoster Vaccines- Shingrix  Completed   HPV VACCINES  Aged Out   DTaP/Tdap/Td  Discontinued   Fecal DNA (Cologuard)  Discontinued    -See a dentist at least yearly  -Get your eyes checked and then per your eye specialist's recommendations  -Other issues addressed today:   -I have included below further information regarding a healthy whole foods based diet, physical activity guidelines for adults, stress management and opportunities for social connections. I hope you find this information useful.   -----------------------------------------------------------------------------------------------------------------------------------------------------------------------------------------------------------------------------------------------------------  NUTRITION: -eat real food: lots of colorful vegetables (half the plate) and fruits -5-7 servings of vegetables and fruits per day (fresh or steamed is best), exp. 2 servings of vegetables with lunch and dinner and 2 servings of fruit per day. Berries and greens such as kale and collards are great choices.  -consume on a regular basis: whole grains (make sure first ingredient on label contains the word "whole"), fresh fruits, fish, nuts, seeds, healthy oils (such as olive oil, avocado oil, grape seed oil) -may eat small amounts of dairy and lean meat on occasion, but avoid processed meats such as ham, bacon, lunch meat, etc. -drink water -try to avoid fast food and pre-packaged foods, processed meat -most experts advise limiting sodium to < 2300mg  per day, should limit  further is any chronic conditions such as high blood pressure, heart disease, diabetes, etc. The American Heart Association advised that < 1500mg  is is ideal -try to avoid foods that contain any ingredients with names you do not recognize  -try to avoid sugar/sweets (except for the natural sugar that occurs in fresh fruit) -try to avoid sweet drinks -try to avoid white rice, white bread, pasta (unless whole grain), white or yellow potatoes  EXERCISE GUIDELINES FOR ADULTS: -if you wish to increase your physical activity, do so gradually and with the approval of your doctor -STOP and seek medical care immediately if you have any chest pain, chest discomfort or trouble breathing when starting or increasing exercise  -move and stretch your body, legs, feet and arms when sitting for long periods -Physical activity guidelines for optimal health in adults: -least 150 minutes per week of aerobic exercise (can talk, but not sing) once approved by your doctor, 20-30 minutes of sustained activity or two 10 minute episodes of sustained activity every  day.  -resistance training at least 2 days per week if approved by your doctor -balance exercises 3+ days per week:   Stand somewhere where you have something sturdy to hold onto if you lose balance.    1) lift up on toes, start with 5x per day and work up to 20x   2) stand and lift on leg straight out to the side so that foot is a few inches of the floor, start with 5x each side and work up to 20x each side   3) stand on one foot, start with 5 seconds each side and work up to 20 seconds on each side  If you need ideas or help with getting more active:  -Silver sneakers https://tools.silversneakers.com  -Walk with a Doc: http://www.duncan-williams.com/  -try to include resistance (weight lifting/strength building) and balance exercises twice per week: or the following link for  ideas: http://castillo-powell.com/  BuyDucts.dk  STRESS MANAGEMENT: -can try meditating, or just sitting quietly with deep breathing while intentionally relaxing all parts of your body for 5 minutes daily -if you need further help with stress, anxiety or depression please follow up with your primary doctor or contact the wonderful folks at [facility]: [phone]  SOCIAL CONNECTIONS: -options in [place] if you wish to engage in more social and exercise related activities:  -Silver sneakers https://tools.silversneakers.com  -Walk with a Doc: http://www.duncan-williams.com/  -Check out the [facility] Active Adults 50+ section on the [place] of [employer] (hiking clubs, book clubs, cards and games, chess, exercise classes, aquatic classes and much more) - see the website for details: https://www.[place].gov/departments/parks-recreation/active-adults50  -YouTube has lots of exercise videos for different ages and abilities as well  -[facility] (a variety of indoor and outdoor inperson activities for adults). [phone]. [address]  -Virtual Online Classes (a variety of topics): see seniorplanet.org or call [phone]  -consider volunteering at a school, hospice center, church, senior center or elsewhere           Terressa Koyanagi, DO

## 2023-10-01 NOTE — Patient Instructions (Signed)
I really enjoyed getting to talk with you today! I am available on Tuesdays and Thursdays for virtual visits if you have any questions or concerns, or if I can be of any further assistance.   CHECKLIST FROM ANNUAL WELLNESS VISIT:  -Follow up (please call to schedule if not scheduled after visit):   -yearly for annual wellness visit with primary care office  Here is a list of your preventive care/health maintenance measures and the plan for each if any are due:  PLAN For any measures below that may be due:   Health Maintenance  Topic Date Due   COVID-19 Vaccine (5 - 2023-24 season) 09/02/2023   Medicare Annual Wellness (AWV)  09/30/2024   Pneumonia Vaccine 60+ Years old  Completed   INFLUENZA VACCINE  Completed   DEXA SCAN  Completed   Hepatitis C Screening  Completed   Zoster Vaccines- Shingrix  Completed   HPV VACCINES  Aged Out   DTaP/Tdap/Td  Discontinued   Fecal DNA (Cologuard)  Discontinued    -See a dentist at least yearly  -Get your eyes checked and then per your eye specialist's recommendations  -Other issues addressed today:   -I have included below further information regarding a healthy whole foods based diet, physical activity guidelines for adults, stress management and opportunities for social connections. I hope you find this information useful.   -----------------------------------------------------------------------------------------------------------------------------------------------------------------------------------------------------------------------------------------------------------  NUTRITION: -eat real food: lots of colorful vegetables (half the plate) and fruits -5-7 servings of vegetables and fruits per day (fresh or steamed is best), exp. 2 servings of vegetables with lunch and dinner and 2 servings of fruit per day. Berries and greens such as kale and collards are great choices.  -consume on a regular basis: whole grains (make sure first  ingredient on label contains the word "whole"), fresh fruits, fish, nuts, seeds, healthy oils (such as olive oil, avocado oil, grape seed oil) -may eat small amounts of dairy and lean meat on occasion, but avoid processed meats such as ham, bacon, lunch meat, etc. -drink water -try to avoid fast food and pre-packaged foods, processed meat -most experts advise limiting sodium to < 2300mg  per day, should limit further is any chronic conditions such as high blood pressure, heart disease, diabetes, etc. The American Heart Association advised that < 1500mg  is is ideal -try to avoid foods that contain any ingredients with names you do not recognize  -try to avoid sugar/sweets (except for the natural sugar that occurs in fresh fruit) -try to avoid sweet drinks -try to avoid white rice, white bread, pasta (unless whole grain), white or yellow potatoes  EXERCISE GUIDELINES FOR ADULTS: -if you wish to increase your physical activity, do so gradually and with the approval of your doctor -STOP and seek medical care immediately if you have any chest pain, chest discomfort or trouble breathing when starting or increasing exercise  -move and stretch your body, legs, feet and arms when sitting for long periods -Physical activity guidelines for optimal health in adults: -least 150 minutes per week of aerobic exercise (can talk, but not sing) once approved by your doctor, 20-30 minutes of sustained activity or two 10 minute episodes of sustained activity every day.  -resistance training at least 2 days per week if approved by your doctor -balance exercises 3+ days per week:   Stand somewhere where you have something sturdy to hold onto if you lose balance.    1) lift up on toes, start with 5x per day and work up to  20x   2) stand and lift on leg straight out to the side so that foot is a few inches of the floor, start with 5x each side and work up to 20x each side   3) stand on one foot, start with 5 seconds each  side and work up to 20 seconds on each side  If you need ideas or help with getting more active:  -Silver sneakers https://tools.silversneakers.com  -Walk with a Doc: http://www.duncan-williams.com/  -try to include resistance (weight lifting/strength building) and balance exercises twice per week: or the following link for ideas: http://castillo-powell.com/  BuyDucts.dk  STRESS MANAGEMENT: -can try meditating, or just sitting quietly with deep breathing while intentionally relaxing all parts of your body for 5 minutes daily -if you need further help with stress, anxiety or depression please follow up with your primary doctor or contact the wonderful folks at [facility]: [phone]  SOCIAL CONNECTIONS: -options in [place] if you wish to engage in more social and exercise related activities:  -Silver sneakers https://tools.silversneakers.com  -Walk with a Doc: http://www.duncan-williams.com/  -Check out the [facility] Active Adults 50+ section on the [place] [employer] (hiking clubs, book clubs, cards and games, chess, exercise classes, aquatic classes and much more) - see the website for details: https://www.[place].gov/departments/parks-recreation/active-adults50  -YouTube has lots of exercise videos for different ages and abilities as well  -[facility] (a variety of indoor and outdoor inperson activities for adults). [phone]. [address]  -Virtual Online Classes (a variety of topics): see seniorplanet.org or call [phone]  -consider volunteering at a school, hospice center, church, senior center or elsewhere

## 2023-10-13 DIAGNOSIS — M545 Low back pain, unspecified: Secondary | ICD-10-CM | POA: Diagnosis not present

## 2023-10-13 DIAGNOSIS — L245 Irritant contact dermatitis due to other chemical products: Secondary | ICD-10-CM | POA: Diagnosis not present

## 2023-11-24 DIAGNOSIS — M7061 Trochanteric bursitis, right hip: Secondary | ICD-10-CM | POA: Diagnosis not present

## 2023-11-25 DIAGNOSIS — H0012 Chalazion right lower eyelid: Secondary | ICD-10-CM | POA: Diagnosis not present

## 2023-12-10 DIAGNOSIS — M25551 Pain in right hip: Secondary | ICD-10-CM | POA: Diagnosis not present

## 2023-12-10 DIAGNOSIS — M1611 Unilateral primary osteoarthritis, right hip: Secondary | ICD-10-CM | POA: Diagnosis not present

## 2024-01-05 DIAGNOSIS — M7061 Trochanteric bursitis, right hip: Secondary | ICD-10-CM | POA: Diagnosis not present

## 2024-02-22 ENCOUNTER — Encounter (HOSPITAL_BASED_OUTPATIENT_CLINIC_OR_DEPARTMENT_OTHER): Payer: Self-pay | Admitting: Obstetrics & Gynecology

## 2024-02-22 ENCOUNTER — Ambulatory Visit (HOSPITAL_BASED_OUTPATIENT_CLINIC_OR_DEPARTMENT_OTHER): Payer: Medicare HMO | Admitting: Obstetrics & Gynecology

## 2024-02-22 VITALS — BP 120/68 | HR 64 | Ht 64.5 in | Wt 131.4 lb

## 2024-02-22 DIAGNOSIS — N812 Incomplete uterovaginal prolapse: Secondary | ICD-10-CM | POA: Diagnosis not present

## 2024-02-22 DIAGNOSIS — B009 Herpesviral infection, unspecified: Secondary | ICD-10-CM

## 2024-02-22 DIAGNOSIS — Z9189 Other specified personal risk factors, not elsewhere classified: Secondary | ICD-10-CM

## 2024-02-22 DIAGNOSIS — N952 Postmenopausal atrophic vaginitis: Secondary | ICD-10-CM | POA: Diagnosis not present

## 2024-02-22 DIAGNOSIS — Z01411 Encounter for gynecological examination (general) (routine) with abnormal findings: Secondary | ICD-10-CM | POA: Diagnosis not present

## 2024-02-22 MED ORDER — ESTRADIOL 0.1 MG/GM VA CREA: TOPICAL_CREAM | VAGINAL | 3 refills | Status: AC

## 2024-02-22 NOTE — Progress Notes (Signed)
 Breast and Pelvic Exam Patient name: Laura Curtis MRN [id number]  Date of birth: 1946-10-21 Chief Complaint:   Breast and pelvic exam  History of Present Illness:   Laura Curtis is a 78 y.o. [id number] Caucasian female being seen today for breast and pelvic exam.  Doing well.  Denies vaginal bleeding.  Has not had any recent UTI issues.  Does use vaginal estrogen cream from time to time.  Does need refill.   Patient's last menstrual period was 10/28/1995.  Last pap 01/26/2018. Results were:  normal .  Last mammogram:  Declined Last colonoscopy: declined but 04/13/2023 cologuard was negative.  Will repeat next year. DEXA:   02/18/2021 normal.       02/22/2024    3:48 PM 10/01/2023   12:32 PM 08/24/2023    1:22 PM 03/31/2023    2:31 PM 03/31/2023    8:21 AM  Depression screen PHQ 2/9  Decreased Interest 0 0 0 0 0  Down, Depressed, Hopeless 0 0 0 0 0  PHQ - 2 Score 0 0 0 0 0  Altered sleeping   1  0  Tired, decreased energy   0  0  Change in appetite   0  0  Feeling bad or failure about yourself    0  0  Trouble concentrating   0  0  Moving slowly or fidgety/restless   0  0  Suicidal thoughts   0  0  PHQ-9 Score   1  0  Difficult doing work/chores     Not difficult at all        08/24/2023    1:23 PM 03/31/2023    8:22 AM 12/31/2022    9:52 AM  GAD 7 : Generalized Anxiety Score  Nervous, Anxious, on Edge 1 0 0  Control/stop worrying 0 0 0  Worry too much - different things 1 0 0  Trouble relaxing 1 0 0  Restless 0 0 0  Easily annoyed or irritable 0 0 0  Afraid - awful might happen 0 0 0  Total GAD 7 Score 3 0 0  Anxiety Difficulty Not difficult at all Not difficult at all Not difficult at all     Review of Systems:   Pertinent items are noted in HPI Denies any urinary or bowel changes and no vaginal bleeding Pertinent History Reviewed:  Reviewed past medical,surgical, social and family history.  Reviewed problem list, medications and allergies. Physical Assessment:    Vitals:   02/22/24 1546  BP: 120/68  Pulse: 64  Weight: 131 lb 6.4 oz (59.6 kg)  Height: 5' 4.5" (1.638 m)  Body mass index is 22.21 kg/m.        Physical Examination:   General appearance - well appearing, and in no distress  Mental status - alert, oriented to person, place, and time  Psych:  She has a normal mood and affect  Skin - warm and dry, normal color, no suspicious lesions noted  Chest - effort normal, all lung fields clear to auscultation bilaterally  Heart - normal rate and regular rhythm  Neck:  midline trachea, no thyromegaly or nodules  Breasts - breasts appear normal, no suspicious masses, no skin or nipple changes or  axillary nodes  Abdomen - soft, nontender, nondistended, no masses or organomegaly  Pelvic - VULVA: normal appearing vulva with no masses, tenderness or lesions    VAGINA: normal appearing vagina with normal color and discharge, no lesions    CERVIX: normal  appearing cervix without discharge or lesions, no CMT  Thin prep pap is not obtained  UTERUS: uterus is felt to be normal size, shape, consistency and nontender   ADNEXA: No adnexal masses or tenderness noted.  Rectal - normal rectal, good sphincter tone, no masses felt.   Extremities:  No swelling or varicosities noted  Chaperone present for exam  Assessment & Plan:  1. GYN exam for high-risk Medicare patient (Primary) - Pap smear 2019 and normal.  Not indicated for routine screening. - Mammogram declined - Colonoscopy declined.  Negative cologuard 04/13/2023 - Bone mineral density 02/18/2021 - lab work done with PCP, Dr. Alyne Babinski, last summer - vaccines reviewed/updated  2. Vaginal atrophy - estradiol  (ESTRACE ) 0.1 MG/GM vaginal cream; 1 gram vaginally twice weekly  Dispense: 42.5 g; Refill: 3  3. 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   Meds:   Meds ordered this encounter  Medications   estradiol  (ESTRACE ) 0.1 MG/GM vaginal cream    Sig: 1 gram vaginally twice weekly    Dispense:  42.5 g    Refill:  3    Follow-up: Return in about 1 year (around 02/21/2025).  Lillian Rein, MD 02/26/2024 12:30 AM

## 2024-02-25 ENCOUNTER — Ambulatory Visit: Payer: Self-pay

## 2024-02-25 NOTE — Telephone Encounter (Signed)
 Copied from CRM [id number]. Topic: Clinical - Red Word Triage >> Feb 25, 2024  1:47 PM Howard Macho wrote: Reason for EAV:[id number] stated yesterday she got poison ivy and now it is flaring up. patient stated it is on her face and arms  Chief Complaint: Poison ivy Symptoms: Rash, itching Frequency: Yesterday Pertinent Negatives: Patient denies relief Disposition: [] ED /[] Urgent Care (no appt availability in office) / [x] Appointment(In office/virtual)/ []  [place] [facility]/ [] Home Care/ [] Refused Recommended Disposition /[] [facility]/ []  Follow-up with PCP Additional Notes: Patient called in to report poison ivy on her face, left forearm and right finger. Patient stated the rash is red, raised and textured. Patient stated itching is a 7 out of 10. Advised patient to be seen within 24 hours, per protocol. No availability with PCP. Scheduled with alternate provider in office. Provided care advice and instructed patient to call back if symptoms worsen. Patient complied.   Reason for Disposition  MODERATE to SEVERE itching (e.g., interferes with work, school, sleep, or other activities)  Answer Assessment - Initial Assessment Questions 1. APPEARANCE of RASH: "Describe the rash."      Raised, texture to skid, ""irritated red" 2. LOCATION: "Where is the rash located?"  (e.g., face, genitals, hands, legs)     Face, left forearm and right finger 3. SIZE: "How large is the rash?"      Biggest area covers chin 4. ONSET: "When did the rash begin?"      Yesterday 5. ITCHING: "Does the rash itch?" If Yes, ask: "How bad is it?"   - MILD - doesn't interfere with normal activities   - MODERATE-SEVERE: interferes with work, school, sleep, or other activities      Moderate-severe, rates itching a 7 6. EXPOSURE:  "How were you exposed to the plant (poison ivy, poison oak, sumac)"  "When were you exposed?"       States she was outside on Monday 7. PAST HISTORY: "Have you had a poison ivy rash  before?" If Yes, ask: "How bad was it?"     Yes  Protocols used: Poison Ivy - Oak - Sumac-A-AH

## 2024-02-26 ENCOUNTER — Encounter: Payer: Self-pay | Admitting: Adult Health

## 2024-02-26 ENCOUNTER — Ambulatory Visit (INDEPENDENT_AMBULATORY_CARE_PROVIDER_SITE_OTHER): Admitting: Adult Health

## 2024-02-26 ENCOUNTER — Encounter (HOSPITAL_BASED_OUTPATIENT_CLINIC_OR_DEPARTMENT_OTHER): Payer: Self-pay | Admitting: Obstetrics & Gynecology

## 2024-02-26 VITALS — BP 100/60 | HR 70 | Temp 98.1°F | Ht 64.5 in | Wt 130.0 lb

## 2024-02-26 DIAGNOSIS — L237 Allergic contact dermatitis due to plants, except food: Secondary | ICD-10-CM | POA: Diagnosis not present

## 2024-02-26 MED ORDER — METHYLPREDNISOLONE ACETATE 80 MG/ML IJ SUSP
80.0000 mg | Freq: Once | INTRAMUSCULAR | Status: AC
  Administered 2024-02-26: 80 mg via INTRAMUSCULAR

## 2024-02-26 MED ORDER — PREDNISONE 10 MG PO TABS: 10.0000 mg | ORAL_TABLET | Freq: Every day | ORAL | 0 refills | Status: DC

## 2024-02-26 MED ORDER — METHYLPREDNISOLONE ACETATE 40 MG/ML IJ SUSP
40.0000 mg | Freq: Once | INTRAMUSCULAR | Status: AC
  Administered 2024-02-26: 40 mg via INTRAMUSCULAR

## 2024-02-26 MED ORDER — METHYLPREDNISOLONE ACETATE 80 MG/ML IJ SUSP: 120.0000 mg | Freq: Once | INTRAMUSCULAR | Status: DC

## 2024-02-26 NOTE — Progress Notes (Signed)
 Subjective:    Patient ID: Laura Curtis, female    DOB: 01-04-1946, 78 y.o.   MRN: [id number]  Poison James Mcardle    78 year old female who  has a past medical history of Arthritis, Biceps tendinopathy (01/14/2012), Cancer [facility]), Chronic pain of right knee (04/15/2013), COVID-19, Hearing aid worn, Hepatitis A, Hip pain, right (12/31/2011), History of positive PPD, varicella, benign breast biopsy, Left rotator cuff tear (01/14/2012), Shoulder pain, left (12/31/2011), Syncope, and Uterine prolapse.  She is a patient of Dr. Alyne Babinski who I am seeing today for an acute issue. She was working out in the yard 5 days ago and then next day developed a red itchy rash between her fingers on her right hand. She then developed a rash on her face and left upper arm. She denies CP, SOB, or trouble swallowing.   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   Hx of varicella    HX: benign breast biopsy    Left rotator cuff tear 01/14/2012   Supraspinatus tear noted on ultrasound. Provided instructions regarding nitroglycerin  patches 1/4 patch daily to shoulder.   To continue circular motion exercises and range of motion. No over the head exercises.  No weight bearing on that side. She can continue to play tennis as tolerated.  FU in 2 weeks if no improvement, 4 weeks if improved.       Shoulder pain, left 12/31/2011   History of rotator cuff tear on the right and now has nontraumatic left shoulder pain    Syncope    June, 2013   Uterine prolapse     Social History   Socioeconomic  History   Marital status: Married    Spouse name: Not on file   Number of children: 3   Years of education: Not on file   Highest education level: Bachelor's degree (e.g., BA, AB, BS)  Occupational History   Occupation: radiation therapist  Tobacco Use   Smoking status: Never   Smokeless tobacco: Never  Vaping Use   Vaping status: Never Used  Substance and Sexual Activity   Alcohol use: Yes    Alcohol/week: 2.0 - 3.0 standard drinks of alcohol    Types: 2 - 3 Standard drinks or equivalent per week    Comment: 5 a day   Drug use: No   Sexual activity: Not Currently    Birth control/protection: None, Surgical    Comment: BTL  Other Topics Concern   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 Sees dentist on a regular basis.    etoh 3 x per week  Social Drivers of Corporate investment banker Strain: Low Risk  (07/07/2022)   Overall Financial Resource Strain (CARDIA)    Difficulty of Paying Living Expenses: Not hard at all  Food Insecurity: No Food Insecurity (08/24/2023)   Hunger Vital Sign    Worried About Running Out of Food in the Last Year: Never true    Ran Out of Food in the Last Year: Never true  Transportation Needs: No Transportation Needs (08/24/2023)   PRAPARE - Administrator, Civil Service (Medical): No    Lack of Transportation (Non-Medical): No  Physical Activity: Sufficiently Active (08/24/2023)   Exercise Vital Sign    Days of Exercise per Week: 3 days    Minutes of Exercise per Session: 60 min  Stress: No Stress Concern Present (08/24/2023)   [employer] of Occupational Health - Occupational Stress Questionnaire    Feeling of Stress : Only a little  Social Connections: Socially Integrated (08/24/2023)   Social Connection and  Isolation Panel [NHANES]    Frequency of Communication with Friends and Family: More than three times a week    Frequency of Social Gatherings with Friends and Family: Three times a week    Attends Religious Services: More than 4 times per year    Active Member of Clubs or Organizations: Yes    Attends Engineer, structural: More than 4 times per year    Marital Status: Married  Catering manager Violence: Not on file    Past Surgical History:  Procedure Laterality Date   APPENDECTOMY  1968   BREAST BIOPSY  1998   MOHS SURGERY     for squamous cell ca left nose   OVARIAN CYST SURGERY  1968   REPLACEMENT TOTAL KNEE Right    ROTATOR CUFF REPAIR  2001   TONSILLECTOMY AND ADENOIDECTOMY     TUBAL LIGATION      Family History  Problem Relation Age of Onset   Other Mother        old age died 65    Deep vein thrombosis Mother    Stroke Father        age 81   Stomach cancer Neg Hx    Esophageal cancer Neg Hx    Colon cancer Neg Hx     Allergies  Allergen Reactions   Hydrocodone  Other (See Comments), Nausea Only and Nausea And Vomiting    Current Outpatient Medications on File Prior to Visit  Medication Sig Dispense Refill   estradiol  (ESTRACE ) 0.1 MG/GM vaginal cream 1 gram vaginally twice weekly 42.5 g 3   fluticasone  (FLONASE ) 50 MCG/ACT nasal spray Place 1 spray into both nostrils daily. 16 g 11   meloxicam  (MOBIC ) 15 MG tablet Take 1 tablet (15 mg total) by mouth daily. 90 tablet 3   No current facility-administered medications on file prior to visit.    BP 100/60   Pulse 70   Temp 98.1 F (36.7 C) (Oral)   Ht 5' 4.5" (1.638 m)   Wt 130 lb (59 kg)   LMP 10/28/1995   SpO2 97%   BMI 21.97 kg/m       Objective:   Physical Exam Vitals and nursing note reviewed.  Constitutional:      Appearance: Normal appearance.  Cardiovascular:     Rate and Rhythm: Normal rate and regular rhythm.     Pulses: Normal pulses.     Heart sounds: Normal heart sounds.   Skin:    General: Skin is warm  and dry.     Findings: Erythema and rash present. Rash is vesicular.       Neurological:     General: No focal deficit present.     Mental Status: She is alert and oriented to person, place, and time.  Psychiatric:        Mood and Affect: Mood normal.        Behavior: Behavior normal.        Thought Content: Thought content normal.        Judgment: Judgment normal.        Assessment & Plan:  1. Allergic contact dermatitis due to plants, except food (Primary) - Significant poison ivy rash. Will give depo medrol  injection in the office today. Can start oral steroids tomorrow x 5 days to prevent rebound.  - methylPREDNISolone  acetate (DEPO-MEDROL ) injection 120 mg - predniSONE  (DELTASONE ) 10 MG tablet; Take 1 tablet (10 mg total) by mouth daily with breakfast.  Dispense: 5 tablet; Refill: 0  Alto Atta, NP

## 2024-02-26 NOTE — Addendum Note (Signed)
 Addended by: Twyla Galeazzi R on: 02/26/2024 08:07 AM   Modules accepted: Orders

## 2024-02-26 NOTE — Telephone Encounter (Signed)
 Pt was seen today at the office by [facility]

## 2024-03-29 DIAGNOSIS — L570 Actinic keratosis: Secondary | ICD-10-CM | POA: Diagnosis not present

## 2024-03-29 DIAGNOSIS — L814 Other melanin hyperpigmentation: Secondary | ICD-10-CM | POA: Diagnosis not present

## 2024-05-03 ENCOUNTER — Ambulatory Visit: Admitting: Sports Medicine

## 2024-05-03 VITALS — BP 94/50 | Ht 64.5 in | Wt 126.0 lb

## 2024-05-03 DIAGNOSIS — M19079 Primary osteoarthritis, unspecified ankle and foot: Secondary | ICD-10-CM | POA: Diagnosis not present

## 2024-05-03 DIAGNOSIS — M205X2 Other deformities of toe(s) (acquired), left foot: Secondary | ICD-10-CM | POA: Diagnosis not present

## 2024-05-03 NOTE — Progress Notes (Signed)
  Laura Curtis - 78 y.o. female MRN [id number]  Date of birth: 04-Jan-1946  SUBJECTIVE:  Including CC & ROS.  No chief complaint on file. Patient states that she is here today to have her toes evaluated.  She states that she is noticing on her left foot that her 3rd and 4th toe have started to cross over, as well as she starting to see the tendons in her feet.  She is noticing that her toes are changing shape slightly and she is wondering if there is anything to do about this.  She denies any injuries, trauma or pain to her bilateral feet.  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  Overlapping toe of the left third digit towards the left fourth digit Provided pad attachment to orthotics to raise third toe to prevent overlapping Also provided toe sleeves to help pull third toe into neutral position with fourth toe Patient can use both of these together or use them separately When patient placed these in her shoe, she noted that her walking felt normal and everything was comfortable. If there is continued relief, will provide with resources to obtain padding.

## 2024-05-17 DIAGNOSIS — M7061 Trochanteric bursitis, right hip: Secondary | ICD-10-CM | POA: Diagnosis not present

## 2024-05-19 DIAGNOSIS — S73191D Other sprain of right hip, subsequent encounter: Secondary | ICD-10-CM | POA: Diagnosis not present

## 2024-07-14 ENCOUNTER — Encounter: Payer: Self-pay | Admitting: Family Medicine

## 2024-07-14 ENCOUNTER — Telehealth: Payer: Self-pay | Admitting: Family Medicine

## 2024-07-14 ENCOUNTER — Ambulatory Visit (INDEPENDENT_AMBULATORY_CARE_PROVIDER_SITE_OTHER): Admitting: Family Medicine

## 2024-07-14 VITALS — BP 110/64 | HR 66 | Temp 98.4°F | Ht 64.5 in | Wt 133.0 lb

## 2024-07-14 DIAGNOSIS — U071 COVID-19: Secondary | ICD-10-CM | POA: Diagnosis not present

## 2024-07-14 MED ORDER — PROMETHAZINE-DM 6.25-15 MG/5ML PO SYRP: 5.0000 mL | ORAL_SOLUTION | Freq: Four times a day (QID) | ORAL | 0 refills | Status: DC | PRN

## 2024-07-14 NOTE — Progress Notes (Unsigned)
   Established Patient Office Visit   Subjective:  Patient ID: Laura Curtis, female    DOB: 11/21/1945  Age: 78 y.o. MRN: [id number]  Chief Complaint  Patient presents with  . Covid Positive    HPI  ROS See HPI above     Objective:     BP 110/64   Pulse 66   Temp 98.4 F (36.9 C) (Oral)   Ht 5' 4.5 (1.638 m)   Wt 133 lb (60.3 kg)   LMP 10/28/1995   SpO2 93%   BMI 22.48 kg/m  {Vitals History (Optional):23777}  Physical Exam  No results found for any visits on 07/14/24.  The ASCVD Risk score (Arnett DK, et al., 2019) failed to calculate for the following reasons:   The 2019 ASCVD risk score is only valid for ages 19 to 48    Assessment & Plan:  There are no diagnoses linked to this encounter.  No follow-ups on file.   Houston Surges, NP

## 2024-07-15 NOTE — Patient Instructions (Signed)
-  It was nice to care for you today. I hope you get to feeling better soon.  -Covid positive. Discussed about treatment plan and decided on continued supportive care. -Continue taking Tylenol, Ibuprofen , Claritin, and Flonase  for symptom management. You may add Mucinex to help with cough.  -Prescribed Promethazine -DM cough syrup to help with severe episodes of coughing and at night. Take (teaspoon) every 6 hours as needed. Caution medication does cause drowsiness. -Rest, hydrate.  -Lung sounds clear. However, if you develop chest pain or shortness of breath becomes worse at rest and difficulty breathing, call 911 or have someone drive you to the closes emergency department.  -Follow up if not improved.

## 2024-07-23 DIAGNOSIS — J0100 Acute maxillary sinusitis, unspecified: Secondary | ICD-10-CM | POA: Diagnosis not present

## 2024-09-21 DIAGNOSIS — Z4789 Encounter for other orthopedic aftercare: Secondary | ICD-10-CM | POA: Diagnosis not present

## 2024-09-21 DIAGNOSIS — M5412 Radiculopathy, cervical region: Secondary | ICD-10-CM | POA: Diagnosis not present

## 2024-09-21 DIAGNOSIS — M79642 Pain in left hand: Secondary | ICD-10-CM | POA: Diagnosis not present

## 2024-10-06 ENCOUNTER — Other Ambulatory Visit: Payer: Self-pay | Admitting: Family Medicine

## 2024-10-06 ENCOUNTER — Ambulatory Visit (INDEPENDENT_AMBULATORY_CARE_PROVIDER_SITE_OTHER): Admitting: Family Medicine

## 2024-10-06 DIAGNOSIS — Z0000 Encounter for general adult medical examination without abnormal findings: Secondary | ICD-10-CM

## 2024-10-06 MED ORDER — FLUTICASONE PROPIONATE 50 MCG/ACT NA SUSP: 1.0000 | Freq: Every day | NASAL | 11 refills | Status: AC

## 2024-10-06 NOTE — Patient Instructions (Signed)
 I really enjoyed getting to talk with you today! I am available on Tuesdays and Thursdays for virtual visits if you have any questions or concerns, or if I can be of any further assistance.   CHECKLIST FROM ANNUAL WELLNESS VISIT:  -Follow up (please call to schedule if not scheduled after visit):   -yearly for annual wellness visit with primary care office  Here is a list of your preventive care/health maintenance measures and the plan for each if any are due:  PLAN For any measures below that may be due:    1. Please provide copy of your recent vaccine record   2. Can check with Dr. Cleotilde on when to do your next bone density test  Health Maintenance  Topic Date Due   Influenza Vaccine  05/27/2024   COVID-19 Vaccine (5 - 2025-26 season) 06/27/2024   Medicare Annual Wellness (AWV)  10/06/2025   Pneumococcal Vaccine: 50+ Years  Completed   Bone Density Scan  Completed   Zoster Vaccines- Shingrix  Completed   Meningococcal B Vaccine  Aged Out   DTaP/Tdap/Td  Discontinued   Hepatitis C Screening  Discontinued   Fecal DNA (Cologuard)  Discontinued    -See a dentist at least yearly  -Get your eyes checked and then per your eye specialist's recommendations  -Other issues addressed today:   -I have included below further information regarding a healthy whole foods based diet, physical activity guidelines for adults, stress management and opportunities for social connections. I hope you find this information useful.   -----------------------------------------------------------------------------------------------------------------------------------------------------------------------------------------------------------------------------------------------------------    NUTRITION: -eat real food: lots of colorful vegetables (half the plate) and fruits -5-7 servings of vegetables and fruits per day (fresh or steamed is best), exp. 2 servings of vegetables with lunch and dinner and 2  servings of fruit per day. Berries and greens such as kale and collards are great choices.  -consume on a regular basis:  fresh fruits, fresh veggies, fish, nuts, seeds, healthy oils (such as olive oil, avocado oil), whole grains (make sure for bread/pasta/crackers/etc., that the first ingredient on label contains the word whole), legumes. -can eat small amounts of dairy and lean meat (no larger than the palm of your hand), but avoid processed meats such as ham, bacon, lunch meat, etc. -drink water -try to avoid fast food and pre-packaged foods, processed meat, ultra processed foods/beverages (donuts, candy, etc.) -most experts advise limiting sodium to < 2300mg  per day, should limit further is any chronic conditions such as high blood pressure, heart disease, diabetes, etc. The American Heart Association advised that < 1500mg  is is ideal -try to avoid foods/beverages that contain any ingredients with names you do not recognize  -try to avoid foods/beverages  with added sugar or sweeteners/sweets  -try to avoid sweet drinks (including diet drinks): soda, juice, Gatorade, sweet tea, power drinks, diet drinks -try to avoid white rice, white bread, pasta (unless whole grain)  EXERCISE GUIDELINES FOR ADULTS: -if you wish to increase your physical activity, do so gradually and with the approval of your doctor -STOP and seek medical care immediately if you have any chest pain, chest discomfort or trouble breathing when starting or increasing exercise  -move and stretch your body, legs, feet and arms when sitting for long periods -Physical activity guidelines for optimal health in adults: -get at least 150 minutes per week of moderate exercise (can talk, but not sing); this is about 20-30 minutes of sustained activity 5-7 days per week or two 10-15 minute episodes of  sustained activity 5-7 days per week -do some muscle building/resistance training/strength training at least 2 days per week  -balance  exercises 3+ days per week:   Stand somewhere where you have something sturdy to hold onto if you lose balance    1) lift up on toes, then back down, start with 5x per day and work up to 20x   2) stand and lift one leg straight out to the side so that foot is a few inches of the floor, start with 5x each side and work up to 20x each side   3) stand on one foot, start with 5 seconds each side and work up to 20 seconds on each side  If you need ideas or help with getting more active:  -Silver sneakers https://tools.silversneakers.com  -Walk with a Doc: Http://www.duncan-williams.com/  -try to include resistance (weight lifting/strength building) and balance exercises twice per week: or the following link for ideas: http://castillo-powell.com/  buyducts.dk  STRESS MANAGEMENT: -can try meditating, or just sitting quietly with deep breathing while intentionally relaxing all parts of your body for 5 minutes daily -if you need further help with stress, anxiety or depression please follow up with your primary doctor or contact the wonderful folks at [facility]: [phone]  SOCIAL CONNECTIONS: -options in [place] if you wish to engage in more social and exercise related activities:  -Silver sneakers https://tools.silversneakers.com  -Walk with a Doc: Http://www.duncan-williams.com/  -Check out the [facility] Active Adults 50+ section on the [place] [employer] companies (hiking clubs, book clubs, cards and games, chess, exercise classes, aquatic classes and much more) - see the website for details: https://www[place].gov/departments/parks-recreation/active-adults50  -YouTube has lots of exercise videos for different ages and abilities as well  -[facility] (a variety of indoor and outdoor inperson activities for adults). ([phone]) [phone]. [address].  -Virtual Online  Classes (a variety of topics): see seniorplanet.org or call ([phone]) [phone]  -consider volunteering at a school, hospice center, church, senior center or elsewhere

## 2024-10-06 NOTE — Progress Notes (Signed)
 ----------------------------------------------------------------------------------------------------------------------------------------------------------------------------------------------------------------------  Because this visit was a virtual/telehealth visit, some criteria may be missing or patient reported. Any vitals not documented were not able to be obtained and vitals that have been documented are patient reported.    MEDICARE ANNUAL PREVENTIVE VISIT WITH PROVIDER: (Welcome to Medicare, initial annual wellness or annual wellness exam)  Virtual Visit via Video Note  I connected with Laura Curtis on 10/06/2024 by a video enabled telemedicine application and verified that I am speaking with the correct person using two identifiers.  Location patient: home Location provider:work or home office Persons participating in the virtual visit: patient, provider  Concerns and/or follow up today: detailed intake and risks/health assessment completed in flowsheets and below - please see for details. No concerns currently.   How often do you have a drink containing alcohol?3 days per week seasonally How many drinks containing alcohol do you have on a typical day when you are drinking?1 drink How often do you have six or more drinks on one occasion?never Have you ever smoked?n Quit date if applicable? na How many packs a day do/did you smoke? na Do you use smokeless tobacco?n Do you use an illicit drugs?n Do you feel safe at home?n Last dentist visit?every 6 months Last eye Exam and location?goes on regular basis   See HM section in Epic for other details of completed HM.    ROS: negative for report of fevers, unintentional weight loss, vision changes, vision loss, hearing loss or change, chest pain, sob, hemoptysis, melena, hematochezia, hematuria or bleeding or bruising  Patient-completed extensive health risk assessment - reviewed and discussed with the patient: See Health  Risk Assessment completed with patient prior to the visit either above or in recent phone note. This was reviewed in detailed with the patient today and appropriate recommendations, orders and referrals were placed as needed per Summary below and patient instructions.   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   Hx of varicella    HX: benign breast biopsy    Left rotator cuff tear 01/14/2012   Supraspinatus tear noted on ultrasound. Provided instructions regarding nitroglycerin  patches 1/4 patch daily to shoulder.   To continue circular motion exercises and range of motion. No over the head exercises.  No weight bearing on that side. She can continue to play tennis as tolerated.  FU in 2 weeks if no improvement, 4 weeks if improved.       Shoulder pain, left 12/31/2011   History of rotator cuff tear on the right and now has nontraumatic left shoulder pain    Syncope    June, 2013   Uterine prolapse  Past Surgical History:  Procedure Laterality Date   APPENDECTOMY  1968   BREAST BIOPSY  1998   MOHS SURGERY     for squamous cell ca left nose   OVARIAN CYST SURGERY  1968   REPLACEMENT TOTAL KNEE Right    ROTATOR CUFF REPAIR  2001   TONSILLECTOMY AND ADENOIDECTOMY      TUBAL LIGATION      Social History   Socioeconomic History   Marital status: Married    Spouse name: Not on file   Number of children: 3   Years of education: Not on file   Highest education level: Bachelor's degree (e.g., BA, AB, BS)  Occupational History   Occupation: radiation therapist  Tobacco Use   Smoking status: Never   Smokeless tobacco: Never  Vaping Use   Vaping status: Never Used  Substance and Sexual Activity   Alcohol use: Yes    Alcohol/week: 2.0 - 3.0 standard drinks of alcohol    Types: 2 - 3 Standard drinks or equivalent per week    Comment: 5 a day   Drug use: No   Sexual activity: Not Currently    Birth control/protection: None, Surgical    Comment: BTL  Other Topics Concern   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 Sees dentist on a regular basis.    etoh 3 x per week                Social Drivers of Health   Tobacco Use: Low Risk (07/14/2024)   Patient History    Smoking Tobacco Use: Never    Smokeless Tobacco Use: Never    Passive Exposure: Not on file  Financial Resource Strain: Low Risk (07/07/2022)   Overall Financial Resource Strain (CARDIA)    Difficulty of Paying Living Expenses: Not hard at all  Food Insecurity: No Food Insecurity (08/24/2023)   Hunger Vital Sign    Worried About Running Out of Food in the Last Year: Never true    Ran Out of Food in the Last Year: Never true  Transportation Needs: No Transportation Needs (08/24/2023)   PRAPARE - Administrator, Civil Service (Medical): No    Lack of Transportation (Non-Medical): No  Physical Activity: Sufficiently Active (10/06/2024)   Exercise Vital Sign    Days of Exercise per Week: 5 days    Minutes of Exercise per Session: 60 min  Stress: No Stress Concern Present  (08/24/2023)   [employer] of Occupational Health - Occupational Stress Questionnaire    Feeling of Stress : Only a little  Social Connections: Socially Integrated (10/06/2024)   Social Connection and Isolation Panel    Frequency of Communication with Friends and Family: More than three times a week    Frequency of Social Gatherings with Friends and Family: More than three times a week    Attends Religious Services: More than 4 times per year    Active Member of Clubs or Organizations: Yes    Attends Banker Meetings: More than 4 times per year    Marital Status: Married  Catering Manager Violence: Not on file  Depression (PHQ2-9): Low Risk (10/06/2024)   Depression (PHQ2-9)  PHQ-2 Score: 0  Alcohol Screen: Low Risk (08/24/2023)   Alcohol Screen    Last Alcohol Screening Score (AUDIT): 5  Housing: Low Risk (08/24/2023)   Housing    Last Housing Risk Score: 0  Utilities: Not At Risk (07/03/2022)   AHC Utilities    Threatened with loss of utilities: No  Health Literacy: Not on file    Family History  Problem Relation Age of Onset   Other Mother        old age died 28    Deep vein thrombosis Mother    Stroke Father        age 85   Stomach cancer Neg Hx    Esophageal cancer Neg Hx    Colon cancer Neg Hx     Medications Ordered Prior to Encounter[1]  Allergies[2]     Physical Exam Vitals requested from patient and listed below if patient had equipment and was able to obtain at home for this virtual visit: There were no vitals filed for this visit. Estimated body mass index is 22.48 kg/m as calculated from the following:   Height as of 07/14/24: 5' 4.5 (1.638 m).   Weight as of 07/14/24: 133 lb (60.3 kg).  EKG (optional): deferred due to virtual visit  GENERAL: alert, oriented, no acute distress detected, full vision exam deferred due to pandemic and/or virtual encounter  HEENT: atraumatic, conjunttiva clear, no obvious abnormalities on inspection  of external nose and ears  NECK: normal movements of the head and neck  LUNGS: on inspection no signs of respiratory distress, breathing rate appears normal, no obvious gross SOB, gasping or wheezing  CV: no obvious cyanosis  MS: moves all visible extremities without noticeable abnormality  PSYCH/NEURO: pleasant and cooperative, no obvious depression or anxiety, speech and thought processing grossly intact, Cognitive function grossly intact  Flowsheet Row Office Visit from 07/14/2024 in [facility] [place]  PHQ-9 Total Score 1        10/06/2024   10:26 AM 07/14/2024    2:59 PM 02/22/2024    3:48 PM 10/01/2023   12:32 PM 08/24/2023    1:22 PM  Depression screen PHQ 2/9  Decreased Interest 0 0 0 0 0  Down, Depressed, Hopeless 0 0 0 0 0  PHQ - 2 Score 0 0 0 0 0  Altered sleeping  1   1  Tired, decreased energy  0   0  Change in appetite  0   0  Feeling bad or failure about yourself   0   0  Trouble concentrating  0   0  Moving slowly or fidgety/restless  0   0  Suicidal thoughts  0   0  PHQ-9 Score  1    1   Difficult doing work/chores  Not difficult at all        Data saved with a previous flowsheet row definition       08/24/2023    1:22 PM 10/01/2023   12:32 PM 02/22/2024    3:48 PM 07/14/2024    2:59 PM 10/06/2024   10:12 AM  Fall Risk  Falls in the past year? 0 0 0 0 0  Was there an injury with Fall? 0  0  0  0  0  Fall Risk Category Calculator 0 0 0 0 0  Patient at Risk for Falls Due to No Fall Risks  No Fall Risks No Fall Risks No Fall Risks  Fall risk Follow up Falls  evaluation completed  Falls evaluation completed Falls evaluation completed Falls evaluation completed     Data saved with a previous flowsheet row definition     SUMMARY AND PLAN:  Encounter for Medicare annual wellness exam   Discussed applicable health maintenance/preventive health measures and advised and referred or ordered per patient preferences: -she already  had flu covid vaccines and reports she will obtain record -she does bone density with Dr. Cleotilde - was normal in 2022 Health Maintenance  Topic Date Due   Influenza Vaccine  05/27/2024   COVID-19 Vaccine (5 - 2025-26 season) 06/27/2024   Medicare Annual Wellness (AWV)  10/06/2025   Pneumococcal Vaccine: 50+ Years  Completed   Bone Density Scan  Completed   Zoster Vaccines- Shingrix  Completed   Meningococcal B Vaccine  Aged Out   DTaP/Tdap/Td  Discontinued   Hepatitis C Screening  Discontinued   Fecal DNA (Cologuard)  Discontinued      Education and counseling on the following was provided based on the above review of health and a plan/checklist for the patient, along with additional information discussed, was provided for the patient in the patient instructions :  -Advised on importance of completing advanced directives, discussed options for completing and provided information in patient instructions as well -Provided counseling and plan for difficulty hearing  -Provided counseling and plan for increased risk of falling if applicable per above screening. Reviewed and demonstrated safe balance exercises that can be done at home to improve balance and discussed exercise guidelines for adults with include balance exercises at least 3 days per week.  -Advised and counseled on a healthy lifestyle - including the importance of a healthy diet, regular physical activity, social connections and stress management. -Reviewed patient's current diet. Advised and counseled on a whole foods based healthy diet. A summary of a healthy diet was provided in the Patient Instructions.  -reviewed patient's current physical activity level and discussed exercise guidelines for adults. Discussed community resources and ideas for safe exercise at home to assist in meeting exercise guideline recommendations in a safe and healthy way.  -Advise yearly dental visits at minimum and regular eye exams -Advised and  counseled on alcohol safe limits, risks/ tobacco use, risks of smoking and offered counseling/help, drug, opoid use/misuse   Follow up: see patient instructions     Patient Instructions  I really enjoyed getting to talk with you today! I am available on Tuesdays and Thursdays for virtual visits if you have any questions or concerns, or if I can be of any further assistance.   CHECKLIST FROM ANNUAL WELLNESS VISIT:  -Follow up (please call to schedule if not scheduled after visit):   -yearly for annual wellness visit with primary care office  Here is a list of your preventive care/health maintenance measures and the plan for each if any are due:  PLAN For any measures below that may be due:    1. Please provide copy of your recent vaccine record   2. Can check with Dr. Cleotilde on when to do your next bone density test  Health Maintenance  Topic Date Due   Influenza Vaccine  05/27/2024   COVID-19 Vaccine (5 - 2025-26 season) 06/27/2024   Medicare Annual Wellness (AWV)  10/06/2025   Pneumococcal Vaccine: 50+ Years  Completed   Bone Density Scan  Completed   Zoster Vaccines- Shingrix  Completed   Meningococcal B Vaccine  Aged Out   DTaP/Tdap/Td  Discontinued   Hepatitis C Screening  Discontinued  Fecal DNA (Cologuard)  Discontinued    -See a dentist at least yearly  -Get your eyes checked and then per your eye specialist's recommendations  -Other issues addressed today:   -I have included below further information regarding a healthy whole foods based diet, physical activity guidelines for adults, stress management and opportunities for social connections. I hope you find this information useful.   -----------------------------------------------------------------------------------------------------------------------------------------------------------------------------------------------------------------------------------------------------------    NUTRITION: -eat real  food: lots of colorful vegetables (half the plate) and fruits -5-7 servings of vegetables and fruits per day (fresh or steamed is best), exp. 2 servings of vegetables with lunch and dinner and 2 servings of fruit per day. Berries and greens such as kale and collards are great choices.  -consume on a regular basis:  fresh fruits, fresh veggies, fish, nuts, seeds, healthy oils (such as olive oil, avocado oil), whole grains (make sure for bread/pasta/crackers/etc., that the first ingredient on label contains the word whole), legumes. -can eat small amounts of dairy and lean meat (no larger than the palm of your hand), but avoid processed meats such as ham, bacon, lunch meat, etc. -drink water -try to avoid fast food and pre-packaged foods, processed meat, ultra processed foods/beverages (donuts, candy, etc.) -most experts advise limiting sodium to < 2300mg  per day, should limit further is any chronic conditions such as high blood pressure, heart disease, diabetes, etc. The American Heart Association advised that < 1500mg  is is ideal -try to avoid foods/beverages that contain any ingredients with names you do not recognize  -try to avoid foods/beverages  with added sugar or sweeteners/sweets  -try to avoid sweet drinks (including diet drinks): soda, juice, Gatorade, sweet tea, power drinks, diet drinks -try to avoid white rice, white bread, pasta (unless whole grain)  EXERCISE GUIDELINES FOR ADULTS: -if you wish to increase your physical activity, do so gradually and with the approval of your doctor -STOP and seek medical care immediately if you have any chest pain, chest discomfort or trouble breathing when starting or increasing exercise  -move and stretch your body, legs, feet and arms when sitting for long periods -Physical activity guidelines for optimal health in adults: -get at least 150 minutes per week of moderate exercise (can talk, but not sing); this is about 20-30 minutes of sustained  activity 5-7 days per week or two 10-15 minute episodes of sustained activity 5-7 days per week -do some muscle building/resistance training/strength training at least 2 days per week  -balance exercises 3+ days per week:   Stand somewhere where you have something sturdy to hold onto if you lose balance    1) lift up on toes, then back down, start with 5x per day and work up to 20x   2) stand and lift one leg straight out to the side so that foot is a few inches of the floor, start with 5x each side and work up to 20x each side   3) stand on one foot, start with 5 seconds each side and work up to 20 seconds on each side  If you need ideas or help with getting more active:  -Silver sneakers https://tools.silversneakers.com  -Walk with a Doc: Http://www.duncan-williams.com/  -try to include resistance (weight lifting/strength building) and balance exercises twice per week: or the following link for ideas: http://castillo-powell.com/  buyducts.dk  STRESS MANAGEMENT: -can try meditating, or just sitting quietly with deep breathing while intentionally relaxing all parts of your body for 5 minutes daily -if you need further help with stress, anxiety or  depression please follow up with your primary doctor or contact the wonderful folks at [facility]: [phone]  SOCIAL CONNECTIONS: -options in [place] if you wish to engage in more social and exercise related activities:  -Silver sneakers https://tools.silversneakers.com  -Walk with a Doc: Http://www.duncan-williams.com/  -Check out the [facility] Active Adults 50+ section on the [place] of [employer] (hiking clubs, book clubs, cards and games, chess, exercise classes, aquatic classes and much more) - see the website for details: https://www.[place].gov/departments/parks-recreation/active-adults50  -YouTube has lots of exercise  videos for different ages and abilities as well  -[facility] (a variety of indoor and outdoor inperson activities for adults). [phone]. [address].  -Virtual Online Classes (a variety of topics): see seniorplanet.org or call [phone]  -consider volunteering at a school, hospice center, church, senior center or elsewhere            Chiquita JONELLE Cramp, DO      [1]  Current Outpatient Medications on File Prior to Visit  Medication Sig Dispense Refill   estradiol  (ESTRACE ) 0.1 MG/GM vaginal cream 1 gram vaginally twice weekly 42.5 g 3   meloxicam  (MOBIC ) 15 MG tablet Take 1 tablet (15 mg total) by mouth daily. 90 tablet 3   No current facility-administered medications on file prior to visit.  [2]  Allergies Allergen Reactions   Hydrocodone  Other (See Comments), Nausea Only and Nausea And Vomiting

## 2024-10-12 DIAGNOSIS — M542 Cervicalgia: Secondary | ICD-10-CM | POA: Diagnosis not present

## 2024-10-12 DIAGNOSIS — G5602 Carpal tunnel syndrome, left upper limb: Secondary | ICD-10-CM | POA: Diagnosis not present

## 2024-10-12 DIAGNOSIS — M79642 Pain in left hand: Secondary | ICD-10-CM | POA: Diagnosis not present

## 2024-10-12 DIAGNOSIS — M5412 Radiculopathy, cervical region: Secondary | ICD-10-CM | POA: Diagnosis not present

## 2024-10-12 DIAGNOSIS — Z4789 Encounter for other orthopedic aftercare: Secondary | ICD-10-CM | POA: Diagnosis not present

## 2024-11-07 ENCOUNTER — Encounter: Payer: Self-pay | Admitting: *Deleted
